# Patient Record
Sex: Female | Born: 1965 | Race: Black or African American | Hispanic: No | Smoking: Current every day smoker
Health system: Southern US, Community
[De-identification: ages and names within clinical notes are randomized; demographics above are authoritative.]

## PROBLEM LIST (undated history)

## (undated) DIAGNOSIS — M79672 Pain in left foot: Secondary | ICD-10-CM

## (undated) DIAGNOSIS — R51 Headache: Secondary | ICD-10-CM

## (undated) DIAGNOSIS — R519 Headache, unspecified: Secondary | ICD-10-CM

## (undated) DIAGNOSIS — M542 Cervicalgia: Secondary | ICD-10-CM

## (undated) DIAGNOSIS — M25519 Pain in unspecified shoulder: Secondary | ICD-10-CM

## (undated) DIAGNOSIS — I10 Essential (primary) hypertension: Secondary | ICD-10-CM

## (undated) DIAGNOSIS — G8929 Other chronic pain: Secondary | ICD-10-CM

## (undated) DIAGNOSIS — F40240 Claustrophobia: Secondary | ICD-10-CM

## (undated) DIAGNOSIS — M549 Dorsalgia, unspecified: Secondary | ICD-10-CM

## (undated) DIAGNOSIS — J40 Bronchitis, not specified as acute or chronic: Secondary | ICD-10-CM

## (undated) DIAGNOSIS — K089 Disorder of teeth and supporting structures, unspecified: Secondary | ICD-10-CM

## (undated) DIAGNOSIS — H9191 Unspecified hearing loss, right ear: Secondary | ICD-10-CM

## (undated) DIAGNOSIS — F319 Bipolar disorder, unspecified: Secondary | ICD-10-CM

## (undated) HISTORY — PX: LIGAMENT REPAIR: SHX5444

## (undated) HISTORY — PX: CHOLECYSTECTOMY: SHX55

---

## 2003-04-19 ENCOUNTER — Emergency Department (HOSPITAL_COMMUNITY): Admission: EM | Admit: 2003-04-19 | Discharge: 2003-04-19 | Payer: Self-pay | Admitting: Emergency Medicine

## 2004-04-20 ENCOUNTER — Emergency Department (HOSPITAL_COMMUNITY): Admission: EM | Admit: 2004-04-20 | Discharge: 2004-04-21 | Payer: Self-pay | Admitting: Emergency Medicine

## 2004-06-17 ENCOUNTER — Emergency Department (HOSPITAL_COMMUNITY): Admission: EM | Admit: 2004-06-17 | Discharge: 2004-06-17 | Payer: Self-pay | Admitting: Emergency Medicine

## 2004-07-08 ENCOUNTER — Inpatient Hospital Stay (HOSPITAL_COMMUNITY): Admission: AD | Admit: 2004-07-08 | Discharge: 2004-07-08 | Payer: Self-pay | Admitting: Obstetrics and Gynecology

## 2004-07-26 ENCOUNTER — Emergency Department (HOSPITAL_COMMUNITY): Admission: EM | Admit: 2004-07-26 | Discharge: 2004-07-26 | Payer: Self-pay | Admitting: Emergency Medicine

## 2004-09-26 ENCOUNTER — Inpatient Hospital Stay (HOSPITAL_COMMUNITY): Admission: AD | Admit: 2004-09-26 | Discharge: 2004-09-29 | Payer: Self-pay | Admitting: Psychiatry

## 2004-09-26 ENCOUNTER — Ambulatory Visit: Payer: Self-pay | Admitting: Psychiatry

## 2004-11-03 ENCOUNTER — Inpatient Hospital Stay (HOSPITAL_COMMUNITY): Admission: AD | Admit: 2004-11-03 | Discharge: 2004-11-03 | Payer: Self-pay | Admitting: Obstetrics

## 2004-11-25 ENCOUNTER — Inpatient Hospital Stay (HOSPITAL_COMMUNITY): Admission: RE | Admit: 2004-11-25 | Discharge: 2004-11-30 | Payer: Self-pay | Admitting: Obstetrics

## 2004-11-30 ENCOUNTER — Inpatient Hospital Stay (HOSPITAL_COMMUNITY): Admission: AD | Admit: 2004-11-30 | Discharge: 2004-12-07 | Payer: Self-pay | Admitting: Obstetrics & Gynecology

## 2005-02-15 ENCOUNTER — Inpatient Hospital Stay (HOSPITAL_COMMUNITY): Admission: AD | Admit: 2005-02-15 | Discharge: 2005-02-18 | Payer: Self-pay | Admitting: Obstetrics

## 2005-03-30 ENCOUNTER — Observation Stay (HOSPITAL_COMMUNITY): Admission: AD | Admit: 2005-03-30 | Discharge: 2005-03-31 | Payer: Self-pay | Admitting: Obstetrics

## 2005-04-28 ENCOUNTER — Inpatient Hospital Stay (HOSPITAL_COMMUNITY): Admission: AD | Admit: 2005-04-28 | Discharge: 2005-04-28 | Payer: Self-pay | Admitting: Obstetrics & Gynecology

## 2005-08-23 ENCOUNTER — Emergency Department (HOSPITAL_COMMUNITY): Admission: EM | Admit: 2005-08-23 | Discharge: 2005-08-23 | Payer: Self-pay | Admitting: Emergency Medicine

## 2005-10-12 ENCOUNTER — Emergency Department (HOSPITAL_COMMUNITY): Admission: EM | Admit: 2005-10-12 | Discharge: 2005-10-12 | Payer: Self-pay | Admitting: Emergency Medicine

## 2005-12-12 ENCOUNTER — Emergency Department (HOSPITAL_COMMUNITY): Admission: EM | Admit: 2005-12-12 | Discharge: 2005-12-12 | Payer: Self-pay | Admitting: Emergency Medicine

## 2006-08-31 ENCOUNTER — Encounter: Admission: RE | Admit: 2006-08-31 | Discharge: 2006-08-31 | Payer: Self-pay | Admitting: Orthopedic Surgery

## 2007-04-24 ENCOUNTER — Emergency Department (HOSPITAL_COMMUNITY): Admission: EM | Admit: 2007-04-24 | Discharge: 2007-04-24 | Payer: Self-pay | Admitting: Emergency Medicine

## 2007-08-19 ENCOUNTER — Emergency Department (HOSPITAL_COMMUNITY): Admission: EM | Admit: 2007-08-19 | Discharge: 2007-08-19 | Payer: Self-pay | Admitting: Emergency Medicine

## 2007-09-03 ENCOUNTER — Emergency Department (HOSPITAL_COMMUNITY): Admission: EM | Admit: 2007-09-03 | Discharge: 2007-09-03 | Payer: Self-pay | Admitting: Emergency Medicine

## 2008-01-01 ENCOUNTER — Emergency Department (HOSPITAL_COMMUNITY): Admission: EM | Admit: 2008-01-01 | Discharge: 2008-01-01 | Payer: Self-pay | Admitting: Emergency Medicine

## 2008-06-05 ENCOUNTER — Emergency Department (HOSPITAL_COMMUNITY): Admission: EM | Admit: 2008-06-05 | Discharge: 2008-06-05 | Payer: Self-pay | Admitting: Emergency Medicine

## 2008-06-18 ENCOUNTER — Encounter: Admission: RE | Admit: 2008-06-18 | Discharge: 2008-06-18 | Payer: Self-pay | Admitting: Orthopedic Surgery

## 2009-02-20 ENCOUNTER — Emergency Department (HOSPITAL_COMMUNITY): Admission: EM | Admit: 2009-02-20 | Discharge: 2009-02-20 | Payer: Self-pay | Admitting: Emergency Medicine

## 2009-04-29 ENCOUNTER — Emergency Department (HOSPITAL_COMMUNITY): Admission: EM | Admit: 2009-04-29 | Discharge: 2009-04-29 | Payer: Self-pay | Admitting: Emergency Medicine

## 2009-06-28 ENCOUNTER — Emergency Department (HOSPITAL_COMMUNITY): Admission: EM | Admit: 2009-06-28 | Discharge: 2009-06-28 | Payer: Self-pay | Admitting: Emergency Medicine

## 2009-07-15 ENCOUNTER — Ambulatory Visit (HOSPITAL_COMMUNITY): Admission: RE | Admit: 2009-07-15 | Discharge: 2009-07-15 | Payer: Self-pay | Admitting: Orthopedic Surgery

## 2009-11-04 ENCOUNTER — Emergency Department (HOSPITAL_COMMUNITY): Admission: EM | Admit: 2009-11-04 | Discharge: 2009-11-04 | Payer: Self-pay | Admitting: Emergency Medicine

## 2010-05-24 ENCOUNTER — Emergency Department (HOSPITAL_COMMUNITY)
Admission: EM | Admit: 2010-05-24 | Discharge: 2010-05-24 | Payer: Self-pay | Source: Home / Self Care | Admitting: Emergency Medicine

## 2010-05-25 LAB — URINE MICROSCOPIC-ADD ON

## 2010-05-25 LAB — CBC
HCT: 37.4 % (ref 36.0–46.0)
Hemoglobin: 12.6 g/dL (ref 12.0–15.0)
MCH: 28.5 pg (ref 26.0–34.0)
MCHC: 33.7 g/dL (ref 30.0–36.0)
MCV: 84.6 fL (ref 78.0–100.0)
Platelets: 302 10*3/uL (ref 150–400)
RBC: 4.42 MIL/uL (ref 3.87–5.11)
RDW: 13.4 % (ref 11.5–15.5)
WBC: 5.6 10*3/uL (ref 4.0–10.5)

## 2010-05-25 LAB — DIFFERENTIAL
Basophils Absolute: 0 10*3/uL (ref 0.0–0.1)
Basophils Relative: 0 % (ref 0–1)
Eosinophils Absolute: 0.2 10*3/uL (ref 0.0–0.7)
Eosinophils Relative: 4 % (ref 0–5)
Lymphocytes Relative: 55 % — ABNORMAL HIGH (ref 12–46)
Lymphs Abs: 3.1 10*3/uL (ref 0.7–4.0)
Monocytes Absolute: 0.5 10*3/uL (ref 0.1–1.0)
Monocytes Relative: 9 % (ref 3–12)
Neutro Abs: 1.8 10*3/uL (ref 1.7–7.7)
Neutrophils Relative %: 32 % — ABNORMAL LOW (ref 43–77)

## 2010-05-25 LAB — BASIC METABOLIC PANEL
BUN: 6 mg/dL (ref 6–23)
CO2: 23 mEq/L (ref 19–32)
Calcium: 8.4 mg/dL (ref 8.4–10.5)
Chloride: 109 mEq/L (ref 96–112)
Creatinine, Ser: 0.68 mg/dL (ref 0.4–1.2)
GFR calc Af Amer: >60 mL/min (ref >60)
GFR calc non Af Amer: >60 mL/min (ref >60)
Glucose, Bld: 99 mg/dL (ref 70–99)
Potassium: 3.4 mEq/L — ABNORMAL LOW (ref 3.5–5.1)
Sodium: 139 mEq/L (ref 135–145)

## 2010-05-25 LAB — URINALYSIS, ROUTINE W REFLEX MICROSCOPIC
Bilirubin Urine: NEGATIVE
Hgb urine dipstick: NEGATIVE
Ketones, ur: NEGATIVE mg/dL
Nitrite: NEGATIVE
Protein, ur: NEGATIVE mg/dL
Specific Gravity, Urine: 1.012 (ref 1.005–1.030)
Urine Glucose, Fasting: NEGATIVE mg/dL
Urobilinogen, UA: 0.2 mg/dL (ref 0.0–1.0)
pH: 6 (ref 5.0–8.0)

## 2010-05-29 ENCOUNTER — Encounter: Payer: Self-pay | Admitting: Orthopedic Surgery

## 2010-08-11 LAB — URINALYSIS, ROUTINE W REFLEX MICROSCOPIC
Glucose, UA: NEGATIVE mg/dL
Hgb urine dipstick: NEGATIVE
Ketones, ur: >80 mg/dL — AB
Nitrite: NEGATIVE
Protein, ur: NEGATIVE mg/dL
Specific Gravity, Urine: 1.025 (ref 1.005–1.030)
Urobilinogen, UA: 4 mg/dL — ABNORMAL HIGH (ref 0.0–1.0)
pH: 6.5 (ref 5.0–8.0)

## 2010-08-11 LAB — URINE MICROSCOPIC-ADD ON

## 2010-08-11 LAB — PREGNANCY, URINE: Preg Test, Ur: NEGATIVE

## 2010-08-11 LAB — URINE CULTURE: Colony Count: 6000

## 2010-09-23 NOTE — Discharge Summary (Signed)
[name], Barbara Alvarez               ACCOUNT NO.:  [id number]   MEDICAL RECORD NO.:  [id number]          PATIENT TYPE:  INP   LOCATION:  9152                          FACILITY:  [facility]   PHYSICIAN:  Roseanna Rainbow, M.D.DATE OF BIRTH:  01-31-1966   DATE OF ADMISSION:  11/30/2004  DATE OF DISCHARGE:  12/07/2004                                 DISCHARGE SUMMARY   CHIEF COMPLAINT:  The patient is a 45 year old gravida 8, para 6, with an  intrauterine pregnancy of 29 weeks complaining of uterine contractions.   HISTORY OF PRESENT ILLNESS:  Please see the above.   ALLERGIES:  HALDOL, AMBIEN.   MEDICATIONS:  1.  Prenatal vitamins.  2.  Zoloft.  3.  Colace.   SOCIAL HISTORY:  History of cannabis use.  She denies any alcohol usage.  She reports tobacco use.   OBSTETRICAL RISK FACTORS:  History of preterm labor during the current  pregnancy and also a current history of a preterm delivery.  Possible fetal  lower extremity deformity on ultrasound.  She has a history of bipolar  disorder.   PRENATAL LABORATORY DATA:  Antibody negative, RPR nonreactive, rubella  immune, hepatitis B surface antigen negative, HIV negative.   PAST MEDICAL HISTORY:  Please see the above.  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   PAST OB/GYN HISTORY:  She has a history of an ovarian cyst.   PAST SURGICAL HISTORY:  She denies.   On pelvic examination, the cervix was soft, closed, 50% effaced, with a  presenting part high.   ASSESSMENT:  1.  Intrauterine pregnancy at 29+ weeks.  2.  Preterm uterine contractions.   PLAN:  Admission, bed rest.   HOSPITAL COURSE:  The patient was admitted.  She was given magnesium sulfate  tocolysis.  An ultrasound on July 31 showed an AGA infant with a cervical  length of 1.5 cm and dilatation of the internal os to 1.2 cm.  The magnesium  sulfate was discontinued on August 1 as the patient was felt to be  adequately tocolyzed.  She remained stable  24 hours after the magnesium was  discontinued and was at this point discharged to home.   DISCHARGE DIAGNOSES:  Intrauterine pregnancy at 29+ weeks, threatened  preterm labor.   CONDITION ON DISCHARGE:  Stable.   DIET:  Regular.   ACTIVITY:  Bed rest, pelvic rest, medications resumed.   DISPOSITION:  The patient is to follow up in the office in several days.      Roseanna Rainbow, M.D.  Electronically Signed     LAJ/MEDQ  D:  03/22/2005  T:  03/23/2005  Job:  [id number]

## 2010-09-23 NOTE — Discharge Summary (Signed)
NAMEKLOI, BRODMAN NO.:  [id number]   MEDICAL RECORD NO.:  [id number]          PATIENT TYPE:  IPS   LOCATION:  0504                          FACILITY:  [facility]   PHYSICIAN:  Geoffery Lyons, M.D.      DATE OF BIRTH:  Dec 28, 1965   DATE OF ADMISSION:  09/26/2004  DATE OF DISCHARGE:  09/29/2004                                 DISCHARGE SUMMARY   CHIEF COMPLAINT/HISTORY OF PRESENT ILLNESS:  This was the first admission to  [facility] for this 45 year old single African-  American female involuntarily committed.  She has had a history of  depression since March.  She started crying at the OB/GYN.  She was  reporting depression.  She was sent total he emergency department, endorsed  she was under a lot of stress, has 5 children.  She is 5 months pregnant.  She is unable to work.  Electricity was cut off.  She reports being  overwhelmed, some paranoia positive auditory hallucinations to end her life,  off medications since March when she found out she was pregnant.   PAST PSYCHIATRIC HISTORY:  First time in [facility].  No  previous suicide attempts.  Three to four years prior to this admission for  depression.   ALCOHOL AND DRUG HISTORY:  Denies alcohol use.  Urine drug screen positive  for marijuana.   MEDICAL HISTORY:  She is 19-weeks pregnant.   MEDICATIONS:  1.  Zoloft.  2.  Seroquel stopped in March.   PHYSICAL EXAMINATION:  Performed and failed to show any acute findings other  than evidence of the pregnancy as already stated.   LABORATORY WORKUP:  Blood chemistries, white blood cells 5.0, hemoglobin  10.6.  Blood chemistries were within normal limits.  Glucose 80.  Liver  enzymes within normal limits.  TSH 0.799.  B12 level 449.  Folate 14.9.  HIV  nonreactive.  RPR nonreactive.   MENTAL STATUS EXAM:  Reveals an alert, cooperative female.  Speech clear and  articulate.  Able to carry on a conversation.  Mood depressed.   Affect  constricted.  Thought process logical, coherent and relevant.  Positive for  hallucinations but no suicidal ideas, no delusions.  Cognition well  preserved.   ADMISSION DIAGNOSES:  AXIS I:  Major depression, rule out psychotic  features.  AXIS II:  No diagnosis.  AXIS III:  A 80-month pregnancy.  AXIS IV:  Moderate.  AXIS V:  Upon admission 30, highest global assessment of function in the  last year 65.   COURSE IN HOSPITAL:  She was admitted.  She was started in individual and  group psychotherapy.  She was given Ambien for sleep.  She was given Haldol  and Cogentin as needed.  She was placed on prenatal vitamins 1 twice a day,  Colace 100 mg twice a day, Zoloft 25 mg per day.  More information from her  physician stated that she had been diagnosed with bipolar, depression,  __________ hurting herself and the children.  Initially endorsed being  easily complained of discomfort, nausea without vomiting, irritability  and  anxiety, constipation.  She was able to talk about the stressors, increased  depression, off medications since March.  She did endorse that she is having  a difficult time with the pregnancy, has 5 children, dealing with the  children, a lot of financial stress.  The pregnancy was not planned, but she  can accept it.  She is ready for the baby to come out.  She did endorse that  has suffered depression in her 3rd and 4th pregnancies.  Endorsed she has  also been psychotic.  She has heard voices.  She was treated with Haldol.  At the time of this evaluation she was denying and minimizing.  On Sep 29, 2004 she endorsed that  she went to the bathroom successfully which released  some of the pressure.  She endorsed she was hearing voices, endorsed she has  not heard voices or had ideas to hurt herself or the children.  Overall mood  was better.  Affect was brighter, broad.  Endorsing no suicidal or homicidal  ideations.  There was a family session with the  fiancee.  They both endorsed  improvement.  Endorsed difficulty with living on a limited budget.  Overall  she felt better.  The fiancee seemed to be supportive, had no safety  concerns.  She was wanting to go home.  She said she was ready to resume her  duties as being a mom, wanting to have this pregnancy and take care of  herself.  So, we went ahead and discharged the patient to outpatient  followup.   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  AXIS II:  No diagnoses.  AXIS III:  Pregnancy.  AXIS IV:  Moderate.  AXIS V:  Upon discharge 50.   DISCHARGE MEDICATIONS:  1.  Colace 100 mg 1 twice a day.  2.  Zoloft 25 mg per day with possible increase to 50.  3.  Prenatal vitamins.   FOLLOW UP:  Donnie Aho and Chyrel Masson, the therapist.       IL/MEDQ  D:  10/14/2004  T:  10/14/2004  Job:  [id number]

## 2010-09-23 NOTE — Discharge Summary (Signed)
Barbara Alvarez, Barbara Alvarez               ACCOUNT NO.:  [id number]   MEDICAL RECORD NO.:  [id number]          PATIENT TYPE:  INP   LOCATION:  9152                          [facility]ACILITY:  [facility]   PHYSICIAN:  Charles A. Clearance Coots, M.D.DATE OF BIRTH:  1966-02-14   DATE OF ADMISSION:  11/30/2004  DATE OF DISCHARGE:  11/30/2004                                 DISCHARGE SUMMARY   ADMISSION DIAGNOSES:  1.  Twenty-eight weeks gestation.  2.  Premature cervical changes.   DISCHARGE DIAGNOSES:  1.  Twenty-eight weeks gestation.  2.  Premature cervical change.  3.  Status post magnesium sulfate tocolysis with bedrest  and supportive management.   DISPOSITION:  Discharged home undelivered at 29-weeks gestation, in good  condition.   REASON FOR ADMISSION:  A 45 year old female para 5 with estimated date of  confinement of February 15, 2005, presented with complaint of leaking fluid.  Cervical changes were noted upon examination. On informal ultrasound the  cervix was approximately 2 cm in length with dynamic changes. The decision  was made to admit the patient to the hospital for preterm cervical changes  at 28-weeks gestation.   PAST MEDICAL HISTORY:  Bipolar disorder.   MEDICATIONS:  1.  Prenatal vitamins.  2.  Zoloft.  3.  Colace.   ALLERGIES:  No known drug allergies.   SOCIAL HISTORY:  Positive history of substance abuse with marijuana.  Positive tobacco use.   PHYSICAL EXAMINATION:  GENERAL:  Well-nourished, well-developed female in no  acute distress.  VITAL SIGNS:  Afebrile, vital signs were stable. Fetal heart rate was  reassuring with external fetal monitor. Toco revealed no uterine  contractions.  ABDOMEN:  Gravid, nontender.  PELVIC:  Cervix soft, 50% effaced, well-developed lower uterine segment.   IMPRESSION:  Multiparous female at 47-weeks gestation with premature  cervical changes.   PLAN:  Admit. Bedrest and tocolytic therapy.   ADMISSION LABORATORY VALUES:  Hemoglobin 10,  hematocrit 31, white blood cell  count 5200, platelets 304,000. RPR was nonreactive.   HOSPITAL COURSE:  The patient was admitted and placed on bedrest; started on  intravenous magnesium sulfate tocolysis. She responded well to therapy.  Formal ultrasound was done on hospital day number 2 and revealed the cervix  2 to 2.3 cm in length with slightly dynamic changes and funneling.  Amniotic  fluid index was 16.5 which was within normal limits. The patient continued  to respond well to tocolysis and bedrest and was discharged home on hospital  day number 5 with no uterine contractions and stable cervical exam.   DISPOSITION:   MEDICATIONS:  Continue prenatal vitamins.   ACTIVITY:  Continued modified bedrest at home. No sexual activity.   FOLLOW UP:  In the office in 1 week. The patient was given routine  instructions for preterm labor precautions.      Charles A. Clearance Coots, M.D.  Electronically Signed     CAH/MEDQ  D:  01/20/2005  T:  01/20/2005  Job:  [id number]

## 2010-09-23 NOTE — H&P (Signed)
NAMEADYSEN, RAPHAEL               ACCOUNT NO.:  [id number]   MEDICAL RECORD NO.:  [id number]          PATIENT TYPE:  INP   LOCATION:  9145                          [facility]ACILITY:  [facility]   PHYSICIAN:  Roseanna Rainbow, M.D.DATE OF BIRTH:  12-23-1965   DATE OF ADMISSION:  02/15/2005  DATE OF DISCHARGE:                                HISTORY & PHYSICAL   CHIEF COMPLAINT:  The patient is a 45 year old para 6 with an estimated date  of confinement of October 11 with an intrauterine pregnancy at 40 weeks  complaining of uterine contractions.   HISTORY OF PRESENT ILLNESS:  Please see the above.  She denies ruptured  membranes.  She reports good fetal movement.   ANTEPARTUM COURSE:  Problems, risks:  History of bipolar disorder,  schizophrenia, multiple personality disorder, history of COPD, tobacco use,  grand multiparity, advanced maternal age, history of recreational drug use.  Also with fetal limb anomaly.   SOCIAL HISTORY:  Currently smokes less than one-half pack per day.  Does not  give any significant history of alcohol usage.  Using marijuana.   PAST MEDICAL HISTORY:  Please see the above.   MEDICATIONS:  Zoloft, albuterol, prenatal vitamins.   ALLERGIES:  No known drug allergies.   FAMILY HISTORY:  No major illnesses known.   PAST SURGICAL HISTORY:  Cholecystectomy.   PAST OB/GYN HISTORY:  In July 1990 she was delivered of a female 8 pounds 1  ounce spontaneous vaginal delivery.  In October of 1987 she was delivered of  a female 7 pounds 14 ounces full-term spontaneous vaginal delivery.  In  December of 1990 she had an elective termination.  In December of 1994 she  was delivered of a 6 pound 14 ounce female spontaneous vaginal delivery.  In  November of 1995 she was delivered of a female 7 pounds 12 ounces spontaneous  vaginal delivery.  In October of 1998 she was delivered of a female 6 pounds  14 ounces spontaneous vaginal delivery.   PRENATAL SCREENS:  Pap smear within  normal limits.  Antibody screen  negative.  Hepatitis B surface antigen negative.  HIV negative.  Urine  culture and sensitivity no growth.  Gonorrhea negative.  Chlamydia negative.  Hepatitis B surface antigen negative.  HIV nonreactive.  Ultrasound at 19  weeks 1 day there was an echogenic intracardiac focus.  Foot was deviated  and medially curved.  Fetal echocardiogram was normal.   PHYSICAL EXAMINATION:  VITAL SIGNS:  Stable, afebrile.  Fetal heart rate  120s.  ABDOMEN:  Gravid.  PELVIC:  Sterile vaginal examination:  Cervix is 4 cm dilated, 50% effaced.   ASSESSMENT:  Grand multipara with an intrauterine pregnancy at term, early  labor.   PLAN:  Admission.  Expectant management.  Possible augmentation.      Roseanna Rainbow, M.D.  Electronically Signed     LAJ/MEDQ  D:  02/17/2005  T:  02/17/2005  Job:  [id number]

## 2010-12-22 ENCOUNTER — Encounter: Payer: Self-pay | Admitting: Internal Medicine

## 2010-12-22 ENCOUNTER — Emergency Department (HOSPITAL_COMMUNITY)
Admission: EM | Admit: 2010-12-22 | Discharge: 2010-12-22 | Disposition: A | Discharge status: Home / Self Care | Payer: Medicaid Other | Attending: Emergency Medicine | Admitting: Emergency Medicine

## 2010-12-22 DIAGNOSIS — M25579 Pain in unspecified ankle and joints of unspecified foot: Secondary | ICD-10-CM | POA: Insufficient documentation

## 2010-12-22 DIAGNOSIS — G8929 Other chronic pain: Secondary | ICD-10-CM | POA: Insufficient documentation

## 2010-12-22 DIAGNOSIS — M542 Cervicalgia: Secondary | ICD-10-CM | POA: Insufficient documentation

## 2010-12-22 DIAGNOSIS — F3130 Bipolar disorder, current episode depressed, mild or moderate severity, unspecified: Secondary | ICD-10-CM | POA: Insufficient documentation

## 2010-12-22 DIAGNOSIS — M25529 Pain in unspecified elbow: Secondary | ICD-10-CM | POA: Insufficient documentation

## 2010-12-22 DIAGNOSIS — M25539 Pain in unspecified wrist: Secondary | ICD-10-CM | POA: Insufficient documentation

## 2010-12-22 DIAGNOSIS — Z79899 Other long term (current) drug therapy: Secondary | ICD-10-CM | POA: Insufficient documentation

## 2010-12-22 DIAGNOSIS — Z9181 History of falling: Secondary | ICD-10-CM | POA: Insufficient documentation

## 2010-12-22 DIAGNOSIS — M25519 Pain in unspecified shoulder: Secondary | ICD-10-CM | POA: Insufficient documentation

## 2010-12-22 LAB — URINALYSIS, ROUTINE W REFLEX MICROSCOPIC
Bilirubin Urine: NEGATIVE
Leukocytes, UA: NEGATIVE
Nitrite: NEGATIVE
Protein, ur: NEGATIVE mg/dL
Specific Gravity, Urine: 1.013 (ref 1.005–1.030)
pH: 6 (ref 5.0–8.0)

## 2010-12-22 NOTE — Progress Notes (Signed)
Emergency department asking if this patient can be seen in our clinic. She has chronic pains, depression. She has Medicaid. The physician also say that this patient was once assigned to the outpatient clinic but has never followed up. She has Medicaid and has some would not accept her. Her phone number is [phone]. I have not seen this patient personally and did not have any further information about this patient.

## 2011-01-30 ENCOUNTER — Emergency Department (HOSPITAL_COMMUNITY): Payer: Medicaid Other

## 2011-01-30 ENCOUNTER — Emergency Department (HOSPITAL_COMMUNITY)
Admission: EM | Admit: 2011-01-30 | Discharge: 2011-01-30 | Disposition: A | Discharge status: Home / Self Care | Payer: Medicaid Other | Attending: Emergency Medicine | Admitting: Emergency Medicine

## 2011-01-30 DIAGNOSIS — J40 Bronchitis, not specified as acute or chronic: Secondary | ICD-10-CM | POA: Insufficient documentation

## 2011-01-30 DIAGNOSIS — S63509A Unspecified sprain of unspecified wrist, initial encounter: Secondary | ICD-10-CM | POA: Insufficient documentation

## 2011-01-30 DIAGNOSIS — R079 Chest pain, unspecified: Secondary | ICD-10-CM | POA: Insufficient documentation

## 2011-01-30 DIAGNOSIS — X58XXXA Exposure to other specified factors, initial encounter: Secondary | ICD-10-CM | POA: Insufficient documentation

## 2011-01-30 DIAGNOSIS — F17200 Nicotine dependence, unspecified, uncomplicated: Secondary | ICD-10-CM | POA: Insufficient documentation

## 2011-02-10 LAB — POCT PREGNANCY, URINE: Preg Test, Ur: NEGATIVE

## 2011-02-10 LAB — URINE CULTURE: Colony Count: 80000

## 2011-02-10 LAB — URINALYSIS, ROUTINE W REFLEX MICROSCOPIC
Hgb urine dipstick: NEGATIVE
Ketones, ur: 15 — AB
Nitrite: NEGATIVE
Urobilinogen, UA: 4 — ABNORMAL HIGH
pH: 6

## 2011-02-10 LAB — GC/CHLAMYDIA PROBE AMP, GENITAL
Chlamydia, DNA Probe: NEGATIVE
GC Probe Amp, Genital: NEGATIVE

## 2011-02-10 LAB — WET PREP, GENITAL: Trich, Wet Prep: NONE SEEN

## 2011-02-10 LAB — RPR: RPR Ser Ql: NONREACTIVE

## 2011-02-10 LAB — URINE MICROSCOPIC-ADD ON

## 2011-03-17 ENCOUNTER — Encounter: Payer: Self-pay | Admitting: *Deleted

## 2011-03-17 ENCOUNTER — Emergency Department (HOSPITAL_COMMUNITY)
Admission: EM | Admit: 2011-03-17 | Discharge: 2011-03-17 | Disposition: A | Discharge status: Home / Self Care | Payer: Medicaid Other | Attending: Emergency Medicine | Admitting: Emergency Medicine

## 2011-03-17 ENCOUNTER — Emergency Department (HOSPITAL_COMMUNITY): Payer: Medicaid Other

## 2011-03-17 DIAGNOSIS — R42 Dizziness and giddiness: Secondary | ICD-10-CM | POA: Insufficient documentation

## 2011-03-17 DIAGNOSIS — S335XXA Sprain of ligaments of lumbar spine, initial encounter: Secondary | ICD-10-CM | POA: Insufficient documentation

## 2011-03-17 DIAGNOSIS — M545 Low back pain, unspecified: Secondary | ICD-10-CM | POA: Insufficient documentation

## 2011-03-17 DIAGNOSIS — W010XXA Fall on same level from slipping, tripping and stumbling without subsequent striking against object, initial encounter: Secondary | ICD-10-CM | POA: Insufficient documentation

## 2011-03-17 DIAGNOSIS — S39012A Strain of muscle, fascia and tendon of lower back, initial encounter: Secondary | ICD-10-CM

## 2011-03-17 DIAGNOSIS — R51 Headache: Secondary | ICD-10-CM | POA: Insufficient documentation

## 2011-03-17 DIAGNOSIS — H53149 Visual discomfort, unspecified: Secondary | ICD-10-CM | POA: Insufficient documentation

## 2011-03-17 DIAGNOSIS — F319 Bipolar disorder, unspecified: Secondary | ICD-10-CM | POA: Insufficient documentation

## 2011-03-17 DIAGNOSIS — R110 Nausea: Secondary | ICD-10-CM | POA: Insufficient documentation

## 2011-03-17 HISTORY — DX: Bipolar disorder, unspecified: F31.9

## 2011-03-17 LAB — POCT I-STAT 3, VENOUS BLOOD GAS (G3P V)
Bicarbonate: 24.1 mEq/L — ABNORMAL HIGH (ref 20.0–24.0)
O2 Saturation: 52 %
Patient temperature: 98.2
TCO2: 25 mmol/L (ref 0–100)
pCO2, Ven: 38.2 mmHg — ABNORMAL LOW (ref 45.0–50.0)
pO2, Ven: 27 mmHg — CL (ref 30.0–45.0)

## 2011-03-17 LAB — CARBOXYHEMOGLOBIN: Carboxyhemoglobin: 5 % (ref 0.5–1.5)

## 2011-03-17 MED ORDER — IBUPROFEN 800 MG PO TABS: 800.0000 mg | ORAL_TABLET | Freq: Three times a day (TID) | ORAL | Status: AC

## 2011-03-17 MED ORDER — IBUPROFEN 800 MG PO TABS: 800.0000 mg | ORAL_TABLET | Freq: Three times a day (TID) | ORAL | Status: DC

## 2011-03-17 MED ORDER — METOCLOPRAMIDE HCL 5 MG/ML IJ SOLN
10.0000 mg | Freq: Once | INTRAMUSCULAR | Status: AC
  Administered 2011-03-17: 10 mg via INTRAMUSCULAR
  Filled 2011-03-17: qty 2

## 2011-03-17 MED ORDER — KETOROLAC TROMETHAMINE 60 MG/2ML IM SOLN
60.0000 mg | Freq: Once | INTRAMUSCULAR | Status: AC
  Administered 2011-03-17: 60 mg via INTRAMUSCULAR
  Filled 2011-03-17: qty 2

## 2011-03-17 MED ORDER — DIAZEPAM 5 MG PO TABS: 5.0000 mg | ORAL_TABLET | Freq: Two times a day (BID) | ORAL | Status: AC

## 2011-03-17 NOTE — ED Notes (Signed)
Patient states she continues to have a severe headache,  Pounding.  Will inform erpa

## 2011-03-17 NOTE — ED Notes (Signed)
Pt reports headache x 1.5 months with worsening symptoms yesterday. Pt reports photosensitivity and mild nausea and dizziness. States she thinks it is from the mold in her house. Pt reports lower back pain since last Tuesday, states twisted back while giving son a bath.

## 2011-03-17 NOTE — ED Notes (Signed)
Took patient a warm blanket

## 2011-03-17 NOTE — ED Provider Notes (Signed)
History     CSN: [id number] Arrival date & time: 03/17/2011  8:06 AM   First MD Initiated Contact with Patient 03/17/11 0827      Chief Complaint  Patient presents with  . Headache    (Consider location/radiation/quality/duration/timing/severity/associated sxs/prior treatment) HPI History provided by pt.   Pt reports that she has had intermittent, severe frontal headaches x 1.5 months.  Associated w/ phobophobia, dizziness upon standing and nausea.  Denies trauma and no prior h/o headaches.  Concerned that it may be related to mold in home because children have had headaches as well.  Denies nasal congestion, rhinorrhea, sore throat.  Uses space heaters only at home.  Denies fever, vision changes, extremity weakness, paresthesias and confusion.  Has also had pain in lower back for the past few days, since slipping on water and nearly falling.  No relief w/ ibuprofen.  Ambulatory.  Past Medical History  Diagnosis Date  . Bipolar 1 disorder     History reviewed. No pertinent past surgical history.  History reviewed. No pertinent family history.  History  Substance Use Topics  . Smoking status: Current Everyday Smoker    Types: Cigarettes  . Smokeless tobacco: Never Used  . Alcohol Use: Yes    OB History    Grav Para Term Preterm Abortions TAB SAB Ect Mult Living                  Review of Systems  All other systems reviewed and are negative.    Allergies  Haldol decanoate  Home Medications   Current Outpatient Rx  Name Route Sig Dispense Refill  . ALBUTEROL SULFATE HFA 108 (90 BASE) MCG/ACT IN AERS Inhalation Inhale 2 puffs into the lungs every 6 (six) hours as needed. For shortness of breath     . IBUPROFEN 200 MG PO TABS Oral Take 600 mg by mouth every 6 (six) hours as needed. For pain       BP 141/86  Pulse 58  Temp(Src) 98.2 F (36.8 C) (Oral)  Resp 14  SpO2 98%  Physical Exam  Nursing note and vitals reviewed. Constitutional: She is oriented to  person, place, and time. She appears well-developed and well-nourished. No distress.  HENT:  Head: Normocephalic and atraumatic. No trismus in the jaw.  Right Ear: Tympanic membrane, external ear and ear canal normal.  Left Ear: Tympanic membrane, external ear and ear canal normal.  Mouth/Throat: Uvula is midline and mucous membranes are normal. No oropharyngeal exudate, posterior oropharyngeal edema or posterior oropharyngeal erythema.  Eyes:       Pt will not open her eyes initially because "too painful" but when distracted, she looks directly into ceiling light.   Neck: Normal range of motion. Neck supple.       No meningeal signs  Cardiovascular: Normal rate and regular rhythm.   Pulmonary/Chest: Effort normal and breath sounds normal.  Musculoskeletal: Normal range of motion.       Diffuse lower back ttp  Lymphadenopathy:    She has no cervical adenopathy.  Neurological: She is alert and oriented to person, place, and time. She has normal reflexes. She displays normal reflexes. No cranial nerve deficit or sensory deficit. She displays a negative Romberg sign. Coordination and gait normal.       5/5 and equal upper and lower extremity strength.  No past pointing.  No pronator drift.    Skin: Skin is warm and dry. No rash noted.  Psychiatric: She has a normal mood and  affect. Her behavior is normal.    ED Course  Procedures (including critical care time)  Labs Reviewed  CARBOXYHEMOGLOBIN - Abnormal; Notable for the following:    Carboxyhemoglobin 5.0 (*)    All other components within normal limits  POCT I-STAT 3, BLOOD GAS (G3P V) - Abnormal; Notable for the following:    pH, Ven 7.407 (*)    pCO2, Ven 38.2 (*)    pO2, Ven 27.0 (*)    Bicarbonate 24.1 (*)    All other components within normal limits   Ct Head Wo Contrast  03/17/2011  *RADIOLOGY REPORT*  Clinical Data: Headache, weakness  CT HEAD WITHOUT CONTRAST  Technique:  Contiguous axial images were obtained from the base  of the skull through the vertex without contrast.  Comparison: None.  Findings: No skull fracture is noted.  Paranasal sinuses and mastoid air cells are unremarkable.  No intracranial hemorrhage, mass effect or midline shift.  No acute infarction.  No mass lesion is noted on this unenhanced scan.  No intra or extra-axial fluid collection.  The gray and white matter differentiation is preserved.  IMPRESSION: No acute intracranial abnormality.  Original Report Authenticated By: Natasha Mead, M.D.     1. Headache   2. Lumbar strain       MDM  Pt presents w/ c/o headache.  No prior history.  Several others in home w/ same.  No sx suggestive of sinusitis.  Afebrile, no focal neuro deficits or meningeal signs on exam.  CT head neg.  Carboxyhemoglobin neg as well.  VBG collected by phlebotomy in error.  All results discussed w/ pt.  Sx may be related to mold in home.  Recommended that she contact the health dept if her Landlord isn't complying w/ health codes.  Also c/o low back pain.  Onset several days ago when slipped in bathroom.  Did not fall.  No red flag s/sx.  Pt received toradol in ED for back pain and headache and reports no relief.   Ordered 10mg  IM reglan and discharged home w/ ibuprofen and valium.  Return precautions discussed.        Otilio Miu, [place] 03/17/11 1826

## 2011-03-17 NOTE — ED Notes (Signed)
See triage note. Pt reports her house in condemned because of the mold. Pt reports

## 2011-03-17 NOTE — ED Notes (Signed)
Patient has headache since yesterday.  She states she has to have help to take care of herself due to weakness.  She states her eyes can't stay open.  Patient also complains of back pain.  She states she has to tip toe.  She states she has mold in her house and she is concerned that this might be causing her sx

## 2011-03-23 NOTE — ED Provider Notes (Signed)
Medical screening examination/treatment/procedure(s) were performed by non-physician practitioner and as supervising physician I was immediately available for consultation/collaboration.  Raeford Razor, MD 03/23/11 [phone]

## 2011-06-21 ENCOUNTER — Other Ambulatory Visit: Payer: Self-pay | Admitting: Orthopaedic Surgery

## 2011-06-21 DIAGNOSIS — M545 Low back pain: Secondary | ICD-10-CM

## 2011-06-21 DIAGNOSIS — M25531 Pain in right wrist: Secondary | ICD-10-CM

## 2011-06-27 ENCOUNTER — Emergency Department (HOSPITAL_COMMUNITY)
Admission: EM | Admit: 2011-06-27 | Discharge: 2011-06-28 | Disposition: A | Discharge status: Home / Self Care | Payer: Medicaid Other | Attending: Emergency Medicine | Admitting: Emergency Medicine

## 2011-06-27 ENCOUNTER — Other Ambulatory Visit: Payer: Self-pay | Admitting: Orthopaedic Surgery

## 2011-06-27 DIAGNOSIS — I10 Essential (primary) hypertension: Secondary | ICD-10-CM | POA: Insufficient documentation

## 2011-06-27 DIAGNOSIS — T7840XA Allergy, unspecified, initial encounter: Secondary | ICD-10-CM

## 2011-06-27 DIAGNOSIS — M545 Low back pain: Secondary | ICD-10-CM

## 2011-06-27 DIAGNOSIS — R220 Localized swelling, mass and lump, head: Secondary | ICD-10-CM | POA: Insufficient documentation

## 2011-06-27 DIAGNOSIS — M25531 Pain in right wrist: Secondary | ICD-10-CM

## 2011-06-27 DIAGNOSIS — Y92009 Unspecified place in unspecified non-institutional (private) residence as the place of occurrence of the external cause: Secondary | ICD-10-CM | POA: Insufficient documentation

## 2011-06-27 DIAGNOSIS — Z79899 Other long term (current) drug therapy: Secondary | ICD-10-CM | POA: Insufficient documentation

## 2011-06-27 DIAGNOSIS — F319 Bipolar disorder, unspecified: Secondary | ICD-10-CM | POA: Insufficient documentation

## 2011-06-27 MED ORDER — PREDNISONE 20 MG PO TABS
40.0000 mg | ORAL_TABLET | Freq: Once | ORAL | Status: AC
  Administered 2011-06-27: 40 mg via ORAL
  Filled 2011-06-27: qty 2

## 2011-06-27 NOTE — ED Provider Notes (Signed)
History     CSN: [id number]  Arrival date & time 06/27/11  2248   First MD Initiated Contact with Patient 06/27/11 2258      No chief complaint on file.   (Consider location/radiation/quality/duration/timing/severity/associated sxs/prior treatment) HPI Comments: 46 year old female with a history of bipolar disorder who currently takes albuterol, Benadryl, epinephrine pen and ibuprofen who presents with a complaint of tongue swelling. She states that she woke earlier this morning with swelling of her tongue and a feeling of difficulty swallowing. She took an epinephrine pen but has not had any improvement in the swelling in her tongue. She believes this started after she awoke with a bug bite on the dorsum of the left forearm. Symptoms are persistent, moderate, nothing makes better or worse, not associated with fever chills nausea vomiting and she has had no rashes, redness, swelling of the arms or legs, change in vision or voice.  The history is provided by the patient.    Past Medical History  Diagnosis Date  . Bipolar 1 disorder     No past surgical history on file.  No family history on file.  History  Substance Use Topics  . Smoking status: Current Everyday Smoker    Types: Cigarettes  . Smokeless tobacco: Never Used  . Alcohol Use: Yes    OB History    Grav Para Term Preterm Abortions TAB SAB Ect Mult Living                  Review of Systems  All other systems reviewed and are negative.    Allergies  Haldol decanoate  Home Medications   Current Outpatient Rx  Name Route Sig Dispense Refill  . ALBUTEROL SULFATE HFA 108 (90 BASE) MCG/ACT IN AERS Inhalation Inhale 2 puffs into the lungs every 6 (six) hours as needed. For shortness of breath     . DIPHENHYDRAMINE HCL 25 MG PO CAPS Oral Take 25 mg by mouth every 6 (six) hours as needed.    Marland Kitchen EPINEPHRINE 0.15 MG/0.3ML IJ DEVI Intramuscular Inject 0.15 mg into the muscle as needed.    . IBUPROFEN 200 MG PO TABS  Oral Take 600 mg by mouth every 8 (eight) hours as needed. For pain    . PREDNISONE 20 MG PO TABS Oral Take 2 tablets (40 mg total) by mouth daily. 10 tablet 0    BP 119/53  Pulse 76  Resp 18  SpO2 100%  Physical Exam  Nursing note and vitals reviewed. Constitutional: She appears well-developed and well-nourished. No distress.  HENT:  Head: Normocephalic and atraumatic.  Mouth/Throat: Oropharynx is clear and moist. No oropharyngeal exudate.       Tongue is normal in appearance without angioedema or any swelling. Oropharynx is clear, no asymmetry hypertrophy exudates or erythema  Eyes: Conjunctivae and EOM are normal. Pupils are equal, round, and reactive to light. Right eye exhibits no discharge. Left eye exhibits no discharge. No scleral icterus.  Neck: Normal range of motion. Neck supple. No JVD present. No thyromegaly present.  Cardiovascular: Normal rate, regular rhythm, normal heart sounds and intact distal pulses.  Exam reveals no gallop and no friction rub.   No murmur heard. Pulmonary/Chest: Effort normal and breath sounds normal. No respiratory distress. She has no wheezes. She has no rales.  Abdominal: Soft. Bowel sounds are normal. She exhibits no distension and no mass. There is no tenderness.  Musculoskeletal: Normal range of motion. She exhibits no edema and no tenderness.  Lymphadenopathy:  She has no cervical adenopathy.  Neurological: She is alert. Coordination normal.  Skin: Skin is warm and dry. No rash noted. No erythema.  Psychiatric: She has a normal mood and affect. Her behavior is normal.    ED Course  Procedures (including critical care time)  Labs Reviewed - No data to display No results found.   1. Allergic reaction       MDM  Patient is well-appearing, there is no signs of redness or swelling for cellulitis or urticaria to the extremities including the location of which she describes as a bug bite. She has no swelling of her tongue, there are  no objective findings of anaphylaxis or other significant allergic reaction. She is not on any Ace inhibitors, prednisone ordered, patient appears stable for short observation and eventual followup.  After observation, pt has remained stable in appearance with O2 sat of 100%on ra, no swelling of the OP or the tongue, pruritis or urticaria.  Will d/c home with f/u.       Vida Roller, MD 06/30/11 Ebony Cargo

## 2011-06-27 NOTE — ED Notes (Signed)
Not able to get temp at this time patient per patient can't open her mouth
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
## 2011-06-28 ENCOUNTER — Other Ambulatory Visit: Payer: Medicaid Other

## 2011-06-28 MED ORDER — PREDNISONE 20 MG PO TABS: 20.0000 mg | ORAL_TABLET | Freq: Every day | ORAL | Status: DC

## 2011-06-28 MED ORDER — PREDNISONE 20 MG PO TABS: 40.0000 mg | ORAL_TABLET | Freq: Every day | ORAL | Status: AC

## 2011-06-28 NOTE — Discharge Instructions (Signed)
Allergic Reaction, Mild to Moderate Allergies may happen from anything your body is sensitive to. This may be food, medications, pollens, chemicals, and nearly anything around you in everyday life that produces allergens. An allergen is anything that causes an allergy producing substance. Allergens cause your body to release allergic antibodies. Through a chain of events, they cause a release of histamine into the blood stream. Histamines are meant to protect you, but they also cause your discomfort. This is why antihistamines are often used for allergies. Heredity is often a factor in causing allergic reactions. This means you may have some of the same allergies as your parents. Allergies happen in all age groups. You may have some idea of what caused your reaction. There are many allergens around us. It may be difficult to know what caused your reaction. If this is a first time event, it may never happen again. Allergies cannot be cured but can be controlled with medications. SYMPTOMS  You may get some or all of the following problems from allergies.  Swelling and itching in and around the mouth.   Tearing, itchy eyes.   Nasal congestion and runny nose.   Sneezing and coughing.   An itchy red rash or hives.   Vomiting or diarrhea.   Difficulty breathing.  Seasonal allergies occur in all age groups. They are seasonal because they usually occur during the same season every year. They may be a reaction to molds, grass pollens, or tree pollens. Other causes of allergies are house dust mite allergens, pet dander and mold spores. These are just a common few of the thousands of allergens around us. All of the symptoms listed above happen when you come in contact with pollens and other allergens. Seasonal allergies are usually not life threatening. They are generally more of a nuisance that can often be handled using medications. Hay fever is a combination of all or some of the above listed allergy  problems. It may often be treated with simple over-the-counter medications such as diphenhydramine. Take medication as directed. Check with your caregiver or package insert for child dosages. TREATMENT AND HOME CARE INSTRUCTIONS If hives or rash are present:  Take medications as directed.   You may use an over-the-counter antihistamine (diphenhydramine) for hives and itching as needed. Do not drive or drink alcohol until medications used to treat the reaction have worn off. Antihistamines tend to make people sleepy.   Apply cold cloths (compresses) to the skin or take baths in cool water. This will help itching. Avoid hot baths or showers. Heat will make a rash and itching worse.   If your allergies persist and become more severe, and over the counter medications are not effective, there are many new medications your caretaker can prescribe. Immunotherapy or desensitizing injections can be used if all else fails. Follow up with your caregiver if problems continue.  SEEK MEDICAL CARE IF:   Your allergies are becoming progressively more troublesome.   You suspect a food allergy. Symptoms generally happen within 30 minutes of eating a food.   Your symptoms have not gone away within 2 days or are getting worse.   You develop new symptoms.   You want to retest yourself or your child with a food or drink you think causes an allergic reaction. Never test yourself or your child of a suspected allergy without being under the watchful eye of your caregivers. A second exposure to an allergen may be life-threatening.  SEEK IMMEDIATE MEDICAL CARE IF:  You   develop difficulty breathing or wheezing, or have a tight feeling in your chest or throat.   You develop a swollen mouth, hives, swelling, or itching all over your body.  A severe reaction with any of the above problems should be considered life-threatening. If you suddenly develop difficulty breathing call for local emergency medical help. THIS IS AN  EMERGENCY. MAKE SURE YOU:   Understand these instructions.   Will watch your condition.   Will get help right away if you are not doing well or get worse.  Document Released: 02/19/2007 Document Revised: 01/04/2011 Document Reviewed: 02/19/2007 [facility] Patient Information 2012 [place], [place].  RESOURCE GUIDE  Dental Problems  Patients with Medicaid: [facility] [phone] [address]                                           [phone] [address] Phone:  [phone]                                                  Phone:  [phone]  If unable to pay or uninsured, contact:  [facility] or [facility]. to become qualified for the adult dental clinic.  Chronic Pain Problems Contact [facility]  [phone] Patients need to be referred by their primary care doctor.  Insufficient Money for Medicine Contact [employer]:  call "211" or [facility] 575-772-8282.  No Primary Care Doctor Call [facility]  [phone] Other agencies that provide inexpensive medical care    [facility]  [phone]    [facility]  [phone]    [facility]  [phone]    [facility]  [phone]    [facility]  [phone]    [facility]  [phone]  Psychological Services [facility]  [phone] [facility]  [phone] [facility]   [phone] (emergency services [phone])  Substance Abuse Resources [facility]  [phone] [facility] [phone] [facility] [phone] Floydene Flock 7703128429 Residential & Outpatient Substance Abuse Program  5878639079  Abuse/Neglect [facility] Child Abuse Hotline (410) 164-5580 [facility] Child Abuse Hotline (671)344-0484 (After Hours)  Emergency Shelter [facility] (239) 674-3727  [facility] (249) 288-9045 [facility] (705)052-7000  MRSA Hotline #:   939-458-9350    [facility] Resources  [facility]     [employer]                          [facility] Dept[facility] [address] [place]                       [address]      [address]  [address]  Sela Hua Phone:  [phone]                                   Phone:  [phone]                 Phone:  [place] Phone:  [place] [phone] (After Hours)

## 2011-07-06 ENCOUNTER — Ambulatory Visit
Admission: RE | Admit: 2011-07-06 | Discharge: 2011-07-06 | Disposition: A | Discharge status: Home / Self Care | Payer: Medicaid Other | Source: Ambulatory Visit | Attending: Orthopaedic Surgery | Admitting: Orthopaedic Surgery

## 2011-07-06 DIAGNOSIS — M25531 Pain in right wrist: Secondary | ICD-10-CM

## 2011-07-06 DIAGNOSIS — M545 Low back pain: Secondary | ICD-10-CM

## 2011-07-17 ENCOUNTER — Emergency Department (HOSPITAL_COMMUNITY): Payer: No Typology Code available for payment source

## 2011-07-17 ENCOUNTER — Encounter (HOSPITAL_COMMUNITY): Payer: Self-pay | Admitting: *Deleted

## 2011-07-17 ENCOUNTER — Emergency Department (HOSPITAL_COMMUNITY)
Admission: EM | Admit: 2011-07-17 | Discharge: 2011-07-17 | Disposition: A | Discharge status: Home / Self Care | Payer: No Typology Code available for payment source | Attending: Emergency Medicine | Admitting: Emergency Medicine

## 2011-07-17 DIAGNOSIS — T1490XA Injury, unspecified, initial encounter: Secondary | ICD-10-CM | POA: Insufficient documentation

## 2011-07-17 DIAGNOSIS — M542 Cervicalgia: Secondary | ICD-10-CM | POA: Insufficient documentation

## 2011-07-17 DIAGNOSIS — R079 Chest pain, unspecified: Secondary | ICD-10-CM | POA: Insufficient documentation

## 2011-07-17 DIAGNOSIS — M549 Dorsalgia, unspecified: Secondary | ICD-10-CM | POA: Insufficient documentation

## 2011-07-17 DIAGNOSIS — R51 Headache: Secondary | ICD-10-CM | POA: Insufficient documentation

## 2011-07-17 DIAGNOSIS — M25519 Pain in unspecified shoulder: Secondary | ICD-10-CM | POA: Insufficient documentation

## 2011-07-17 DIAGNOSIS — IMO0002 Reserved for concepts with insufficient information to code with codable children: Secondary | ICD-10-CM | POA: Insufficient documentation

## 2011-07-17 DIAGNOSIS — F411 Generalized anxiety disorder: Secondary | ICD-10-CM | POA: Insufficient documentation

## 2011-07-17 HISTORY — DX: Claustrophobia: F40.240

## 2011-07-17 HISTORY — DX: Bronchitis, not specified as acute or chronic: J40

## 2011-07-17 MED ORDER — CYCLOBENZAPRINE HCL 10 MG PO TABS: 10.0000 mg | ORAL_TABLET | Freq: Two times a day (BID) | ORAL | Status: DC | PRN

## 2011-07-17 MED ORDER — OXYCODONE-ACETAMINOPHEN 5-325 MG PO TABS
1.0000 | ORAL_TABLET | ORAL | Status: DC | PRN
Start: 2011-07-17 — End: 2011-07-20

## 2011-07-17 MED ORDER — CYCLOBENZAPRINE HCL 10 MG PO TABS
10.0000 mg | ORAL_TABLET | Freq: Once | ORAL | Status: AC
  Administered 2011-07-17: 10 mg via ORAL
  Filled 2011-07-17: qty 1

## 2011-07-17 MED ORDER — OXYCODONE-ACETAMINOPHEN 5-325 MG PO TABS
1.0000 | ORAL_TABLET | Freq: Once | ORAL | Status: AC
  Administered 2011-07-17: 1 via ORAL
  Filled 2011-07-17: qty 1

## 2011-07-17 MED ORDER — IBUPROFEN 800 MG PO TABS
800.0000 mg | ORAL_TABLET | Freq: Once | ORAL | Status: AC
Start: 2011-07-17 — End: 2011-07-17
  Administered 2011-07-17: 800 mg via ORAL
  Filled 2011-07-17: qty 1
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
## 2011-07-17 NOTE — Discharge Instructions (Signed)
Barbara Alvarez CT of your neck and  head  showed no abnormalities today. The right shoulder are also is normal. Take the Percocet and muscle relaxer but do not drop with these medications. Along with the other medications take ibuprofen 800mg  every 6 hours x24 with food.  You'll be sore for about a week. Use ice to the sore areas for the next 24 hours intermittently. All the new PCP this week. Return to the ER to have worsening symptoms shortness of breath chest pain.
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
## 2011-07-20 ENCOUNTER — Emergency Department (HOSPITAL_COMMUNITY)
Admission: EM | Admit: 2011-07-20 | Discharge: 2011-07-20 | Disposition: A | Discharge status: Home / Self Care | Payer: No Typology Code available for payment source | Attending: Emergency Medicine | Admitting: Emergency Medicine

## 2011-07-20 ENCOUNTER — Encounter (HOSPITAL_COMMUNITY): Payer: Self-pay | Admitting: *Deleted

## 2011-07-20 DIAGNOSIS — M25511 Pain in right shoulder: Secondary | ICD-10-CM

## 2011-07-20 DIAGNOSIS — F40298 Other specified phobia: Secondary | ICD-10-CM | POA: Insufficient documentation

## 2011-07-20 DIAGNOSIS — F319 Bipolar disorder, unspecified: Secondary | ICD-10-CM | POA: Insufficient documentation

## 2011-07-20 DIAGNOSIS — M436 Torticollis: Secondary | ICD-10-CM | POA: Insufficient documentation

## 2011-07-20 DIAGNOSIS — F17200 Nicotine dependence, unspecified, uncomplicated: Secondary | ICD-10-CM | POA: Insufficient documentation

## 2011-07-20 MED ORDER — DIAZEPAM 5 MG PO TABS: 10.0000 mg | ORAL_TABLET | Freq: Three times a day (TID) | ORAL | Status: DC | PRN

## 2011-07-20 MED ORDER — DIAZEPAM 5 MG PO TABS
10.0000 mg | ORAL_TABLET | Freq: Once | ORAL | Status: AC
  Administered 2011-07-20: 10 mg via ORAL
  Filled 2011-07-20: qty 2

## 2011-07-20 MED ORDER — DIAZEPAM 10 MG PO TABS: 10.0000 mg | ORAL_TABLET | Freq: Three times a day (TID) | ORAL | Status: AC | PRN
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
## 2011-07-20 NOTE — ED Provider Notes (Signed)
History/physical exam/procedure(s) were performed by non-physician practitioner and as supervising physician I was immediately available for consultation/collaboration. I have reviewed all notes and am in agreement with care and plan.   Hilario Quarry, MD 07/20/11 [phone]
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
## 2011-07-20 NOTE — Discharge Instructions (Signed)
You have a lot of spasm in your neck muscles. This is called Torticollis. Applying ice to the muscles will give temporary relief. You should continue taking your Ibuprofen three times a day, and continue taking the Percocet as needed for pain. Stop taking the Cyclobenzaprine - I am prescribing Valium which is a stronger muscle relaxer.  Torticollis, Acute You have suddenly (acutely) developed a twisted neck (torticollis). This is usually a self-limited condition. CAUSES  Acute torticollis may be caused by malposition, trauma or infection. Most commonly, acute torticollis is caused by sleeping in an awkward position. Torticollis may also be caused by the flexion, extension or twisting of the neck muscles beyond their normal position. Sometimes, the exact cause may not be known. SYMPTOMS  Usually, there is pain and limited movement of the neck. Your neck may twist to one side. DIAGNOSIS  The diagnosis is often made by physical examination. X-rays, CT scans or MRIs may be done if there is a history of trauma or concern of infection. TREATMENT  For a common, stiff neck that develops during sleep, treatment is focused on relaxing the contracted neck muscle. Medications (including shots) may be used to treat the problem. Most cases resolve in several days. Torticollis usually responds to conservative physical therapy. If left untreated, the shortened and spastic neck muscle can cause deformities in the face and neck. Rarely, surgery is required. HOME CARE INSTRUCTIONS   Use over-the-counter and prescription medications as directed by your caregiver.   Do stretching exercises and massage the neck as directed by your caregiver.   Follow up with physical therapy if needed and as directed by your caregiver.  SEEK IMMEDIATE MEDICAL CARE IF:   You develop difficulty breathing or noisy breathing (stridor).   You drool, develop trouble swallowing or have pain with swallowing.   You develop numbness or  weakness in the hands or feet.   You have changes in speech or vision.   You have problems with urination or bowel movements.   You have difficulty walking.   You have a fever.   You have increased pain.  MAKE SURE YOU:   Understand these instructions.   Will watch your condition.   Will get help right away if you are not doing well or get worse.  Document Released: 04/21/2000 Document Revised: 04/13/2011 Document Reviewed: 06/02/2009 [facility] Patient Information 2012 [place], [place].  Diazepam tablets What is this medicine? DIAZEPAM (dye AZ e pam) is a benzodiazepine. It is used to treat anxiety and nervousness. It also can help treat alcohol withdrawal, relax muscles, and treat certain types of seizures. This medicine may be used for other purposes; ask your health care provider or pharmacist if you have questions. What should I tell my health care provider before I take this medicine? They need to know if you have any of these conditions -an alcohol or drug abuse problem -bipolar disorder, depression, psychosis or other mental health condition -glaucoma -kidney or liver disease -lung or breathing disease -myasthenia gravis -Parkinson's disease -seizures or a history of seizures -suicidal thoughts -an unusual or allergic reaction to diazepam, other benzodiazepines, foods, dyes, or preservatives -pregnant or trying to get pregnant -breast-feeding How should I use this medicine? Take this medicine by mouth with a glass of water. Follow the directions on the prescription label. If this medicine upsets your stomach, take it with food or milk. Take your doses at regular intervals. Do not take your medicine more often than directed. If you have been taking this  medicine regularly for some time, do not suddenly stop taking it. You must gradually reduce the dose or you may get severe side effects. Ask your doctor or health care professional for advice. Even after you stop taking  this medicine it can still affect your body for several days. Talk to your pediatrician regarding the use of this medicine in children. Special care may be needed. Overdosage: If you think you have taken too much of this medicine contact a poison control center or emergency room at once. NOTE: This medicine is only for you. Do not share this medicine with others. What if I miss a dose? If you miss a dose, take it as soon as you can. If it is almost time for your next dose, take only that dose. Do not take double or extra doses. What may interact with this medicine? -cimetidine -grapefruit juice -herbal or dietary supplements like kava kava, melatonin, St. John's Wort, or valerian -medicines for anxiety or sleeping problems, like alprazolam, lorazepam, or triazolam -medicines for depression, mental problems or psychiatric disturbances -medicines for HIV infection or AIDS -prescription pain medicines -rifampin, rifapentine, or rifabutin -some medicines for seizures like carbamazepine, phenobarbital, phenytoin, or primidone This list may not describe all possible interactions. Give your health care provider a list of all the medicines, herbs, non-prescription drugs, or dietary supplements you use. Also tell them if you smoke, drink alcohol, or use illegal drugs. Some items may interact with your medicine. What should I watch for while using this medicine? Visit your doctor or health care professional for regular checks on your progress. Your body can become dependent on this medicine. Ask your doctor or health care professional if you still need to take it. You may get drowsy or dizzy. Do not drive, use machinery, or do anything that needs mental alertness until you know how this medicine affects you. To reduce the risk of dizzy and fainting spells, do not stand or sit up quickly, especially if you are an older patient. Alcohol may increase dizziness and drowsiness. Avoid alcoholic drinks. Do not  treat yourself for coughs, colds or allergies without asking your doctor or health care professional for advice. Some ingredients can increase possible side effects. What side effects may I notice from receiving this medicine? Side effects that you should report to your doctor or health care professional as soon as possible: -allergic reactions like skin rash, itching or hives, swelling of the face, lips, or tongue -angry, confused, depressed, other mood changes -breathing problems -feeling faint or lightheaded, falls -muscle cramps -problems with balance, talking, walking -restlessness -tremors -trouble passing urine or change in the amount of urine -unusually weak or tired Side effects that usually do not require medical attention (report to your doctor or health care professional if they continue or are bothersome): -difficulty sleeping, nightmares -dizziness, drowsiness, clumsiness, or unsteadiness, a hangover effect -headache -nausea, vomiting This list may not describe all possible side effects. Call your doctor for medical advice about side effects. You may report side effects to FDA at 1-800-FDA-1088. Where should I keep my medicine? Keep out of the reach of children. This medicine can be abused. Keep your medicine in a safe place to protect it from theft. Do not share this medicine with anyone. Selling or giving away this medicine is dangerous and against the law. Store at room temperature between 15 and 30 degrees C (59 and 86 degrees F). Protect from light. Keep container tightly closed. Throw away any unused medicine after the  expiration date. NOTE: This sheet is a summary. It may not cover all possible information. If you have questions about this medicine, talk to your doctor, pharmacist, or health care provider.  2012, [employer] Standard. (08/12/2007 4:57:35 PM)  Smoking Hazards Smoking cigarettes is extremely bad for your health. Tobacco smoke has over 200 known poisons  in it. There are over 60 chemicals in tobacco smoke that cause cancer. Some of the chemicals found in cigarette smoke include:   Cyanide.   Benzene.   Formaldehyde.   Methanol (wood alcohol).   Acetylene (fuel used in welding torches).   Ammonia.  Cigarette smoke also contains the poisonous gases nitrogen oxide and carbon monoxide.  Cigarette smokers have an increased risk of many serious medical problems, including:  Lung cancer.   Lung disease (such as pneumonia, bronchitis, and emphysema).   Heart attack and chest pain due to the heart not getting enough oxygen (angina).   Heart disease and peripheral blood vessel disease.   Hypertension.   Stroke.   Oral cancer (cancer of the lip, mouth, or voice box).   Bladder cancer.   Pancreatic cancer.   Cervical cancer.   Pregnancy complications, including premature birth.   Low birthweight babies.   Early menopause.   Lower estrogen level for women.   Infertility.   Facial wrinkles.   Blindness.   Increased risk of broken bones (fractures).   Senile dementia.   Stillbirths and smaller newborn babies, birth defects, and genetic damage to sperm.   Stomach ulcers and internal bleeding.  Children of smokers have an increased risk of the following, because of secondhand smoke exposure:   Sudden infant death syndrome (SIDS).   Respiratory infections.   Lung cancer.   Heart disease.   Ear infections.  Smoking causes approximately:  90% of all lung cancer deaths in men.   80% of all lung cancer deaths in women.   90% of deaths from chronic obstructive lung disease.  Compared with nonsmokers, smoking increases the risk of:  Coronary heart disease by 2 to 4 times.   Stroke by 2 to 4 times.   Men developing lung cancer by 23 times.   Women developing lung cancer by 13 times.   Dying from chronic obstructive lung diseases by 12 times.  Someone who smokes 2 packs a day loses about 8 years of his or  her life. Even smoking lightly shortens your life expectancy by several years. You can greatly reduce the risk of medical problems for you and your family by stopping now. Smoking is the most preventable cause of death and disease in our society. Within days of quitting smoking, your circulation returns to normal, you decrease the risk of having a heart attack, and your lung capacity improves. There may be some increased phlegm in the first few days after quitting, and it may take months for your lungs to clear up completely. Quitting for 10 years cuts your lung cancer risk to almost that of a nonsmoker. WHY IS SMOKING ADDICTIVE?  Nicotine is the chemical agent in tobacco that is capable of causing addiction or dependence.   When you smoke and inhale, nicotine is absorbed rapidly into the bloodstream through your lungs. Nicotine absorbed through the lungs is capable of creating a powerful addiction. Both inhaled and non-inhaled nicotine may be addictive.   Addiction studies of cigarettes and spit tobacco show that addiction to nicotine occurs mainly during the teen years, when young people begin using tobacco products.  WHAT ARE  THE BENEFITS OF QUITTING?  There are many health benefits to quitting smoking.   Likelihood of developing cancer and heart disease decreases. Health improvements are seen almost immediately.   Blood pressure, pulse rate, and breathing patterns start returning to normal soon after quitting.   People who quit may see an improvement in their overall quality of life.  Some people choose to quit all at once. Other options include nicotine replacement products, such as patches, gum, and nasal sprays. Do not use these products without first checking with your caregiver. QUITTING SMOKING It is not easy to quit smoking. Nicotine is addicting, and longtime habits are hard to change. To start, you can write down all your reasons for quitting, tell your family and friends you want to  quit, and ask for their help. Throw your cigarettes away, chew gum or cinnamon sticks, keep your hands busy, and drink extra water or juice. Go for walks and practice deep breathing to relax. Think of all the money you are saving: around $1,000 a year, for the average pack-a-day smoker. Nicotine patches and gum have been shown to improve success at efforts to stop smoking. Zyban (bupropion) is an anti-depressant drug that can be prescribed to reduce nicotine withdrawal symptoms and to suppress the urge to smoke. Smoking is an addiction with both physical and psychological effects. Joining a stop-smoking support group can help you cope with the emotional issues. For more information and advice on programs to stop smoking, call your doctor, your local hospital, or these organizations:  American Lung Association - 1-800-LUNGUSA   American Cancer Society - 1-800-ACS-2345  Document Released: 06/01/2004 Document Revised: 04/13/2011 Document Reviewed: 02/03/2009 [facility] Patient Information 2012 [place], [place].

## 2011-07-26 ENCOUNTER — Ambulatory Visit: Payer: No Typology Code available for payment source | Attending: Orthopaedic Surgery | Admitting: Physical Therapy

## 2011-07-26 DIAGNOSIS — IMO0001 Reserved for inherently not codable concepts without codable children: Secondary | ICD-10-CM | POA: Insufficient documentation

## 2011-07-26 DIAGNOSIS — M2560 Stiffness of unspecified joint, not elsewhere classified: Secondary | ICD-10-CM | POA: Insufficient documentation

## 2011-07-26 DIAGNOSIS — R293 Abnormal posture: Secondary | ICD-10-CM | POA: Insufficient documentation

## 2011-07-26 DIAGNOSIS — M542 Cervicalgia: Secondary | ICD-10-CM | POA: Insufficient documentation

## 2011-07-26 DIAGNOSIS — M25519 Pain in unspecified shoulder: Secondary | ICD-10-CM | POA: Insufficient documentation

## 2011-07-31 ENCOUNTER — Ambulatory Visit: Payer: No Typology Code available for payment source | Admitting: Physical Therapy

## 2011-08-02 ENCOUNTER — Ambulatory Visit: Payer: No Typology Code available for payment source | Admitting: Physical Therapy

## 2011-08-03 ENCOUNTER — Encounter: Payer: No Typology Code available for payment source | Admitting: Physical Therapy

## 2011-08-07 ENCOUNTER — Ambulatory Visit: Payer: No Typology Code available for payment source | Attending: Orthopaedic Surgery | Admitting: Physical Therapy

## 2011-08-07 DIAGNOSIS — R293 Abnormal posture: Secondary | ICD-10-CM | POA: Insufficient documentation

## 2011-08-07 DIAGNOSIS — IMO0001 Reserved for inherently not codable concepts without codable children: Secondary | ICD-10-CM | POA: Insufficient documentation

## 2011-08-07 DIAGNOSIS — M25519 Pain in unspecified shoulder: Secondary | ICD-10-CM | POA: Insufficient documentation

## 2011-08-07 DIAGNOSIS — M2560 Stiffness of unspecified joint, not elsewhere classified: Secondary | ICD-10-CM | POA: Insufficient documentation

## 2011-08-07 DIAGNOSIS — M542 Cervicalgia: Secondary | ICD-10-CM | POA: Insufficient documentation

## 2011-08-09 ENCOUNTER — Encounter: Payer: No Typology Code available for payment source | Admitting: Physical Therapy

## 2011-08-09 ENCOUNTER — Ambulatory Visit: Payer: No Typology Code available for payment source | Admitting: Physical Therapy

## 2011-08-10 ENCOUNTER — Other Ambulatory Visit: Payer: Self-pay | Admitting: Orthopaedic Surgery

## 2011-08-10 ENCOUNTER — Encounter: Payer: No Typology Code available for payment source | Admitting: Physical Therapy

## 2011-08-10 DIAGNOSIS — M542 Cervicalgia: Secondary | ICD-10-CM

## 2011-08-11 ENCOUNTER — Ambulatory Visit
Admission: RE | Admit: 2011-08-11 | Discharge: 2011-08-11 | Disposition: A | Discharge status: Home / Self Care | Payer: Medicaid Other | Source: Ambulatory Visit | Attending: Orthopaedic Surgery | Admitting: Orthopaedic Surgery

## 2011-08-11 DIAGNOSIS — M542 Cervicalgia: Secondary | ICD-10-CM

## 2011-08-23 ENCOUNTER — Encounter (HOSPITAL_COMMUNITY): Payer: Self-pay | Admitting: *Deleted

## 2011-08-23 ENCOUNTER — Emergency Department (HOSPITAL_COMMUNITY)
Admission: EM | Admit: 2011-08-23 | Discharge: 2011-08-23 | Disposition: A | Discharge status: Home / Self Care | Payer: Medicaid Other | Attending: Emergency Medicine | Admitting: Emergency Medicine

## 2011-08-23 DIAGNOSIS — R51 Headache: Secondary | ICD-10-CM | POA: Insufficient documentation

## 2011-08-23 DIAGNOSIS — Z77098 Contact with and (suspected) exposure to other hazardous, chiefly nonmedicinal, chemicals: Secondary | ICD-10-CM | POA: Insufficient documentation

## 2011-08-23 DIAGNOSIS — R42 Dizziness and giddiness: Secondary | ICD-10-CM | POA: Insufficient documentation

## 2011-08-23 LAB — POCT I-STAT TROPONIN I: Troponin i, poc: 0 ng/mL (ref 0.00–0.08)

## 2011-08-23 LAB — CARBOXYHEMOGLOBIN
Carboxyhemoglobin: 4 % — ABNORMAL HIGH (ref 0.5–1.5)
Methemoglobin: 1.5 % (ref 0.0–1.5)
O2 Saturation: 73.7 %
Total hemoglobin: 13.4 g/dL (ref 12.5–16.0)

## 2011-08-23 NOTE — ED Notes (Signed)
Pt states she walked into [facility] and 'smelled gas'. Pt states she was in building for about 5 minutes to buy a drink. States she went back to school and started feeling dizzy. States she took a benadryl and 'puff of my inhaler'. Respirations even and unlabored. Skin warm and dry. Appears in nad. Speech is clear. No airway swelling noted.

## 2011-08-23 NOTE — Discharge Instructions (Signed)
You have had normal lab findings with stable vital signs while in the Emergency department. If your symptoms persist it is recommended that you follow up with your primary care physician Dr. Daphine Deutscher. You are more then welcome to return to the ER if you develop any concerning symptoms including but not limited to: chest palpitations (heart beating faster then normal), irreproachable vomiting, or difficulty breathing.   Do not return to the area of the chemical exposure until authorities tell you it is safe. Chemical inhalation is often treated with observation. Observation may often be carried out at home. SEEK IMMEDIATE MEDICAL CARE IF:   You have a fever.   You have wheezing, difficulty breathing, continuous cough, nausea, or vomiting.   You have shortness of breath with your usual activities, or your heart seems to beat too fast with minimal exercise.   You become confused, irritable, or unusually sleepy. Have someone drive you to the emergency department or call 911. Do not drive yourself. A re-check will determine if hospitalization is needed for closer observation or treatment.  Document Released: 01/17/2001 Document Revised: 04/13/2011 Document Reviewed: 08/26/2007 [facility] Patient Information 2012 [place], [place].
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
## 2011-08-24 NOTE — ED Provider Notes (Signed)
Medical screening examination/treatment/procedure(s) were performed by non-physician practitioner and as supervising physician I was immediately available for consultation/collaboration.   Loren Racer, MD 08/24/11 0001

## 2011-12-11 ENCOUNTER — Emergency Department (HOSPITAL_COMMUNITY)
Admission: EM | Admit: 2011-12-11 | Discharge: 2011-12-11 | Disposition: A | Discharge status: Home Health | Payer: No Typology Code available for payment source | Attending: Emergency Medicine | Admitting: Emergency Medicine

## 2011-12-11 ENCOUNTER — Encounter (HOSPITAL_COMMUNITY): Payer: Self-pay | Admitting: Emergency Medicine

## 2011-12-11 DIAGNOSIS — S139XXA Sprain of joints and ligaments of unspecified parts of neck, initial encounter: Secondary | ICD-10-CM | POA: Insufficient documentation

## 2011-12-11 DIAGNOSIS — F17200 Nicotine dependence, unspecified, uncomplicated: Secondary | ICD-10-CM | POA: Insufficient documentation

## 2011-12-11 DIAGNOSIS — Z888 Allergy status to other drugs, medicaments and biological substances status: Secondary | ICD-10-CM | POA: Insufficient documentation

## 2011-12-11 DIAGNOSIS — S161XXA Strain of muscle, fascia and tendon at neck level, initial encounter: Secondary | ICD-10-CM

## 2011-12-11 DIAGNOSIS — Z79899 Other long term (current) drug therapy: Secondary | ICD-10-CM | POA: Insufficient documentation

## 2011-12-11 DIAGNOSIS — F319 Bipolar disorder, unspecified: Secondary | ICD-10-CM | POA: Insufficient documentation

## 2011-12-11 MED ORDER — TRAMADOL HCL 50 MG PO TABS: 50.0000 mg | ORAL_TABLET | Freq: Four times a day (QID) | ORAL | Status: DC | PRN
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
## 2011-12-14 ENCOUNTER — Ambulatory Visit: Payer: No Typology Code available for payment source | Attending: Anesthesiology

## 2011-12-14 DIAGNOSIS — R293 Abnormal posture: Secondary | ICD-10-CM | POA: Insufficient documentation

## 2011-12-14 DIAGNOSIS — IMO0001 Reserved for inherently not codable concepts without codable children: Secondary | ICD-10-CM | POA: Insufficient documentation

## 2011-12-14 DIAGNOSIS — M25519 Pain in unspecified shoulder: Secondary | ICD-10-CM | POA: Insufficient documentation

## 2011-12-14 DIAGNOSIS — M542 Cervicalgia: Secondary | ICD-10-CM | POA: Insufficient documentation

## 2011-12-14 DIAGNOSIS — M2569 Stiffness of other specified joint, not elsewhere classified: Secondary | ICD-10-CM | POA: Insufficient documentation

## 2011-12-14 DIAGNOSIS — M2550 Pain in unspecified joint: Secondary | ICD-10-CM | POA: Insufficient documentation

## 2011-12-18 ENCOUNTER — Ambulatory Visit: Payer: No Typology Code available for payment source

## 2011-12-20 ENCOUNTER — Ambulatory Visit: Payer: No Typology Code available for payment source

## 2011-12-21 ENCOUNTER — Emergency Department (HOSPITAL_COMMUNITY)
Admission: EM | Admit: 2011-12-21 | Discharge: 2011-12-21 | Disposition: A | Discharge status: Home / Self Care | Payer: Medicaid Other | Attending: Emergency Medicine | Admitting: Emergency Medicine

## 2011-12-21 ENCOUNTER — Emergency Department (HOSPITAL_COMMUNITY): Payer: Medicaid Other

## 2011-12-21 ENCOUNTER — Encounter (HOSPITAL_COMMUNITY): Payer: Self-pay | Admitting: Adult Health

## 2011-12-21 DIAGNOSIS — M545 Low back pain, unspecified: Secondary | ICD-10-CM | POA: Insufficient documentation

## 2011-12-21 DIAGNOSIS — M549 Dorsalgia, unspecified: Secondary | ICD-10-CM

## 2011-12-21 DIAGNOSIS — F319 Bipolar disorder, unspecified: Secondary | ICD-10-CM | POA: Insufficient documentation

## 2011-12-21 DIAGNOSIS — R0789 Other chest pain: Secondary | ICD-10-CM

## 2011-12-21 DIAGNOSIS — W2209XA Striking against other stationary object, initial encounter: Secondary | ICD-10-CM | POA: Insufficient documentation

## 2011-12-21 DIAGNOSIS — S20219A Contusion of unspecified front wall of thorax, initial encounter: Secondary | ICD-10-CM | POA: Insufficient documentation

## 2011-12-21 DIAGNOSIS — F17200 Nicotine dependence, unspecified, uncomplicated: Secondary | ICD-10-CM | POA: Insufficient documentation

## 2011-12-21 MED ORDER — OXYCODONE-ACETAMINOPHEN 5-325 MG PO TABS
2.0000 | ORAL_TABLET | Freq: Once | ORAL | Status: AC
  Administered 2011-12-21: 2 via ORAL
  Filled 2011-12-21: qty 2

## 2011-12-21 MED ORDER — BUPIVACAINE HCL 0.25 % IJ SOLN: 5.0000 mL | Freq: Once | INTRAMUSCULAR | Status: DC

## 2011-12-21 MED ORDER — IBUPROFEN 400 MG PO TABS: 800.0000 mg | ORAL_TABLET | Freq: Once | ORAL | Status: DC

## 2011-12-21 MED ORDER — NAPROXEN 500 MG PO TABS: 500.0000 mg | ORAL_TABLET | Freq: Two times a day (BID) | ORAL | Status: DC | PRN

## 2011-12-21 MED ORDER — IBUPROFEN 400 MG PO TABS
400.0000 mg | ORAL_TABLET | Freq: Once | ORAL | Status: AC
  Administered 2011-12-21: 400 mg via ORAL
  Filled 2011-12-21: qty 1
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
## 2011-12-21 NOTE — ED Provider Notes (Signed)
History  Scribed for Raeford Razor, MD, the patient was seen in room TR10C/TR10C. This chart was scribed by Candelaria Stagers. The patient's care started at 4:11 PM   CSN: [id number]  Arrival date & time 12/21/11  1520   First MD Initiated Contact with Patient 12/21/11 1546      Chief Complaint  Patient presents with  . Rib Injury     The history is provided by the patient.   Barbara Alvarez is a 46 y.o. female who presents to the Emergency Department complaining of right sided rib pain after hitting her side on a bench as she sat down earlier today.  Pt states the pain is worse with inspiration.  She is also experiencing lower right back pain.   Past Medical History  Diagnosis Date  . Bipolar 1 disorder   . Claustrophobia   . Bronchitis     History reviewed. No pertinent past surgical history.  History reviewed. No pertinent family history.  History  Substance Use Topics  . Smoking status: Current Everyday Smoker    Types: Cigarettes  . Smokeless tobacco: Never Used  . Alcohol Use: Yes    OB History    Grav Para Term Preterm Abortions TAB SAB Ect Mult Living                  Review of Systems  Constitutional: Negative for fever.       10 Systems reviewed and are negative for acute change except as noted in the HPI.  HENT: Negative for congestion.   Eyes: Negative for discharge and redness.  Respiratory: Negative for cough and shortness of breath.   Cardiovascular: Negative for chest pain.  Gastrointestinal: Negative for vomiting and abdominal pain.  Musculoskeletal: Positive for back pain.       Right sided rib pain.   Skin: Negative for rash.  Neurological: Negative for syncope, numbness and headaches.  Psychiatric/Behavioral:       No behavior change.    Allergies  Haloperidol decanoate  Home Medications   Current Outpatient Rx  Name Route Sig Dispense Refill  . ALBUTEROL SULFATE HFA 108 (90 BASE) MCG/ACT IN AERS Inhalation Inhale 2 puffs into the  lungs every 6 (six) hours as needed. For shortness of breath    . ALPRAZOLAM 1 MG PO TABS Oral Take 1 mg by mouth at bedtime as needed. For nerves    . CITALOPRAM HYDROBROMIDE 10 MG PO TABS Oral Take 10 mg by mouth at bedtime.    Marland Kitchen DIPHENHYDRAMINE HCL 25 MG PO CAPS Oral Take 25 mg by mouth every 6 (six) hours as needed. allergies    . EPINEPHRINE 0.3 MG/0.3ML IJ DEVI Intramuscular Inject 0.3 mg into the muscle as needed. For allergic reactions    . IBUPROFEN 800 MG PO TABS Oral Take 800 mg by mouth every 8 (eight) hours as needed. For pain    . LISINOPRIL 10 MG PO TABS Oral Take 10 mg by mouth daily.    . TRAMADOL HCL 50 MG PO TABS Oral Take 1 tablet (50 mg total) by mouth every 6 (six) hours as needed for pain. 15 tablet 0    BP 117/68  Pulse 87  Temp 98.7 F (37.1 C) (Oral)  Resp 16  SpO2 100%  LMP 11/29/2011  Physical Exam  Nursing note and vitals reviewed. Constitutional:       Awake, alert, nontoxic appearance.  HENT:  Head: Atraumatic.  Eyes: Right eye exhibits no discharge. Left eye exhibits  no discharge.  Neck: Neck supple.  Pulmonary/Chest: Effort normal. She exhibits no tenderness.  Abdominal: Soft. There is no tenderness. There is no rebound.  Musculoskeletal: She exhibits no tenderness.       Tenderness along the 10th and 11th ribs posterior right side.  Underlying skin normal.  No crepitous.  No midline spinal tenderness.  Moderate paraspinal tenderness of the lumbar region on the right side.    Neurological:       Mental status and motor strength appears baseline for patient and situation.  Skin: No rash noted.  Psychiatric: She has a normal mood and affect.    ED Course  Procedures   DIAGNOSTIC STUDIES: Oxygen Saturation is 100% on room air, normal by my interpretation.    COORDINATION OF CARE:   1540 DG Ribs Unilateral W/Chest Right   Labs Reviewed - No data to display Dg Ribs Unilateral W/chest Right  12/21/2011  *RADIOLOGY REPORT*  Clinical Data:  Traumatic injury with right rib pain  RIGHT RIBS AND CHEST - 3+ VIEW  Comparison: 07/17/2011  Findings: The cardiac shadow is within normal limits.  The lungs are clear bilaterally.  No pneumothorax is seen.  No acute displaced or deforming rib fracture is noted.  IMPRESSION: No acute abnormality.  Original Report Authenticated By: Phillips Odor, M.D.     1. Chest wall pain   2. Back pain       MDM  46 year old female with chest wall contusion. No crepitus. Breath sounds are symmetric. Imaging without evidence of pneumothorax, pulmonary contusion or rib fracture. No respiratory distress on exam. Plan symptomatic treatment. Return cautions were discussed.  I personally preformed the services scribed in my presence. The recorded information has been reviewed and considered. Raeford Razor, MD.         Raeford Razor, MD 12/29/11 [phone]

## 2011-12-25 ENCOUNTER — Ambulatory Visit: Payer: No Typology Code available for payment source

## 2011-12-28 ENCOUNTER — Ambulatory Visit: Payer: No Typology Code available for payment source

## 2012-01-03 ENCOUNTER — Emergency Department (HOSPITAL_COMMUNITY)
Admission: EM | Admit: 2012-01-03 | Discharge: 2012-01-03 | Disposition: A | Discharge status: Home / Self Care | Payer: Medicaid Other | Attending: Emergency Medicine | Admitting: Emergency Medicine

## 2012-01-03 ENCOUNTER — Ambulatory Visit: Payer: No Typology Code available for payment source

## 2012-01-03 ENCOUNTER — Encounter (HOSPITAL_COMMUNITY): Payer: Self-pay | Admitting: *Deleted

## 2012-01-03 DIAGNOSIS — J329 Chronic sinusitis, unspecified: Secondary | ICD-10-CM

## 2012-01-03 DIAGNOSIS — F17200 Nicotine dependence, unspecified, uncomplicated: Secondary | ICD-10-CM | POA: Insufficient documentation

## 2012-01-03 DIAGNOSIS — Z888 Allergy status to other drugs, medicaments and biological substances status: Secondary | ICD-10-CM | POA: Insufficient documentation

## 2012-01-03 DIAGNOSIS — F319 Bipolar disorder, unspecified: Secondary | ICD-10-CM | POA: Insufficient documentation

## 2012-01-03 DIAGNOSIS — E860 Dehydration: Secondary | ICD-10-CM | POA: Insufficient documentation

## 2012-01-03 LAB — CBC WITH DIFFERENTIAL/PLATELET
Basophils Relative: 1 % (ref 0–1)
Hemoglobin: 14.5 g/dL (ref 12.0–15.0)
Lymphs Abs: 2.6 10*3/uL (ref 0.7–4.0)
MCHC: 34.7 g/dL (ref 30.0–36.0)
Monocytes Relative: 12 % (ref 3–12)
Neutro Abs: 2.9 10*3/uL (ref 1.7–7.7)
Neutrophils Relative %: 44 % (ref 43–77)
RBC: 4.9 MIL/uL (ref 3.87–5.11)

## 2012-01-03 LAB — BASIC METABOLIC PANEL
BUN: 9 mg/dL (ref 6–23)
Chloride: 98 mEq/L (ref 96–112)
GFR calc Af Amer: >90 mL/min (ref >90)
Potassium: 3.5 mEq/L (ref 3.5–5.1)
Sodium: 135 mEq/L (ref 135–145)

## 2012-01-03 MED ORDER — AMOXICILLIN-POT CLAVULANATE 500-125 MG PO TABS: 1.0000 | ORAL_TABLET | Freq: Three times a day (TID) | ORAL | Status: AC

## 2012-01-03 MED ORDER — SODIUM CHLORIDE 0.9 % IV BOLUS (SEPSIS)
1000.0000 mL | Freq: Once | INTRAVENOUS | Status: AC
  Administered 2012-01-03: 1000 mL via INTRAVENOUS

## 2012-01-03 NOTE — ED Notes (Signed)
Pt reports nasal drainage, productive cough, and headache for approx a week.

## 2012-01-03 NOTE — ED Provider Notes (Signed)
History   This chart was scribed for Nelia Shi, MD by Sofie Rower. The patient was seen in room TR08C/TR08C and the patient's care was started at 3:25PM    CSN: [id number]  Arrival date & time 01/03/12  1425   First MD Initiated Contact with Patient 01/03/12 1515      Chief Complaint  Patient presents with  . Nasal Congestion  . Headache    (Consider location/radiation/quality/duration/timing/severity/associated sxs/prior treatment) Patient is a 46 y.o. female presenting with headaches and cough. The history is provided by the patient. No language interpreter was used.  Headache  This is a new problem. The current episode started more than 2 days ago. The problem occurs constantly. The problem has been gradually worsening. The headache is associated with an unknown factor. The quality of the pain is described as throbbing. The pain is moderate. The pain radiates to the face. Pertinent negatives include no fever, no chest pressure, no syncope, no shortness of breath, no nausea and no vomiting.  Cough This is a new problem. The current episode started more than 2 days ago. The problem occurs constantly. The problem has been gradually worsening. The cough is productive of sputum. There has been no fever. Associated symptoms include headaches. Pertinent negatives include no chills, no ear pain and no shortness of breath. She has tried nothing for the symptoms. The treatment provided no relief. She is a smoker.    Barbara Alvarez is a 46 y.o. female who presents to the Emergency Department complaining of  sudden, progressively worsening, nasal congestion onset one week ago with associated symptoms of rhinorrhea, productive dark yellow cough, back pain, and headache. The pt reports she has not been able to eat for the past three days and feels as if her head is pounding. The pt informs that when she coughs, she is experiencing a radiating pain which travels down to her lower back. The pt has a  hx of allergies.   The pt denies experiencing any fever.  The pt is a current everyday smoker and drinks alcohol.   PCP is Dr. Daphine Deutscher.    Past Medical History  Diagnosis Date  . Bipolar 1 disorder   . Claustrophobia   . Bronchitis     History reviewed. No pertinent past surgical history.  History reviewed. No pertinent family history.  History  Substance Use Topics  . Smoking status: Current Everyday Smoker    Types: Cigarettes  . Smokeless tobacco: Never Used  . Alcohol Use: Yes    OB History    Grav Para Term Preterm Abortions TAB SAB Ect Mult Living                  Review of Systems  Constitutional: Negative for fever and chills.  HENT: Negative for ear pain.   Respiratory: Positive for cough. Negative for shortness of breath.   Cardiovascular: Negative for syncope.  Gastrointestinal: Negative for nausea and vomiting.  Neurological: Positive for headaches.  All other systems reviewed and are negative.    Allergies  Haloperidol decanoate  Home Medications   Current Outpatient Rx  Name Route Sig Dispense Refill  . ALBUTEROL SULFATE HFA 108 (90 BASE) MCG/ACT IN AERS Inhalation Inhale 2 puffs into the lungs every 6 (six) hours as needed. For shortness of breath    . CITALOPRAM HYDROBROMIDE 10 MG PO TABS Oral Take 10 mg by mouth at bedtime.    Marland Kitchen DIPHENHYDRAMINE HCL 25 MG PO CAPS Oral Take 25 mg  by mouth every 6 (six) hours as needed. allergies    . EPINEPHRINE 0.3 MG/0.3ML IJ DEVI Intramuscular Inject 0.3 mg into the muscle as needed. For allergic reactions    . IBUPROFEN 800 MG PO TABS Oral Take 800 mg by mouth every 8 (eight) hours as needed. For pain    . LISINOPRIL 10 MG PO TABS Oral Take 10 mg by mouth daily.    Marland Kitchen NAPROXEN 500 MG PO TABS Oral Take 500 mg by mouth 2 (two) times daily as needed. For pain    . AMOXICILLIN-POT CLAVULANATE 500-125 MG PO TABS Oral Take 1 tablet (500 mg total) by mouth every 8 (eight) hours. 21 tablet 0    BP 113/83  Pulse  116  Temp 98.6 F (37 C) (Oral)  Resp 18  SpO2 100%  LMP 11/29/2011  Physical Exam  Nursing note and vitals reviewed. Constitutional: She is oriented to person, place, and time. She appears well-developed. No distress.  HENT:  Head: Normocephalic and atraumatic.  Eyes: Pupils are equal, round, and reactive to light.  Neck: Normal range of motion. No rigidity. Normal range of motion present. No Brudzinski's sign and no Kernig's sign noted.  Cardiovascular: Normal rate and intact distal pulses.   Pulmonary/Chest: No respiratory distress.  Abdominal: Normal appearance. She exhibits no distension.  Musculoskeletal: Normal range of motion.  Neurological: She is alert and oriented to person, place, and time. No cranial nerve deficit.  Skin: Skin is warm and dry. No rash noted.  Psychiatric: She has a normal mood and affect. Her behavior is normal.    ED Course  Procedures (including critical care time)  DIAGNOSTIC STUDIES: Oxygen Saturation is 100% on room air, normal by my interpretation.    COORDINATION OF CARE:   3:28PM- Antibiotics and sinus infection discussed. Pt agrees with treatment.   5:48PM- Recheck. Dehydration and discharge discussed.    Labs Reviewed  CBC WITH DIFFERENTIAL - Abnormal; Notable for the following:    Platelets 411 (*)     All other components within normal limits  BASIC METABOLIC PANEL  LAB REPORT - SCANNED   No results found.   1. Sinusitis   2. Dehydration       MDM         I personally performed the services described in this documentation, which was scribed in my presence. The recorded information has been reviewed and considered.    Nelia Shi, MD 01/05/12 1147

## 2012-01-03 NOTE — ED Notes (Signed)
Pt reports headache and back pain for over 1 week.  Pt states she has severe back pain from coughing a lot of mucus.  Pt reports headache 10/10.  Pt alert oriented X4

## 2012-01-12 ENCOUNTER — Ambulatory Visit: Payer: No Typology Code available for payment source | Attending: Anesthesiology

## 2012-01-12 DIAGNOSIS — M25519 Pain in unspecified shoulder: Secondary | ICD-10-CM | POA: Insufficient documentation

## 2012-01-12 DIAGNOSIS — R293 Abnormal posture: Secondary | ICD-10-CM | POA: Insufficient documentation

## 2012-01-12 DIAGNOSIS — M542 Cervicalgia: Secondary | ICD-10-CM | POA: Insufficient documentation

## 2012-01-12 DIAGNOSIS — M2550 Pain in unspecified joint: Secondary | ICD-10-CM | POA: Insufficient documentation

## 2012-01-12 DIAGNOSIS — M2569 Stiffness of other specified joint, not elsewhere classified: Secondary | ICD-10-CM | POA: Insufficient documentation

## 2012-01-12 DIAGNOSIS — IMO0001 Reserved for inherently not codable concepts without codable children: Secondary | ICD-10-CM | POA: Insufficient documentation

## 2012-04-18 ENCOUNTER — Encounter (HOSPITAL_COMMUNITY): Payer: Self-pay

## 2012-04-18 ENCOUNTER — Emergency Department (HOSPITAL_COMMUNITY)
Admission: EM | Admit: 2012-04-18 | Discharge: 2012-04-18 | Disposition: A | Discharge status: Home / Self Care | Payer: Medicaid Other | Attending: Emergency Medicine | Admitting: Emergency Medicine

## 2012-04-18 DIAGNOSIS — M25519 Pain in unspecified shoulder: Secondary | ICD-10-CM | POA: Insufficient documentation

## 2012-04-18 DIAGNOSIS — Z8709 Personal history of other diseases of the respiratory system: Secondary | ICD-10-CM | POA: Insufficient documentation

## 2012-04-18 DIAGNOSIS — Z8659 Personal history of other mental and behavioral disorders: Secondary | ICD-10-CM | POA: Insufficient documentation

## 2012-04-18 DIAGNOSIS — M898X1 Other specified disorders of bone, shoulder: Secondary | ICD-10-CM

## 2012-04-18 DIAGNOSIS — I10 Essential (primary) hypertension: Secondary | ICD-10-CM | POA: Insufficient documentation

## 2012-04-18 DIAGNOSIS — Z79899 Other long term (current) drug therapy: Secondary | ICD-10-CM | POA: Insufficient documentation

## 2012-04-18 DIAGNOSIS — F319 Bipolar disorder, unspecified: Secondary | ICD-10-CM | POA: Insufficient documentation

## 2012-04-18 DIAGNOSIS — F17200 Nicotine dependence, unspecified, uncomplicated: Secondary | ICD-10-CM | POA: Insufficient documentation

## 2012-04-18 DIAGNOSIS — K029 Dental caries, unspecified: Secondary | ICD-10-CM

## 2012-04-18 HISTORY — DX: Essential (primary) hypertension: I10

## 2012-04-18 MED ORDER — DIAZEPAM 5 MG PO TABS
10.0000 mg | ORAL_TABLET | Freq: Once | ORAL | Status: AC
  Administered 2012-04-18: 10 mg via ORAL
  Filled 2012-04-18 (×2): qty 1

## 2012-04-18 MED ORDER — IBUPROFEN 800 MG PO TABS
800.0000 mg | ORAL_TABLET | Freq: Once | ORAL | Status: AC
  Administered 2012-04-18: 800 mg via ORAL
  Filled 2012-04-18: qty 1

## 2012-04-18 MED ORDER — IBUPROFEN 600 MG PO TABS: 600.0000 mg | ORAL_TABLET | Freq: Four times a day (QID) | ORAL | Status: DC | PRN

## 2012-04-18 MED ORDER — DIAZEPAM 5 MG PO TABS: 5.0000 mg | ORAL_TABLET | Freq: Two times a day (BID) | ORAL | Status: DC

## 2012-04-18 NOTE — ED Notes (Signed)
Pt here for pain to back just under right shoulder blade. Started hurting two days ago and has gotten worse. Also c/o pain to molar on right upper gumline.
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
## 2012-04-19 NOTE — ED Provider Notes (Signed)
Medical screening examination/treatment/procedure(s) were performed by non-physician practitioner and as supervising physician I was immediately available for consultation/collaboration.   Maxwell Lemen M Vaishnav Demartin, DO 04/19/12 1233 

## 2012-06-26 ENCOUNTER — Emergency Department (HOSPITAL_COMMUNITY)
Admission: EM | Admit: 2012-06-26 | Discharge: 2012-06-27 | Disposition: A | Discharge status: Home / Self Care | Payer: Medicaid Other | Attending: Emergency Medicine | Admitting: Emergency Medicine

## 2012-06-26 ENCOUNTER — Encounter (HOSPITAL_COMMUNITY): Payer: Self-pay | Admitting: Emergency Medicine

## 2012-06-26 DIAGNOSIS — M545 Low back pain, unspecified: Secondary | ICD-10-CM | POA: Insufficient documentation

## 2012-06-26 DIAGNOSIS — F319 Bipolar disorder, unspecified: Secondary | ICD-10-CM | POA: Insufficient documentation

## 2012-06-26 DIAGNOSIS — G8929 Other chronic pain: Secondary | ICD-10-CM | POA: Insufficient documentation

## 2012-06-26 DIAGNOSIS — F17200 Nicotine dependence, unspecified, uncomplicated: Secondary | ICD-10-CM | POA: Insufficient documentation

## 2012-06-26 DIAGNOSIS — Z8709 Personal history of other diseases of the respiratory system: Secondary | ICD-10-CM | POA: Insufficient documentation

## 2012-06-26 DIAGNOSIS — Z8659 Personal history of other mental and behavioral disorders: Secondary | ICD-10-CM | POA: Insufficient documentation

## 2012-06-26 DIAGNOSIS — I10 Essential (primary) hypertension: Secondary | ICD-10-CM | POA: Insufficient documentation

## 2012-06-26 DIAGNOSIS — Z79899 Other long term (current) drug therapy: Secondary | ICD-10-CM | POA: Insufficient documentation

## 2012-06-26 MED ORDER — DIAZEPAM 5 MG PO TABS
5.0000 mg | ORAL_TABLET | Freq: Once | ORAL | Status: AC
  Administered 2012-06-26: 5 mg via ORAL
  Filled 2012-06-26: qty 1

## 2012-06-26 MED ORDER — OXYCODONE-ACETAMINOPHEN 5-325 MG PO TABS
2.0000 | ORAL_TABLET | Freq: Once | ORAL | Status: AC
  Administered 2012-06-26: 2 via ORAL
  Filled 2012-06-26: qty 2

## 2012-06-26 MED ORDER — IBUPROFEN 400 MG PO TABS
400.0000 mg | ORAL_TABLET | Freq: Once | ORAL | Status: AC
  Administered 2012-06-26: 400 mg via ORAL
  Filled 2012-06-26: qty 1
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
## 2012-06-26 NOTE — ED Notes (Signed)
PT. REPORTS CHRONIC LOW BACK PAIN WORSE PAST 2 DAYS , DENIES RECENT FALL OR INJURY, AMBULATORY , NO DYSURIA OR HEMATURIA.

## 2012-06-27 MED ORDER — ONDANSETRON 4 MG PO TBDP
4.0000 mg | ORAL_TABLET | Freq: Once | ORAL | Status: AC
  Administered 2012-06-27: 4 mg via ORAL
  Filled 2012-06-27: qty 1

## 2012-06-27 NOTE — ED Provider Notes (Signed)
Medical screening examination/treatment/procedure(s) were performed by non-physician practitioner and as supervising physician I was immediately available for consultation/collaboration.  Jasmine Awe, MD 06/27/12 [phone]

## 2012-11-10 ENCOUNTER — Encounter (HOSPITAL_COMMUNITY): Payer: Self-pay

## 2012-11-10 ENCOUNTER — Emergency Department (HOSPITAL_COMMUNITY)
Admission: EM | Admit: 2012-11-10 | Discharge: 2012-11-10 | Disposition: A | Discharge status: Home / Self Care | Payer: Medicaid Other | Attending: Emergency Medicine | Admitting: Emergency Medicine

## 2012-11-10 DIAGNOSIS — F17200 Nicotine dependence, unspecified, uncomplicated: Secondary | ICD-10-CM | POA: Insufficient documentation

## 2012-11-10 DIAGNOSIS — F319 Bipolar disorder, unspecified: Secondary | ICD-10-CM | POA: Insufficient documentation

## 2012-11-10 DIAGNOSIS — Z8659 Personal history of other mental and behavioral disorders: Secondary | ICD-10-CM | POA: Insufficient documentation

## 2012-11-10 DIAGNOSIS — M436 Torticollis: Secondary | ICD-10-CM

## 2012-11-10 DIAGNOSIS — K0889 Other specified disorders of teeth and supporting structures: Secondary | ICD-10-CM

## 2012-11-10 DIAGNOSIS — Z8709 Personal history of other diseases of the respiratory system: Secondary | ICD-10-CM | POA: Insufficient documentation

## 2012-11-10 DIAGNOSIS — M549 Dorsalgia, unspecified: Secondary | ICD-10-CM | POA: Insufficient documentation

## 2012-11-10 DIAGNOSIS — I10 Essential (primary) hypertension: Secondary | ICD-10-CM | POA: Insufficient documentation

## 2012-11-10 DIAGNOSIS — K089 Disorder of teeth and supporting structures, unspecified: Secondary | ICD-10-CM | POA: Insufficient documentation

## 2012-11-10 DIAGNOSIS — Z79899 Other long term (current) drug therapy: Secondary | ICD-10-CM | POA: Insufficient documentation

## 2012-11-10 MED ORDER — DIPHENHYDRAMINE HCL 12.5 MG/5ML PO ELIX
12.5000 mg | ORAL_SOLUTION | Freq: Once | ORAL | Status: AC
  Administered 2012-11-10: 12.5 mg via ORAL
  Filled 2012-11-10: qty 5

## 2012-11-10 MED ORDER — DIAZEPAM 5 MG PO TABS
10.0000 mg | ORAL_TABLET | Freq: Once | ORAL | Status: AC
Start: 2012-11-10 — End: 2012-11-10
  Administered 2012-11-10: 10 mg via ORAL
  Filled 2012-11-10: qty 2

## 2012-11-10 MED ORDER — DIAZEPAM 5 MG PO TABS: 5.0000 mg | ORAL_TABLET | Freq: Two times a day (BID) | ORAL | Status: DC

## 2012-11-10 MED ORDER — TRAMADOL HCL 50 MG PO TABS: 50.0000 mg | ORAL_TABLET | Freq: Four times a day (QID) | ORAL | Status: DC | PRN

## 2012-11-10 MED ORDER — IBUPROFEN 800 MG PO TABS
800.0000 mg | ORAL_TABLET | Freq: Once | ORAL | Status: AC
  Administered 2012-11-10: 800 mg via ORAL
  Filled 2012-11-10: qty 1

## 2012-11-10 NOTE — ED Notes (Signed)
Pt states she has a ride home. 

## 2012-11-10 NOTE — ED Notes (Signed)
Pt states that Motrin makes her nose runny. Pt with no acute distress. Breaths even/unlabored. Further med orders received.

## 2012-11-10 NOTE — ED Notes (Signed)
Patient c/o dental pain upper teeth and right neck pain that radiates to the mid back. MAE.

## 2012-11-10 NOTE — ED Provider Notes (Signed)
Medical screening examination/treatment/procedure(s) were performed by non-physician practitioner and as supervising physician I was immediately available for consultation/collaboration.  Ethelda Chick, MD 11/10/12 [phone]

## 2012-11-10 NOTE — ED Provider Notes (Signed)
History  This chart was scribed for [employer] [name] - PA by Manuela Schwartz, ED scribe. This patient was seen in room WTR7/WTR7 and the patient's care was started at 1926.  CSN: [id number] Arrival date & time 11/10/12  1756  First MD Initiated Contact with Patient 11/10/12 1926     Chief Complaint  Patient presents with  . Dental Pain  . Neck Pain   Patient is a 47 y.o. female presenting with neck injury. The history is provided by the patient. No language interpreter was used.  Neck Injury This is a new problem. The current episode started 3 to 5 hours ago. The problem occurs constantly. The problem has not changed since onset.Pertinent negatives include no chest pain, no abdominal pain, no headaches and no shortness of breath.   HPI Comments: Barbara Alvarez is a 47 y.o. female who presents to the Emergency Department complaining of sudden onset, moderate to severe neck pain which began while blow drying her hair this AM. She states pain is on the right side and feels like a muscle spasm. She states the pain is constant and moving her neck to either side makes her pain worse, nothing makes it better. She did not try taking any medicines for this problem PTA. She also c/o upper dental pain associated with rotting tooth upper jaw. She states the pain in her jaw began this AM also and radiates to her back. She denies fever/chills, cough, SOB.  She states allergic to haldol.   Past Medical History  Diagnosis Date  . Bipolar 1 disorder   . Claustrophobia   . Bronchitis   . Hypertension   . Back pain    Past Surgical History  Procedure Laterality Date  . Cholecystectomy    . Ligament repair      left arm   History reviewed. No pertinent family history. History  Substance Use Topics  . Smoking status: Current Every Day Smoker -- 1.00 packs/day    Types: Cigarettes  . Smokeless tobacco: Never Used  . Alcohol Use: Yes     Comment: seldom   OB History   Grav Para Term Preterm Abortions  TAB SAB Ect Mult Living                 Review of Systems  Constitutional: Negative for fever and chills.  HENT: Positive for neck pain (right sided), neck stiffness and dental problem (upper dental pain). Negative for congestion and rhinorrhea.   Respiratory: Negative for shortness of breath.   Cardiovascular: Negative for chest pain.  Gastrointestinal: Negative for nausea, vomiting and abdominal pain.  Musculoskeletal: Negative for back pain.  Skin: Negative for color change and pallor.  Neurological: Negative for weakness and headaches.  All other systems reviewed and are negative.   A complete 10 system review of systems was obtained and all systems are negative except as noted in the HPI and PMH.   Allergies  Haloperidol decanoate  Home Medications   Current Outpatient Rx  Name  Route  Sig  Dispense  Refill  . albuterol (PROVENTIL HFA;VENTOLIN HFA) 108 (90 BASE) MCG/ACT inhaler   Inhalation   Inhale 2 puffs into the lungs every 6 (six) hours as needed. For shortness of breath         . citalopram (CELEXA) 10 MG tablet   Oral   Take 10 mg by mouth at bedtime.         . diazepam (VALIUM) 5 MG tablet   Oral  Take 1 tablet (5 mg total) by mouth 2 (two) times daily.   10 tablet   0   . diphenhydrAMINE (BENADRYL) 25 mg capsule   Oral   Take 25 mg by mouth every 6 (six) hours as needed for allergies.         Marland Kitchen EPINEPHrine (EPIPEN) 0.3 mg/0.3 mL DEVI   Intramuscular   Inject 0.3 mg into the muscle as needed. For allergic reactions         . ibuprofen (ADVIL,MOTRIN) 200 MG tablet   Oral   Take 600 mg by mouth every 6 (six) hours as needed for pain.         Marland Kitchen lisinopril (PRINIVIL,ZESTRIL) 10 MG tablet   Oral   Take 10 mg by mouth daily.         . traMADol (ULTRAM) 50 MG tablet   Oral   Take 1 tablet (50 mg total) by mouth every 6 (six) hours as needed for pain.   15 tablet   0    Triage Vitals: BP 133/74  Pulse 74  Temp(Src) 98.5 F (36.9 C)  (Oral)  Resp 18  Ht 5\' 7"  (1.702 m)  Wt 135 lb (61.236 kg)  BMI 21.14 kg/m2  SpO2 99%  LMP 11/04/2012 Physical Exam  Nursing note and vitals reviewed. Constitutional: She is oriented to person, place, and time. She appears well-developed and well-nourished. No distress.  HENT:  Head: Normocephalic and atraumatic.  Mouth/Throat: Uvula is midline, oropharynx is clear and moist and mucous membranes are normal. Normal dentition. Dental caries (Pts tooth shows no obvious abscess but moderate to severe tenderness to palpation of marked tooth) present. No edematous.  Eyes: EOM are normal. Pupils are equal, round, and reactive to light.  Neck: Trachea normal, normal range of motion and full passive range of motion without pain. Neck supple. Muscular tenderness present. No spinous process tenderness present. No tracheal deviation present.    Cardiovascular: Normal rate, regular rhythm, normal heart sounds and normal pulses.   Pulmonary/Chest: Effort normal and breath sounds normal. No respiratory distress. Chest wall is not dull to percussion. She exhibits no tenderness, no crepitus, no edema, no deformity and no retraction.  Abdominal: Normal appearance.  Musculoskeletal: Normal range of motion.  Neurological: She is alert and oriented to person, place, and time. She has normal strength.  Skin: Skin is warm, dry and intact. She is not diaphoretic.  Psychiatric: She has a normal mood and affect. Her speech is normal and behavior is normal. Cognition and memory are normal.    ED Course  Procedures (including critical care time) DIAGNOSTIC STUDIES: Oxygen Saturation is 99% on room air, normal by my interpretation.    COORDINATION OF CARE: At 734 PM Discussed treatment plan with patient which includes valium, advil, benadryl. Patient agrees.   Labs Reviewed - No data to display No results found. 1. Toothache   2. Torticollis     MDM  *Patient has dental pain. No emergent s/sx's present.  Patent airway. No trismus.  Will be given pain medication and antibiotics. I discussed the need to call dentist within 24/48 hours for follow-up. Dental referral given. Return to ED precautions given.  Pt voiced understanding and has agreed to follow-up.   Medications in ED helped patients pain significantly. She asked for Percocet but I declined, unhappy about not getting Percocet. Given Rx for Ultram and Valium. Referred to Ortho.  46 y.o.Karra B Skufca's evaluation in the Emergency Department is complete. It has been  determined that no acute conditions requiring further emergency intervention are present at this time. The patient/guardian have been advised of the diagnosis and plan. We have discussed signs and symptoms that warrant return to the ED, such as changes or worsening in symptoms.  Vital signs are stable at discharge. Filed Vitals:   11/10/12 1846  BP: 133/74  Pulse: 74  Temp: 98.5 F (36.9 C)  Resp: 18    Patient/guardian has voiced understanding and agreed to follow-up with the PCP or specialist.  I personally performed the services described in this documentation, which was scribed in my presence. The recorded information has been reviewed and is accurate.     Dorthula Matas, PA-C 11/10/12 2056

## 2012-12-28 ENCOUNTER — Emergency Department (HOSPITAL_COMMUNITY): Payer: No Typology Code available for payment source

## 2012-12-28 ENCOUNTER — Emergency Department (HOSPITAL_COMMUNITY)
Admission: EM | Admit: 2012-12-28 | Discharge: 2012-12-28 | Disposition: A | Discharge status: Home / Self Care | Payer: No Typology Code available for payment source | Attending: Emergency Medicine | Admitting: Emergency Medicine

## 2012-12-28 ENCOUNTER — Encounter (HOSPITAL_COMMUNITY): Payer: Self-pay | Admitting: Emergency Medicine

## 2012-12-28 DIAGNOSIS — S59909A Unspecified injury of unspecified elbow, initial encounter: Secondary | ICD-10-CM | POA: Diagnosis not present

## 2012-12-28 DIAGNOSIS — F17200 Nicotine dependence, unspecified, uncomplicated: Secondary | ICD-10-CM | POA: Insufficient documentation

## 2012-12-28 DIAGNOSIS — R209 Unspecified disturbances of skin sensation: Secondary | ICD-10-CM | POA: Insufficient documentation

## 2012-12-28 DIAGNOSIS — I10 Essential (primary) hypertension: Secondary | ICD-10-CM | POA: Insufficient documentation

## 2012-12-28 DIAGNOSIS — R109 Unspecified abdominal pain: Secondary | ICD-10-CM | POA: Insufficient documentation

## 2012-12-28 DIAGNOSIS — S6990XA Unspecified injury of unspecified wrist, hand and finger(s), initial encounter: Secondary | ICD-10-CM | POA: Insufficient documentation

## 2012-12-28 DIAGNOSIS — R05 Cough: Secondary | ICD-10-CM | POA: Insufficient documentation

## 2012-12-28 DIAGNOSIS — R269 Unspecified abnormalities of gait and mobility: Secondary | ICD-10-CM | POA: Diagnosis not present

## 2012-12-28 DIAGNOSIS — S4980XA Other specified injuries of shoulder and upper arm, unspecified arm, initial encounter: Secondary | ICD-10-CM | POA: Diagnosis not present

## 2012-12-28 DIAGNOSIS — Z8659 Personal history of other mental and behavioral disorders: Secondary | ICD-10-CM | POA: Insufficient documentation

## 2012-12-28 DIAGNOSIS — F319 Bipolar disorder, unspecified: Secondary | ICD-10-CM | POA: Diagnosis not present

## 2012-12-28 DIAGNOSIS — R059 Cough, unspecified: Secondary | ICD-10-CM | POA: Insufficient documentation

## 2012-12-28 DIAGNOSIS — Y9389 Activity, other specified: Secondary | ICD-10-CM | POA: Insufficient documentation

## 2012-12-28 DIAGNOSIS — S3981XA Other specified injuries of abdomen, initial encounter: Secondary | ICD-10-CM | POA: Insufficient documentation

## 2012-12-28 DIAGNOSIS — Y9289 Other specified places as the place of occurrence of the external cause: Secondary | ICD-10-CM | POA: Diagnosis not present

## 2012-12-28 DIAGNOSIS — S0993XA Unspecified injury of face, initial encounter: Secondary | ICD-10-CM | POA: Diagnosis present

## 2012-12-28 DIAGNOSIS — R112 Nausea with vomiting, unspecified: Secondary | ICD-10-CM | POA: Insufficient documentation

## 2012-12-28 DIAGNOSIS — S298XXA Other specified injuries of thorax, initial encounter: Secondary | ICD-10-CM | POA: Insufficient documentation

## 2012-12-28 DIAGNOSIS — R0602 Shortness of breath: Secondary | ICD-10-CM | POA: Diagnosis not present

## 2012-12-28 DIAGNOSIS — IMO0002 Reserved for concepts with insufficient information to code with codable children: Secondary | ICD-10-CM | POA: Insufficient documentation

## 2012-12-28 DIAGNOSIS — S79919A Unspecified injury of unspecified hip, initial encounter: Secondary | ICD-10-CM | POA: Insufficient documentation

## 2012-12-28 DIAGNOSIS — Z8709 Personal history of other diseases of the respiratory system: Secondary | ICD-10-CM | POA: Insufficient documentation

## 2012-12-28 DIAGNOSIS — Z79899 Other long term (current) drug therapy: Secondary | ICD-10-CM | POA: Insufficient documentation

## 2012-12-28 DIAGNOSIS — S46909A Unspecified injury of unspecified muscle, fascia and tendon at shoulder and upper arm level, unspecified arm, initial encounter: Secondary | ICD-10-CM | POA: Insufficient documentation

## 2012-12-28 LAB — COMPREHENSIVE METABOLIC PANEL
Albumin: 3.9 g/dL (ref 3.5–5.2)
Alkaline Phosphatase: 43 U/L (ref 39–117)
BUN: 7 mg/dL (ref 6–23)
Creatinine, Ser: 0.7 mg/dL (ref 0.50–1.10)
GFR calc Af Amer: >90 mL/min (ref >90)
Glucose, Bld: 77 mg/dL (ref 70–99)
Total Bilirubin: 0.5 mg/dL (ref 0.3–1.2)
Total Protein: 7.1 g/dL (ref 6.0–8.3)

## 2012-12-28 LAB — CBC
HCT: 39 % (ref 36.0–46.0)
Hemoglobin: 13.3 g/dL (ref 12.0–15.0)
MCHC: 34.1 g/dL (ref 30.0–36.0)
MCV: 86.1 fL (ref 78.0–100.0)
RDW: 13.1 % (ref 11.5–15.5)

## 2012-12-28 MED ORDER — CYCLOBENZAPRINE HCL 10 MG PO TABS: 10.0000 mg | ORAL_TABLET | Freq: Two times a day (BID) | ORAL | Status: DC | PRN

## 2012-12-28 MED ORDER — MORPHINE SULFATE 4 MG/ML IJ SOLN
4.0000 mg | Freq: Once | INTRAMUSCULAR | Status: AC
  Administered 2012-12-28: 4 mg via INTRAVENOUS
  Filled 2012-12-28: qty 1

## 2012-12-28 MED ORDER — POTASSIUM CHLORIDE CRYS ER 20 MEQ PO TBCR
40.0000 meq | EXTENDED_RELEASE_TABLET | Freq: Once | ORAL | Status: AC
  Administered 2012-12-28: 40 meq via ORAL
  Filled 2012-12-28: qty 2

## 2012-12-28 MED ORDER — ONDANSETRON HCL 4 MG/2ML IJ SOLN
4.0000 mg | Freq: Once | INTRAMUSCULAR | Status: AC
  Administered 2012-12-28: 4 mg via INTRAVENOUS
  Filled 2012-12-28: qty 2

## 2012-12-28 MED ORDER — IOHEXOL 300 MG/ML  SOLN
100.0000 mL | Freq: Once | INTRAMUSCULAR | Status: AC | PRN
  Administered 2012-12-28: 100 mL via INTRAVENOUS
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
## 2012-12-28 NOTE — ED Provider Notes (Signed)
Medical screening examination/treatment/procedure(s) were performed by non-physician practitioner and as supervising physician I was immediately available for consultation/collaboration. Devoria Albe, MD, FACEP   Ward Givens, MD 12/28/12 [phone]
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
## 2013-01-30 ENCOUNTER — Ambulatory Visit: Payer: Self-pay | Admitting: Obstetrics

## 2013-01-30 ENCOUNTER — Emergency Department (HOSPITAL_COMMUNITY): Payer: Medicaid Other

## 2013-01-30 ENCOUNTER — Emergency Department (HOSPITAL_COMMUNITY)
Admission: EM | Admit: 2013-01-30 | Discharge: 2013-01-30 | Disposition: A | Discharge status: Home / Self Care | Payer: Medicaid Other | Attending: Emergency Medicine | Admitting: Emergency Medicine

## 2013-01-30 ENCOUNTER — Encounter (HOSPITAL_COMMUNITY): Payer: Self-pay | Admitting: Emergency Medicine

## 2013-01-30 DIAGNOSIS — Y939 Activity, unspecified: Secondary | ICD-10-CM | POA: Insufficient documentation

## 2013-01-30 DIAGNOSIS — R21 Rash and other nonspecific skin eruption: Secondary | ICD-10-CM | POA: Insufficient documentation

## 2013-01-30 DIAGNOSIS — S40269A Insect bite (nonvenomous) of unspecified shoulder, initial encounter: Secondary | ICD-10-CM | POA: Insufficient documentation

## 2013-01-30 DIAGNOSIS — H9209 Otalgia, unspecified ear: Secondary | ICD-10-CM | POA: Insufficient documentation

## 2013-01-30 DIAGNOSIS — F319 Bipolar disorder, unspecified: Secondary | ICD-10-CM | POA: Insufficient documentation

## 2013-01-30 DIAGNOSIS — Y92009 Unspecified place in unspecified non-institutional (private) residence as the place of occurrence of the external cause: Secondary | ICD-10-CM | POA: Insufficient documentation

## 2013-01-30 DIAGNOSIS — W57XXXA Bitten or stung by nonvenomous insect and other nonvenomous arthropods, initial encounter: Secondary | ICD-10-CM | POA: Insufficient documentation

## 2013-01-30 DIAGNOSIS — Z79899 Other long term (current) drug therapy: Secondary | ICD-10-CM | POA: Insufficient documentation

## 2013-01-30 DIAGNOSIS — S90569A Insect bite (nonvenomous), unspecified ankle, initial encounter: Secondary | ICD-10-CM | POA: Insufficient documentation

## 2013-01-30 DIAGNOSIS — J069 Acute upper respiratory infection, unspecified: Secondary | ICD-10-CM | POA: Insufficient documentation

## 2013-01-30 DIAGNOSIS — I10 Essential (primary) hypertension: Secondary | ICD-10-CM | POA: Insufficient documentation

## 2013-01-30 DIAGNOSIS — F17200 Nicotine dependence, unspecified, uncomplicated: Secondary | ICD-10-CM | POA: Insufficient documentation

## 2013-01-30 HISTORY — DX: Unspecified hearing loss, right ear: H91.91

## 2013-01-30 LAB — RAPID STREP SCREEN (MED CTR MEBANE ONLY): Streptococcus, Group A Screen (Direct): NEGATIVE

## 2013-01-30 MED ORDER — OXYCODONE-ACETAMINOPHEN 5-325 MG PO TABS
2.0000 | ORAL_TABLET | Freq: Once | ORAL | Status: AC
  Administered 2013-01-30: 2 via ORAL
  Filled 2013-01-30: qty 2

## 2013-01-30 MED ORDER — ALBUTEROL SULFATE HFA 108 (90 BASE) MCG/ACT IN AERS
1.0000 | INHALATION_SPRAY | RESPIRATORY_TRACT | Status: DC | PRN
  Administered 2013-01-30: 2 via RESPIRATORY_TRACT
  Filled 2013-01-30: qty 6.7

## 2013-01-30 MED ORDER — IPRATROPIUM BROMIDE 0.02 % IN SOLN
0.5000 mg | Freq: Once | RESPIRATORY_TRACT | Status: AC
  Administered 2013-01-30: 0.5 mg via RESPIRATORY_TRACT
  Filled 2013-01-30: qty 2.5

## 2013-01-30 MED ORDER — ALBUTEROL SULFATE (5 MG/ML) 0.5% IN NEBU
5.0000 mg | INHALATION_SOLUTION | Freq: Once | RESPIRATORY_TRACT | Status: AC
  Administered 2013-01-30: 5 mg via RESPIRATORY_TRACT
  Filled 2013-01-30: qty 1

## 2013-01-30 MED ORDER — PSEUDOEPHEDRINE HCL 30 MG/5ML PO SYRP: 30.0000 mg | ORAL_SOLUTION | Freq: Four times a day (QID) | ORAL | Status: DC | PRN

## 2013-01-30 MED ORDER — ONDANSETRON 8 MG PO TBDP
8.0000 mg | ORAL_TABLET | Freq: Once | ORAL | Status: AC
  Administered 2013-01-30: 8 mg via ORAL
  Filled 2013-01-30: qty 1

## 2013-01-30 MED ORDER — PERMETHRIN 5 % EX CREA: TOPICAL_CREAM | CUTANEOUS | Status: DC

## 2013-01-30 NOTE — ED Notes (Signed)
Patient states that she started a having a sore throat today. Pain with swallowing. No tonsilar exudate noted.

## 2013-01-30 NOTE — ED Notes (Signed)
Pt presents to ED via POV.  Pt c/o of eye and nose drainage from seasonal allergies and a severe sore throat that started this morning.  Pt reports difficulty swallowing and SOB.

## 2013-01-30 NOTE — ED Notes (Signed)
At bedside to assess patient's pain and administer pain medicine. Asked patient to rate her pain level and patient stated "why does everybody keep on asking me that." Informed patient that her pain level would assessed at different intervals while she was her in the ED and that the nurse would reassess after pain administration to ensure effective pain management.

## 2013-01-30 NOTE — ED Notes (Signed)
Pt states she has bed bugs at her home.  Pt c/o of itching on her ankles, legs and shoulders with visible insect bits.

## 2013-01-30 NOTE — ED Notes (Signed)
Patient returned from xray.

## 2013-01-30 NOTE — Discharge Instructions (Signed)
Bedbugs Bedbugs are tiny bugs that live in and around beds. They come out at night and bite people lying in bed. Bedbug bites rarely cause a medical problem. The bites do cause red, itchy bumps. HOME CARE  Only take medicine as told by your doctor.  Wear pajamas with long sleeves and pant legs.  Call a pest control expert. You may need to throw away your mattress. Ask the pest control expert what you can do to keep the bedbugs from coming back. You may need to:  Put a plastic cover over your mattress.  Wash your clothes and bedding in hot water. Dry them in a hot dryer. The temperature should be hotter than 120 F (48.9 C).  Vacuum all around your bed often.  Check all used furniture, bedding, or clothes for bedbugs before you bring them into your house.  Remove bird nests and bat perches around your home.  After you travel, check your clothes and luggage for bedbugs before you bring them into your house. If you find any bedbugs, throw those items away. GET HELP RIGHT AWAY IF:  You have a fever.  You have red bug bites that keep coming back.  You have red bug bites that itch badly.  You have bug bites that cause a skin rash.  You have scratch marks that are red and sore. MAKE SURE YOU:  Understand these instructions.  Will watch your condition.  Will get help right away if you are not doing well or get worse. Document Released: 08/09/2010 Document Revised: 07/17/2011 Document Reviewed: 08/09/2010 [facility] Patient Information 2014 [place], [place].  RESOURCE GUIDE  Chronic Pain Problems: Contact [facility]  [phone] Patients need to be referred by their primary care doctor.  Insufficient Money for Medicine: Contact United Way:  call [phone]  No Primary Care Doctor: - Call Health Connect  [phone] - can help you locate a primary care doctor that  accepts your insurance, provides certain services, etc. - Physician Referral Service-  [phone]  Agencies that provide inexpensive medical care: - [facility]  [phone] - [facility]  [phone] - [facility]  [phone] - Women's Clinic  [phone] - Planned Parenthood  [phone] [facility]  [phone]  Medicaid-accepting [facility] Providers: - [facility]- [address]  [phone], Mon-Fri 9am-7pm, Sat 9am-1pm - [facility]- [address] [place], [address] [place]  [phone] - [facility]- [address] [place]  [phone] [facility]- [address]  [phone] - Renaye Rakers- [address] [place], [address], [phone]  Only accepts [place] patients after they have their name  applied to their card  Self Pay (no insurance) in [place]: - Sickle Cell Patients - [facility] Internal Medicine  [address] [place], [phone] - [facility] Urgent Care- [address] [place]  [phone]       [facility]- [address]       -     [facility]- see information above (Speak to [employer] if you do not have insurance)       -  [facility]- [address],  [phone]       -  [facility]- [address], [phone]       -  Dr Julio Sicks-  [address], [place], [phone]       -  [facility] - [address] [phone]       -  [facility]- [address], [phone], also [address], [phone]       -     [facility]- [address] [place], [phone], 1st & 3rd Saturday         every month, 10am-1pm  -     [facility]   [address] [place], [place].   Phone:  [phone], Fax:  [phone]. Hours of Operation:  9 am - 6 pm, M-F.  -     [facility]   [address], [place]   Phone: [phone], Fax:  [phone]. Hours of Operation:  8:30 am - 5:30 pm, M-F.  [facility] [address] [place], [place] [address] [phone]  [facility] [address] [place], [place] [address] [phone]  1) Find a Doctor and Pay Out of Pocket Although you won't have to find out who is covered by your insurance plan, it is a good idea to ask around and get recommendations. You will then need to call the office and see if the doctor you have chosen will accept you as a new patient and what types of options they offer for patients who are self-pay. Some doctors offer discounts or will set up payment plans for their patients who do not have insurance, but you will need to ask so you aren't surprised when you get to your appointment.  2) Contact Your Local Health Department Not all health departments have doctors that can see patients for sick visits, but many do, so it is worth a call to see if yours does. If you don't know where your local health department is, you can check in your phone book. The CDC also has a tool to help you locate your state's health department, and many state websites also have listings of all of their local health departments.  3) Find a Walk-in Clinic If your illness is not likely to be very severe or complicated, you may want to try a walk in clinic. These are popping up all over the country in pharmacies, drugstores, and shopping centers. They're usually staffed by nurse practitioners or physician assistants that have been trained to treat common illnesses and complaints. They're usually fairly quick and inexpensive. However, if you have serious medical issues or chronic medical problems, these are probably not your best option  STD Testing - [facility] Department of [facility] [place], STD Clinic, [address], [place], phone [phone] or [phone].  Monday - Friday, call for an appointment. [facility] Department  of [employer], STD Clinic, [address], [place], phone [phone] or [phone].  Monday - Friday, call for an appointment.  Abuse/Neglect: [facility] Child Abuse Hotline 2531366560 [facility] Child Abuse Hotline 475-138-4866 (After Hours)  Emergency Shelter:  [facility] 626-264-6561  Maternity Homes: - Room at the [place] [facility] 501-246-2935 - [facility] 3083562214  MRSA Hotline #:   570-137-4822  Dental Assistance If unable to pay or uninsured, contact:  [facility]. to become qualified for the adult dental clinic.  Patients with Medicaid: [facility]  [address], [phone] [address] [phone]  If unable to pay, or uninsured, contact [facility] [phone] in [place], [phone] in [employer]) to become qualified for the adult dental clinic  [facility] [address] [place], [place] [address] [phone] [email or web address]  Other Proofreader Services: - [facility]- [address] [place], [place], [place], [address], [phone], Ext. [phone], 2nd and 4th Thursday of the month at 6:30am.  10 clients each day by appointment, can sometimes see walk-in patients if someone does not show for an appointment. [facility]- [address] [place], [place], [address], [phone] - [facility] [address] [place], [place], [address], [phone] - [facility]- [phone] [facility]- [phone] [facility] [phone]       Behavioral Health Resources in the [facility]  Intensive Outpatient Programs: [facility]      [address] [place], [place] [phone] Both a day and evening program       [facility] Outpatient     [address]        [place],  [place] [address] [phone]         ADS: [facility] [address] [place] [phone]  [facility] ACCESS LINE: (202)675-9796 or [phone] [address] [place], [place] [address] [email or web address]   Substance Abuse Resources: - Alcohol and Drug Services  ([phone] - [facility] [phone] - [facility] [phone] Floydene Flock [phone] - Residential & Outpatient Substance Abuse Program  [phone]  Psychological Services: [facility]  [phone] [facility]  [phone] - [facility], [phone] [place]. [address], [place], ACCESS LINE: 716-438-8816 or [phone], [email or web address]  Mobile Crisis Teams:                                        Therapeutic Alternatives         Mobile Crisis Care Unit [phone]             [facility] [address] Ginette Otto [phone]                                         Interventionist [address] [place] [phone]  Self-Help/Support Groups: Mental Health Assoc. of [employer] of support groups 409-042-1271 (call for more info)  Narcotics Anonymous (NA) Caring Services [address] [place] - 2 meetings at this location  Residential Treatment Programs:  [facility]      [address]        [place]       [phone]         [facility] [address] [place] [address] [place], [place]  [address] [phone]  [facility]  [address] [place], [place] [address] [phone] Admissions: 8am-3pm M-F  [facility]     [address]        [place], [place] [address]       [phone]         [facility] [address] [place],  [place] [phone]  [facility] [address] [place], [place] [phone]  [facility] - Intensive Outpatient      [address]     [place], [place]       [phone]         [facility] (Addiction Recovery Care [facility]     [address] [place], [place] [phone] or [phone]  Residential Treatment Services (RTS), Medicaid [address] [place], [place] [phone]  [facility]ellowship [address]                                               [address] [place] [phone]  [facility] Resources: [phone]               General Therapy                                                Angie Fava, PhD        [address] [place], [place] [address]         [phone]   Insurance  [facility] Behavioral   [address] [place], [place] [address] [phone]  [facility] [address] [place], [place] [address] [phone] Insurance/Medicaid/sponsorship through [facility]                                              [address]                                        [place], [place] [address]    Therapy/tele-psych/case         [phone]          [facility] [address], [place]  [address]  Adolescent/group home/case management [phone]                                           Creola Corn PhD       General therapy       Insurance   [phone]         Dr. Lolly Mustache, [place], M-F [phone]  [facility]  United Way [facility] Dept. [address]                 [address]         [address] [place] [address]  [address]  [place]ristobal Goldmann Phone:  [phone]                                  Phone:  [phone]                   Phone:  [phone]  [facility], [phone] - [facility] - [facility]  [facility] Services- [phone]       -     [facility] in [place], [address],             [phone], [facility] Child Abuse Hotline 915-456-8317 or [phone] (After Hours)

## 2013-01-30 NOTE — ED Provider Notes (Signed)
CSN: [id number]     Arrival date & time 01/30/13  1812 History   First MD Initiated Contact with Patient 01/30/13 2022     Chief Complaint  Patient presents with  . Sore Throat   (Consider location/radiation/quality/duration/timing/severity/associated sxs/prior Treatment) HPI Comments: Patient is a 47 year old female with history of bipolar 1 disorder, claustrophobia, bronchitis, hypertension, back pain, hearing loss and right ear presents today with sudden onset of sore throat, cough, congestion, rhinorrhea, ear pain since 3 AM this morning. She reports she has been up and uncomfortable at that time. The air makes her throat hurt worse. Nothing makes her symptoms better. Her cough is sometimes productive. She reports she brings up a foul tasting mucus. She also has dental pain. The dental pain is on her upper gums bilaterally. The pain radiates into her ear. The pain is sharp.  She reports that she has not had her albuterol inhaler in 6 months. She also takes allergy shots which she has not taken in 6 months. She reports that she left her PCP because "they didn't do no Pap smear" and she wants a doctor that does everything all at once. She also reports that she has had itchy bites on her legs are arms. She has visualized bed bugs. She has done nothing for her bites or attempted to get rid of the bed bugs in the past. This all began when she moved into her new apartment in March.   Patient is a 47 y.o. female presenting with pharyngitis. The history is provided by the patient. No language interpreter was used.  Sore Throat Associated symptoms include congestion, coughing, a rash and a sore throat. Pertinent negatives include no abdominal pain, chills, fever, nausea or vomiting.    Past Medical History  Diagnosis Date  . Bipolar 1 disorder   . Claustrophobia   . Bronchitis   . Hypertension   . Back pain   . Hearing loss in right ear    Past Surgical History  Procedure Laterality Date  .  Cholecystectomy    . Ligament repair      left arm   No family history on file. History  Substance Use Topics  . Smoking status: Current Every Day Smoker -- 1.00 packs/day    Types: Cigarettes  . Smokeless tobacco: Never Used  . Alcohol Use: Yes     Comment: seldom   OB History   Grav Para Term Preterm Abortions TAB SAB Ect Mult Living                 Review of Systems  Constitutional: Negative for fever and chills.  HENT: Positive for ear pain, congestion, sore throat and rhinorrhea. Negative for ear discharge.   Respiratory: Positive for cough. Negative for shortness of breath.   Gastrointestinal: Negative for nausea, vomiting and abdominal pain.  Skin: Positive for rash.  All other systems reviewed and are negative.    Allergies  Haloperidol decanoate; Ibuprofen; and Tramadol  Home Medications   Current Outpatient Rx  Name  Route  Sig  Dispense  Refill  . carbamazepine (EQUETRO) 200 MG CP12 12 hr capsule   Oral   Take 200 mg by mouth 2 (two) times daily.         . diphenhydrAMINE (BENADRYL) 25 mg capsule   Oral   Take 25 mg by mouth every 6 (six) hours as needed for allergies.         . hydrOXYzine (ATARAX/VISTARIL) 50 MG tablet   Oral  Take 50 mg by mouth 3 (three) times daily as needed for itching.         Marland Kitchen QUEtiapine Fumarate (SEROQUEL XR) 150 MG 24 hr tablet   Oral   Take 150 mg by mouth at bedtime.          BP 120/74  Pulse 84  Temp(Src) 99 F (37.2 C) (Oral)  Resp 20  SpO2 99%  LMP 12/31/2012 Physical Exam  Nursing note and vitals reviewed. Constitutional: She is oriented to person, place, and time. She appears well-developed and well-nourished. She appears distressed.  HENT:  Head: Normocephalic and atraumatic.  Right Ear: Tympanic membrane, external ear and ear canal normal. No drainage.  Left Ear: Tympanic membrane, external ear and ear canal normal. No drainage.  Nose: Nose normal.  Mouth/Throat: Uvula is midline and oropharynx  is clear and moist.  Eyes: Conjunctivae and EOM are normal. Pupils are equal, round, and reactive to light.  Neck: Normal range of motion.  Cardiovascular: Normal rate, regular rhythm and normal heart sounds.   Pulmonary/Chest: Effort normal. No stridor. No respiratory distress. She has wheezes. She has no rales.  Abdominal: Soft. She exhibits no distension.  Musculoskeletal: Normal range of motion.  Neurological: She is alert and oriented to person, place, and time. She has normal strength.  Skin: Skin is warm and dry. Rash noted. She is not diaphoretic.  1 cm area of raised erythema scattered on lower legs. Excoriations.   Psychiatric: She has a normal mood and affect. Her behavior is normal.    ED Course  Procedures (including critical care time) Labs Review Labs Reviewed  RAPID STREP SCREEN  CULTURE, GROUP A STREP   Imaging Review Dg Chest 2 View  01/30/2013   CLINICAL DATA:  Shortness of breath and throat pain.  EXAM: CHEST  2 VIEW  COMPARISON:  12/21/2011 and 12/28/2012.  FINDINGS: The heart size and mediastinal contours are normal. The lungs are clear. There is no pleural effusion or pneumothorax. No acute osseous findings are identified.  IMPRESSION: Stable examination. No active cardiopulmonary process.   Electronically Signed   By: Roxy Horseman   On: 01/30/2013 21:39    MDM   1. URI (upper respiratory infection)   2. Bedbug bite    Pt CXR negative for acute infiltrate. Patients symptoms are consistent with URI, likely viral etiology. Discussed that antibiotics are not indicated for viral infections. Pt will be discharged with symptomatic treatment.  Verbalizes understanding and is agreeable with plan. Pt is hemodynamically stable & in NAD prior to dc. Return instructions given. Vital signs stable for discharge. Discussed case with Dr. Silverio Lay who agrees with plan. Patient / Family / Caregiver informed of clinical course, understand medical decision-making process, and agree with  plan.  Patient given elimite cream for bed bug bites. I gave her instructions about cleaning sheets and couch.     Mora Bellman, PA-C 01/31/13 0121  Mora Bellman, PA-C 01/31/13 0122

## 2013-01-31 NOTE — ED Provider Notes (Signed)
Medical screening examination/treatment/procedure(s) were performed by non-physician practitioner and as supervising physician I was immediately available for consultation/collaboration.   Richardean Canal, MD 01/31/13 [phone]

## 2013-02-02 LAB — CULTURE, GROUP A STREP

## 2013-02-14 ENCOUNTER — Encounter (HOSPITAL_COMMUNITY): Payer: Self-pay | Admitting: Emergency Medicine

## 2013-02-14 ENCOUNTER — Emergency Department (HOSPITAL_COMMUNITY)
Admission: EM | Admit: 2013-02-14 | Discharge: 2013-02-14 | Disposition: A | Discharge status: Home / Self Care | Payer: Medicaid Other | Attending: Emergency Medicine | Admitting: Emergency Medicine

## 2013-02-14 ENCOUNTER — Emergency Department (HOSPITAL_COMMUNITY): Payer: Medicaid Other

## 2013-02-14 DIAGNOSIS — F319 Bipolar disorder, unspecified: Secondary | ICD-10-CM | POA: Insufficient documentation

## 2013-02-14 DIAGNOSIS — B888 Other specified infestations: Secondary | ICD-10-CM | POA: Insufficient documentation

## 2013-02-14 DIAGNOSIS — Z8669 Personal history of other diseases of the nervous system and sense organs: Secondary | ICD-10-CM | POA: Insufficient documentation

## 2013-02-14 DIAGNOSIS — R079 Chest pain, unspecified: Secondary | ICD-10-CM | POA: Insufficient documentation

## 2013-02-14 DIAGNOSIS — Z79899 Other long term (current) drug therapy: Secondary | ICD-10-CM | POA: Insufficient documentation

## 2013-02-14 DIAGNOSIS — J209 Acute bronchitis, unspecified: Secondary | ICD-10-CM | POA: Insufficient documentation

## 2013-02-14 DIAGNOSIS — J40 Bronchitis, not specified as acute or chronic: Secondary | ICD-10-CM

## 2013-02-14 DIAGNOSIS — I10 Essential (primary) hypertension: Secondary | ICD-10-CM | POA: Insufficient documentation

## 2013-02-14 DIAGNOSIS — F17200 Nicotine dependence, unspecified, uncomplicated: Secondary | ICD-10-CM | POA: Insufficient documentation

## 2013-02-14 MED ORDER — HYDROXYZINE HCL 50 MG PO TABS: 50.0000 mg | ORAL_TABLET | Freq: Two times a day (BID) | ORAL | Status: DC | PRN

## 2013-02-14 MED ORDER — DIPHENHYDRAMINE HCL 25 MG PO CAPS
25.0000 mg | ORAL_CAPSULE | Freq: Once | ORAL | Status: AC
  Administered 2013-02-14: 25 mg via ORAL
  Filled 2013-02-14: qty 1

## 2013-02-14 MED ORDER — FAMOTIDINE 20 MG PO TABS
20.0000 mg | ORAL_TABLET | Freq: Once | ORAL | Status: AC
  Administered 2013-02-14: 20 mg via ORAL
  Filled 2013-02-14: qty 1

## 2013-02-14 MED ORDER — AZITHROMYCIN 250 MG PO TABS: 250.0000 mg | ORAL_TABLET | Freq: Every day | ORAL | Status: DC

## 2013-02-14 NOTE — ED Notes (Signed)
Pt reports that she was recently treated for bed bugs at [facility] a couple of weeks ago. States this morning she woke up with bites to her arms and chest. States that since the itching and burning returned she started having chest pain. Denies any SOB or n/v or diaphoresis.

## 2013-02-14 NOTE — ED Notes (Signed)
Provider at the bedside.  

## 2013-02-14 NOTE — ED Provider Notes (Signed)
CSN: [id number]     Arrival date & time 02/14/13  0804 History   First MD Initiated Contact with Patient 02/14/13 0805     No chief complaint on file.  (Consider location/radiation/quality/duration/timing/severity/associated sxs/prior Treatment) HPI  47 year old female with history of bipolar, bronchitis, and hypertension presents complaining of chest pain and rash. Patient states she moved to her house sometime in March. States that the house is infested with roaches and many different bugs. She has had trouble with bugs manifestation, and her landlord has not done anything about it. She was seen in the ED 2 weeks ago for complaints of rash. It was thought to be related to bed bugs. She was prescribed Elimite cream which she has been using twice, once last week, and again last night because it has not helped. She has also been spraying insect spray throughout the house. She reported having increased itchiness and rash for the past 2 weeks but worsened last night. This morning the rash is causing her to have a lot of discomfort including having pleuritic chest pain and shortness of breath. Symptom seems to be worsening with the onset of itch. States the Elimite cream has not helped. States she has been clean the sheets and couch as recommended.  She also mentioned that her plumbing system at home has been clogged up and she has not had opportunity to shower for the past week. Otherwise patient denies any other environmental changes. No medication changes. Denies any fever, headache, throat swelling, troubles swallowing, nausea vomiting or diarrhea.  No other specific treatment tried.  Furthermore patient has no significant risk factors concerning for PE, including no history of cancer,   excision is home alone, recent surgery, prolonged bed rest, calf pain or leg swelling. No prior history of PE or DVT. She is however a smoker, and has history of hypertension.     Past Medical History  Diagnosis Date   . Bipolar 1 disorder   . Claustrophobia   . Bronchitis   . Hypertension   . Back pain   . Hearing loss in right ear    Past Surgical History  Procedure Laterality Date  . Cholecystectomy    . Ligament repair      left arm   No family history on file. History  Substance Use Topics  . Smoking status: Current Every Day Smoker -- 1.00 packs/day    Types: Cigarettes  . Smokeless tobacco: Never Used  . Alcohol Use: Yes     Comment: seldom   OB History   Grav Para Term Preterm Abortions TAB SAB Ect Mult Living                 Review of Systems  Constitutional: Negative for fever.  Respiratory: Positive for shortness of breath.   Cardiovascular: Positive for chest pain.  Gastrointestinal: Negative for nausea, vomiting and diarrhea.  Skin: Positive for rash.  Neurological: Negative for numbness and headaches.    Allergies  Haloperidol decanoate; Ibuprofen; and Tramadol  Home Medications   Current Outpatient Rx  Name  Route  Sig  Dispense  Refill  . carbamazepine (EQUETRO) 200 MG CP12 12 hr capsule   Oral   Take 200 mg by mouth 2 (two) times daily.         . diphenhydrAMINE (BENADRYL) 25 mg capsule   Oral   Take 25 mg by mouth every 6 (six) hours as needed for allergies.         . hydrOXYzine (ATARAX/VISTARIL) 50  MG tablet   Oral   Take 50 mg by mouth 3 (three) times daily as needed for itching.         . permethrin (ELIMITE) 5 % cream      Apply to entire body other than face - let sit for 14 hours then wash off, may repeat in 1 week if still having symptoms   60 g   1   . pseudoephedrine (SUDAFED) 30 MG/5ML syrup   Oral   Take 5 mLs (30 mg total) by mouth 4 (four) times daily as needed.   118 mL   0   . QUEtiapine Fumarate (SEROQUEL XR) 150 MG 24 hr tablet   Oral   Take 150 mg by mouth at bedtime.          LMP 12/31/2012 Physical Exam  Nursing note and vitals reviewed. Constitutional: She appears well-developed and well-nourished. No  distress.  HENT:  Head: Atraumatic.  Mouth/Throat: Oropharynx is clear and moist.  Eyes: Conjunctivae and EOM are normal. Pupils are equal, round, and reactive to light.  Neck: Neck supple.  Cardiovascular: Normal rate and regular rhythm.   Pulmonary/Chest: Effort normal and breath sounds normal.  Abdominal: Soft. There is no tenderness.  Musculoskeletal: She exhibits no edema.  Neurological: She is alert.  Skin: Skin is warm. Rash (Multiple areas of indurated and erythematous lesions noted to the back of neck, right upper anterior chest wall, bilateral forearm, lower legs,,  low back and abdomen with excoriation marks. No petechia, pustular or vesicular lesions) noted.  No rash in webspace of fingers.    Psychiatric: She has a normal mood and affect.    ED Course  Procedures (including critical care time)   Date: 02/14/2013  Rate: 84  Rhythm: normal sinus rhythm  QRS Axis: normal  Intervals: normal  ST/T Wave abnormalities: normal  Conduction Disutrbances: none  Narrative Interpretation:   Old EKG Reviewed: none available for comparison.       8:32 AM Patient here with multiple rash throughout body. This does not appears to be hives but likely appears to be insect bite. No obvious evidence of skin infection. No airway compromise. Will give Benadryl and Pepcid.  Patient also reports pleuritic chest pain and shortness of breath when the itching sensation worsening. She is PERC negative, low suspicion for PE.  Doubt cardiac etiology.  ECG shows no ischemic changes.  WIll obtain CXR.  Care discussed with attending.    9:56 AM CXR with bronchitic changes.  SInce pt endorse pleuritic cp and is a smoker, will prescribe zpak.  Pt does take seroquel, which can cause prolonge QT when taking with Zpak.  Pt made aware, recommend finishing Zpak before continue seroquel.  Pt can also f/u with PCP for further management.  Rash otherwise without systemic feature.  Recommend hydrocortisone 1%  cream OTC.  Pt agrees with plan.  Return precaution given.  Smoking cessation discussed.    Labs Review Labs Reviewed - No data to display Imaging Review No results found.  EKG Interpretation   None       MDM   1. Infestation by bed bug   2. Bronchitis    BP 121/79  Pulse 71  Temp(Src) 98 F (36.7 C) (Oral)  Resp 18  Ht 5\' 7"  (1.702 m)  Wt 132 lb (59.875 kg)  BMI 20.67 kg/m2  SpO2 100%  LMP 12/31/2012  I have reviewed nursing notes and vital signs. I personally reviewed the imaging tests through PACS  system  I reviewed available ER/hospitalization records thought the EMR    Fayrene Helper, [place] 02/14/13 1610

## 2013-02-14 NOTE — ED Provider Notes (Signed)
Medical screening examination/treatment/procedure(s) were performed by non-physician practitioner and as supervising physician I was immediately available for consultation/collaboration.   Darlys Gales, MD 02/14/13 2153

## 2013-04-28 ENCOUNTER — Ambulatory Visit: Payer: Medicaid Other | Admitting: Obstetrics

## 2013-05-22 ENCOUNTER — Emergency Department (HOSPITAL_COMMUNITY)
Admission: EM | Admit: 2013-05-22 | Discharge: 2013-05-22 | Disposition: A | Discharge status: Home / Self Care | Payer: Medicaid Other | Attending: Emergency Medicine | Admitting: Emergency Medicine

## 2013-05-22 ENCOUNTER — Encounter (HOSPITAL_COMMUNITY): Payer: Self-pay | Admitting: Emergency Medicine

## 2013-05-22 ENCOUNTER — Emergency Department (HOSPITAL_COMMUNITY): Payer: Medicaid Other

## 2013-05-22 DIAGNOSIS — Y939 Activity, unspecified: Secondary | ICD-10-CM | POA: Insufficient documentation

## 2013-05-22 DIAGNOSIS — S93402A Sprain of unspecified ligament of left ankle, initial encounter: Secondary | ICD-10-CM

## 2013-05-22 DIAGNOSIS — M25519 Pain in unspecified shoulder: Secondary | ICD-10-CM | POA: Insufficient documentation

## 2013-05-22 DIAGNOSIS — M25511 Pain in right shoulder: Secondary | ICD-10-CM

## 2013-05-22 DIAGNOSIS — W010XXA Fall on same level from slipping, tripping and stumbling without subsequent striking against object, initial encounter: Secondary | ICD-10-CM | POA: Insufficient documentation

## 2013-05-22 DIAGNOSIS — Y929 Unspecified place or not applicable: Secondary | ICD-10-CM | POA: Insufficient documentation

## 2013-05-22 DIAGNOSIS — W009XXA Unspecified fall due to ice and snow, initial encounter: Secondary | ICD-10-CM

## 2013-05-22 DIAGNOSIS — F17200 Nicotine dependence, unspecified, uncomplicated: Secondary | ICD-10-CM | POA: Insufficient documentation

## 2013-05-22 DIAGNOSIS — R21 Rash and other nonspecific skin eruption: Secondary | ICD-10-CM | POA: Insufficient documentation

## 2013-05-22 DIAGNOSIS — S93409A Sprain of unspecified ligament of unspecified ankle, initial encounter: Secondary | ICD-10-CM | POA: Insufficient documentation

## 2013-05-22 MED ORDER — OXYCODONE-ACETAMINOPHEN 5-325 MG PO TABS: ORAL_TABLET | ORAL | Status: DC

## 2013-05-22 MED ORDER — OXYCODONE-ACETAMINOPHEN 5-325 MG PO TABS
2.0000 | ORAL_TABLET | Freq: Once | ORAL | Status: AC
  Administered 2013-05-22: 2 via ORAL
  Filled 2013-05-22: qty 2

## 2013-05-22 MED ORDER — HYDROCORTISONE 1 % EX CREA: 1.0000 "application " | TOPICAL_CREAM | Freq: Two times a day (BID) | CUTANEOUS | Status: DC

## 2013-05-22 NOTE — ED Provider Notes (Signed)
CSN: [id number]     Arrival date & time 05/22/13  1016 History   First MD Initiated Contact with Patient 05/22/13 1034    This chart was scribed for Junius Finner PA-C, a non-physician practitioner working with No att. providers found by Lewanda Rife, ED Scribe. This patient was seen in room TR09C/TR09C and the patient's care was started at 3:31 PM     No chief complaint on file.  (Consider location/radiation/quality/duration/timing/severity/associated sxs/prior Treatment) The history is provided by the patient. No language interpreter was used.   HPI Comments: Barbara Alvarez is a 48 y.o. female who presents to the Emergency Department complaining of constant severe left foot pain onset this morning after slipping on ice and inverting left ankle. Describes pain as aching and stabbing, constant 10/10. Reports associated moderate right shoulder pain secondary to catching her fall on a brick wall. Reports pain is exacerbated by weight bearing and by touch. Denies any alleviating factors. Denies associated other injuries, fall, and numbness. Denies hx of previous left ankle injury.  Additionally, reports bed bug exposure. States she stays at hotel since December 2014. Reports associated constant worsening pruritic rash on face. Reports child has been dx with beg bugs.   Past Medical History  Diagnosis Date  . Bipolar 1 disorder   . Claustrophobia   . Bronchitis   . Hypertension   . Back pain   . Hearing loss in right ear    Past Surgical History  Procedure Laterality Date  . Cholecystectomy    . Ligament repair      left arm   No family history on file. History  Substance Use Topics  . Smoking status: Current Every Day Smoker -- 1.00 packs/day    Types: Cigarettes  . Smokeless tobacco: Never Used  . Alcohol Use: No     Comment: states stopped drinking as New Years Resolution   OB History   Grav Para Term Preterm Abortions TAB SAB Ect Mult Living                 Review  of Systems  Constitutional: Negative for fever.  Musculoskeletal: Positive for myalgias.  Psychiatric/Behavioral: Negative for confusion.    Allergies  Haloperidol decanoate; Ibuprofen; and Tramadol  Home Medications   Current Outpatient Rx  Name  Route  Sig  Dispense  Refill  . carbamazepine (EQUETRO) 200 MG CP12 12 hr capsule   Oral   Take 200 mg by mouth at bedtime.          Marland Kitchen QUEtiapine (SEROQUEL XR) 300 MG 24 hr tablet   Oral   Take 300 mg by mouth at bedtime.         Marland Kitchen QUEtiapine (SEROQUEL) 50 MG tablet   Oral   Take 50 mg by mouth 2 (two) times daily.         . hydrocortisone cream 1 %   Topical   Apply 1 application topically 2 (two) times daily.   30 g   2   . oxyCODONE-acetaminophen (PERCOCET/ROXICET) 5-325 MG per tablet      Take 1-2 pills every 4-6 hours as needed for pain.   10 tablet   0    BP 120/61  Pulse 86  Temp(Src) 97.2 F (36.2 C) (Oral)  Resp 20  Ht 5\' 7"  (1.702 m)  Wt 132 lb (59.875 kg)  BMI 20.67 kg/m2  SpO2 98%  LMP 05/17/2013 Physical Exam  Nursing note and vitals reviewed. Constitutional: She is oriented to person,  place, and time. She appears well-developed and well-nourished.  HENT:  Head: Normocephalic and atraumatic.  Airway is patent  No angioedema   Eyes: Conjunctivae and EOM are normal.  Neck: Normal range of motion.  Cardiovascular: Normal rate and regular rhythm.   Pulses:      Radial pulses are 2+ on the right side.       Dorsalis pedis pulses are 2+ on the left side.       Posterior tibial pulses are 2+ on the left side.  Pulmonary/Chest: Effort normal and breath sounds normal.  Abdominal: Soft.  Musculoskeletal: Normal range of motion.       Right shoulder: She exhibits tenderness (diffuse ) and deformity. She exhibits normal range of motion, no bony tenderness, no swelling and no crepitus.       Left ankle: She exhibits normal range of motion, no swelling, no ecchymosis and no deformity. Tenderness. Medial  malleolus tenderness found. No lateral malleolus, no head of 5th metatarsal and no proximal fibula tenderness found. Achilles tendon normal.  Neurological: She is alert and oriented to person, place, and time.  Skin: Skin is warm and dry. There is erythema.  Mild erythematous papules facial nasolabial folds L > R side  No evidence of cellulitis, drainage, and induration.   Psychiatric: She has a normal mood and affect. Her behavior is normal.    ED Course  Procedures COORDINATION OF CARE:  Nursing notes reviewed. Vital signs reviewed. Initial pt interview and examination performed.   Discussed work up plan with pt at bedside, which includes x-ray of left ankle. Pt agrees with plan.  Nursing Notes Reviewed/ Care Coordinated Applicable Imaging Reviewed and incorporated into ED treatment Discussed results and treatment plan with pt. Pt demonstrates understanding and agrees with plan.   Treatment plan initiated: Medications  oxyCODONE-acetaminophen (PERCOCET/ROXICET) 5-325 MG per tablet 2 tablet (2 tablets Oral Given 05/22/13 1235)     Initial diagnostic testing ordered.    Labs Review Labs Reviewed - No data to display Imaging Review Dg Foot Complete Left  05/22/2013   CLINICAL DATA:  Pain post trauma  EXAM: LEFT FOOT - COMPLETE 3+ VIEW  COMPARISON:  November 04, 2009  FINDINGS: Oral, oblique, and lateral views were obtained. There is no fracture or dislocation. Joint spaces appear intact. No erosive change.  IMPRESSION: No abnormality noted.   Electronically Signed   By: Bretta BangWilliam  Woodruff M.D.   On: 05/22/2013 11:49    EKG Interpretation   None       MDM   1. Left ankle sprain   2. Fall from slipping on ice   3. Right shoulder pain   4. Rash    Pt presenting with left ankle sprain after slipping down steps on ice earlier this morning. Denies hitting head or LOC. Pt also c/o pruritic rash on face and left shoulder pain. Do not believe imaging of shoulder needed at this time.  Rash does not appear cellulitic in nature. Will tx rash with hydrocortisone cream.  Ankle, Rx: ASO splint, crutches, percocet. [facility] if pain not improving. F/u with PCP, [facility] as needed for rash and ongoing healthcare needs. Return precautions provided. Pt verbalized understanding and agreement with tx plan.  I personally performed the services described in this documentation, which was scribed in my presence. The recorded information has been reviewed and is accurate.    Junius Finnerrin O'Malley, PA-C 05/22/13 1531

## 2013-05-22 NOTE — ED Provider Notes (Signed)
Medical screening examination/treatment/procedure(s) were performed by non-physician practitioner and as supervising physician I was immediately available for consultation/collaboration.  EKG Interpretation   None         Darlys Galesavid Dalton Molesworth, MD 05/22/13 1750

## 2013-05-22 NOTE — ED Notes (Signed)
Stepped on patch of ice this am, twisted left ankle. No deformity noted.

## 2013-05-22 NOTE — Progress Notes (Signed)
Orthopedic Tech Progress Note Patient Details:  Marijean Bravoloise B Bucio 1965/12/09 [id number]  Ortho Devices Type of Ortho Device: ASO;Crutches Ortho Device/Splint Location: lle Ortho Device/Splint Interventions: Application   Nikki Domrawford, Arie Powell 05/22/2013, 12:38 PM

## 2013-06-10 ENCOUNTER — Emergency Department (HOSPITAL_COMMUNITY)
Admission: EM | Admit: 2013-06-10 | Discharge: 2013-06-10 | Disposition: A | Discharge status: Home / Self Care | Payer: Medicaid Other | Attending: Emergency Medicine | Admitting: Emergency Medicine

## 2013-06-10 ENCOUNTER — Encounter (HOSPITAL_COMMUNITY): Payer: Self-pay | Admitting: Emergency Medicine

## 2013-06-10 ENCOUNTER — Emergency Department (HOSPITAL_COMMUNITY): Payer: Medicaid Other

## 2013-06-10 DIAGNOSIS — W010XXA Fall on same level from slipping, tripping and stumbling without subsequent striking against object, initial encounter: Secondary | ICD-10-CM | POA: Insufficient documentation

## 2013-06-10 DIAGNOSIS — H9190 Unspecified hearing loss, unspecified ear: Secondary | ICD-10-CM | POA: Insufficient documentation

## 2013-06-10 DIAGNOSIS — F319 Bipolar disorder, unspecified: Secondary | ICD-10-CM | POA: Insufficient documentation

## 2013-06-10 DIAGNOSIS — S92919A Unspecified fracture of unspecified toe(s), initial encounter for closed fracture: Secondary | ICD-10-CM | POA: Insufficient documentation

## 2013-06-10 DIAGNOSIS — Y929 Unspecified place or not applicable: Secondary | ICD-10-CM | POA: Insufficient documentation

## 2013-06-10 DIAGNOSIS — F17200 Nicotine dependence, unspecified, uncomplicated: Secondary | ICD-10-CM | POA: Insufficient documentation

## 2013-06-10 DIAGNOSIS — Z79899 Other long term (current) drug therapy: Secondary | ICD-10-CM | POA: Insufficient documentation

## 2013-06-10 DIAGNOSIS — Y939 Activity, unspecified: Secondary | ICD-10-CM | POA: Insufficient documentation

## 2013-06-10 DIAGNOSIS — IMO0002 Reserved for concepts with insufficient information to code with codable children: Secondary | ICD-10-CM | POA: Insufficient documentation

## 2013-06-10 DIAGNOSIS — I10 Essential (primary) hypertension: Secondary | ICD-10-CM | POA: Insufficient documentation

## 2013-06-10 DIAGNOSIS — Z8739 Personal history of other diseases of the musculoskeletal system and connective tissue: Secondary | ICD-10-CM | POA: Insufficient documentation

## 2013-06-10 DIAGNOSIS — R269 Unspecified abnormalities of gait and mobility: Secondary | ICD-10-CM | POA: Insufficient documentation

## 2013-06-10 DIAGNOSIS — Z8709 Personal history of other diseases of the respiratory system: Secondary | ICD-10-CM | POA: Insufficient documentation

## 2013-06-10 MED ORDER — OXYCODONE-ACETAMINOPHEN 5-325 MG PO TABS
2.0000 | ORAL_TABLET | Freq: Once | ORAL | Status: AC
  Administered 2013-06-10: 2 via ORAL
  Filled 2013-06-10: qty 2

## 2013-06-10 MED ORDER — OXYCODONE-ACETAMINOPHEN 5-325 MG PO TABS: 1.0000 | ORAL_TABLET | Freq: Four times a day (QID) | ORAL | Status: DC | PRN

## 2013-06-10 MED ORDER — ONDANSETRON 8 MG PO TBDP
8.0000 mg | ORAL_TABLET | Freq: Once | ORAL | Status: AC
  Administered 2013-06-10: 8 mg via ORAL
  Filled 2013-06-10: qty 1

## 2013-06-10 NOTE — ED Notes (Signed)
Pt requesting re-evaluation for persistent l/ankle pain. Pt using crutches since injury. Pain increased

## 2013-06-10 NOTE — Discharge Instructions (Signed)
Buddy Taping of Toes We have taped your toes together to keep them from moving. This is called "buddy taping" since we used a part of your own body to keep the injured part still. We placed soft padding between your toes to keep them from rubbing against each other. Buddy taping will help with healing and to reduce pain. Keep your toes buddy taped together for as long as directed by your caregiver. HOME CARE INSTRUCTIONS   Raise your injured area above the level of your heart while sitting or lying down. Prop it up with pillows.  An ice pack used every twenty minutes, while awake, for the first one to two days may be helpful. Put ice in a plastic bag and put a towel between the bag and your skin.  Watch for signs that the taping is too tight. These signs may be:  Numbness of your taped toes.  Coolness of your taped toes.  Color change in the area beyond the tape.  Increased pain.  If you have any of these signs, loosen or rewrap the tape. If you need to loosen or rewrap the buddy tape, make sure you use the padding again. SEEK IMMEDIATE MEDICAL CARE IF:   You have worse pain, swelling, inflammation (soreness), drainage or bleeding after you rewrap the tape.  Any new problems occur. MAKE SURE YOU:   Understand these instructions.  Will watch your condition.  Will get help right away if you are not doing well or get worse. Document Released: 01/27/2004 Document Revised: 07/17/2011 Document Reviewed: 04/21/2008 [facility] Patient Information 2014 [place], [place].  Fracture A fracture is a break in a bone, due to a force on the bone that is greater than the bone's strength can handle. There are many types of fractures, including:  Complete fracture: The break passes completely through the bone.  Displaced: The ends of the bone fragments are not properly aligned.  Non-displaced: The ends of the bone fragments are in proper alignment.  Incomplete fracture (greenstick): The break  does not pass completely through the bone. Incomplete fractures may or may not be angular (angulated).  Open fracture (compound): Part of the broken bone pokes through the skin. Open fractures have a high risk for infection.  Closed fracture: The fracture has not broken through the skin.  Comminuted fracture: The bone is broken into more than two pieces.  Compression fracture: The break occurs from extreme pressure on the bone (includes crushing injury).  Impacted fracture: The broken bone ends have been driven into each other.  Avulsion fracture: A ligament or tendon pulls a small piece of bone off from the main bony segment.  Pathologic fracture: A fracture due to the bone being made weak by a disease (osteoporosis or tumors).  Stress fracture: A fracture caused by intense exercise or repetitive and prolonged pressure that makes the bone weak. SYMPTOMS   Pain, tenderness, bleeding, bruising, and swelling at the fracture site.  Weakness and inability to bear weight on the injured extremity.  Paleness and deformity (sometimes).  Loss of pulse, numbness, tingling, or paralysis below the fracture site (usually a limb); these are emergencies. CAUSES  Bone being subjected to a force greater than its strength. RISK INCREASES WITH:  Contact sports and falls from heights.  Previous or current bone problems (osteoporosis or tumors).  Poor balance.  Poor strength and flexibility. PREVENTION   Warm up and stretch properly before activity.  Maintain physical fitness:  Cardiovascular fitness.  Muscle strength.  Flexibility and  endurance.  Wear proper protective equipment.  Use proper exercise technique. RELATED COMPLICATIONS   Bone fails to heal (nonunion).  Bone heals in a poor position (malunion).  Low blood volume (hypovolemic), shock due to blood loss.  Clump of fat cells travels through the blood (fat embolus) from the injury site to the lungs or brain (more  common with thigh fractures).  Obstruction of nearby arteries. TREATMENT  Treatment first requires realigning of the bones (reduction) by a medically trained person, if the fracture is displaced. After realignment if the fracture is completed, or for non-displaced fractures, ice and medicine are used to reduce pain and inflammation. The bone and adjacent joints are then restrained with a splint, cast, or brace to allow the bones to heal without moving. Surgery is sometimes needed, to reposition the bones and hold the position with rods, pins, plates, or screws. Restraint for long periods of time may result in muscle and joint weakness or build up of fluid in tissues (edema). For this reason, physical therapy is often needed to regain strength and full range of motion. Recovery is complete when there is no bone motion at the fracture site and x-rays (radiographs) show complete healing.  MEDICATION   General anesthesia, sedation, or muscle relaxants may be needed to allow for realignment of the fracture. If pain medicine is needed, nonsteroidal anti-inflammatory medicines (aspirin and ibuprofen), or other minor pain relievers (acetaminophen), are often advised.  Do not take pain medicine for 7 days before surgery.  Stronger pain relievers may be prescribed by your caregiver. Use only as directed and only as much as you need. SEEK MEDICAL CARE IF:   The following occur after restraint or surgery. (Report any of these signs immediately):  Swelling above or below the fracture site.  Severe, persistent pain.  Blue or gray skin below the fracture site, especially under the nails. Numbness or loss of feeling below the fracture site. Document Released: 04/24/2005 Document Revised: 04/10/2012 Document Reviewed: 08/06/2008 [facility] Patient Information 2014 [place], [place].  Toe Fracture  with Rehab A fracture is a break in the bone that can be either partial or complete. Fractures of the toe bones  may or may not include the joints that separate the bones. SYMPTOMS   Severe pain over the fracture site at the time of injury that may persist for an extend period of time.  Pain, tenderness, inflammation, and/or bruising (contusion) over the fracture site.  Visible deformity, if the bone fragments are not properly aligned (displaced fracture).  Signs of vascular damage: numbness or coldness (uncommon). CAUSES  Toe fractures occur when a force is placed on the bone that is greater than it can withstand.  Direct hit (trauma) to the toe.  Indirect trauma to the toe, such as forcefully pivoting on a planted foot. RISK INCREASES WITH:  Performing activities barefoot (i.e. ballet, gymnastics).  Wearing shoes with little support or protection.  Sports with cleats (i.e. football, rugby, lacrosse, soccer).  Bone disease (i.e. osteoporosis, bone tumors). PREVENTION   Wear properly fitted and protective shoes.  Protect previously injured toes with tape or padding. PROGNOSIS  If treated properly, toe fractures usually heal within 4 to 6 weeks. RELATED COMPLICATIONS   Failure of the fracture to heal (nonunion).  Healing of the fracture in a poor position (malunion).  Recurring symptoms.  Recurring symptoms that result in a chronic problem.  Excessive bleeding, causing pressure on nerves and blood vessels (rare).  Arthritis of the affected joints.  Stopping of  bone growth in children.  Infection in fractures where the skin is broken over the fracture (open fracture).  Shortening of injured bones. TREATMENT  Treatment first involves the use of ice and medicine to reduce pain and inflammation. The toe should be restrained for a period of time to allow for healing, usually about 4 weeks. Your caregiver may advise wearing a hard-soled shoe to minimize stress on the healing bone. Surgery is uncommon for this injury, but may be necessary if the fracture is severely displaced or if  the bone pushes through the skin. Surgery typically involves the use of screws, pins, and/or plates to hold the fracture in place. After surgery, restraint of the foot is necessary. MEDICATION   If pain medicine is necessary, nonsteroidal anti-inflammatory medications (aspirin and ibuprofen), or other minor pain relievers (acetaminophen), are often recommended.  Do not take pain medicine for 7 days before surgery.  Prescription pain relievers may be given if your caregiver thinks they are needed. Use only as directed and only as much as you need. COLD THERAPY  Cold treatment (icing) relieves pain and reduces inflammation. Cold treatment should be applied for 10 to 15 minutes every 2 to 3 hours, and immediately after activity that aggravates your symptoms. Use ice packs or an ice massage. SEEK MEDICAL CARE IF:   Treatment does not seem to help, or the condition gets worse.  Any medicines produce negative side effects.  Any complications from surgery occur:  Pain, numbness, or coldness in the affected foot.  Discoloration beneath the toenails (blue or gray) of the affected foot.  Signs of infection (fever, pain, inflammation, redness, or persistent bleeding). EXERCISES RANGE OF MOTION (ROM) AND STRETCHING EXERCISES - Toe Fracture (Phalangeal) These exercises may help you when beginning to rehabilitate your injury. Your symptoms may resolve with or without further involvement from your physician, physical therapist or athletic trainer. While completing these exercises, remember:   Restoring tissue flexibility helps normal motion to return to the joints. This allows healthier, less painful movement and activity.  An effective stretch should be held for at least 30 seconds.  A stretch should never be painful. You should only feel a gentle lengthening or release in the stretched tissue. RANGE OF MOTION - Dorsi/Plantar Flexion  While sitting with your right / left knee straight, draw the  top of your foot upwards by flexing your ankle. Then reverse the motion, pointing your toes downward.  Hold each position for __________ seconds.  After completing your first set of exercises, repeat this exercise with your knee bent. Repeat __________ times. Complete this exercise __________ times per day.  RANGE OF MOTION - Ankle Alphabet Imagine your right / left big toe is a pen. Keeping your hip and knee still, write out the entire alphabet with your "pen." Make the letters as large as you can without increasing any discomfort. Repeat __________ times. Complete this exercise __________ times per day.  RANGE OF MOTION - Toe Extension, Flexion  Sit with your right / left leg crossed over your opposite knee.  Grasp your toes and gently pull them back toward the top of your foot. You should feel a stretch on the bottom of your toes and foot.  Hold this stretch for __________ seconds.  Now, gently pull your toes toward the bottom of your foot. You should feel a stretch on the top of your toes and foot.  Hold this stretch for __________ seconds. Repeat __________ times. Complete this stretch__________ times per day.  STRENGTHENING EXERCISES - Toe Fracture (Phalangeal) These exercises may help you when beginning to rehabilitate your injury. They may resolve your symptoms with or without further involvement from your physician, physical therapist or athletic trainer. While completing these exercises, remember:   Muscles can gain both the endurance and the strength needed for everyday activities through controlled exercises.  Complete these exercises as instructed by your physician, physical therapist or athletic trainer. Increase the resistance and repetitions only as guided.  You may experience muscle soreness or fatigue, but the pain or discomfort you are trying to eliminate should never worsen during these exercises. If this pain does get worse, stop and make sure you are following the  directions exactly. If the pain is still present after adjustments, discontinue the exercise until you can discuss the trouble with your clinician. STRENGTH - Towel Curls  Sit in a chair, on a non-carpeted surface.  Place your foot on a towel, keeping your heel on the floor.  Pull the towel toward your heel only by curling your toes. Keep your heel on the floor.  If instructed by your physician, physical therapist or athletic trainer, add ____________________ at the end of the towel. Repeat __________ times. Complete this exercise __________ times per day. Document Released: 04/24/2005 Document Revised: 07/17/2011 Document Reviewed: 08/06/2008 [facility] Patient Information 2014 [place], [place].

## 2013-06-10 NOTE — ED Provider Notes (Signed)
CSN: [id number]     Arrival date & time 06/10/13  1145 History  This chart was scribed for non-physician practitioner, Junious Silk, PA-C working with Hilario Quarry, MD by Greggory Stallion, ED scribe. This patient was seen in room WTR5/WTR5 and the patient's care was started at 12:41 PM.   Chief Complaint  Patient presents with  . Ankle Pain    l/ankle pain x 2 weeks   The history is provided by the patient. No language interpreter was used.   HPI Comments: Barbara Alvarez is a 48 y.o. female who presents to the Emergency Department complaining of continuing, burning left ankle and foot pain that started 2 weeks ago. Pt originally injured her ankle after slipping on some ice. She was evaluated here after the incident and was told to follow up with orthopedics. Pt states ortho wouldn't see her because nothing was broken. She has iced, elevated, worn an ankle brace and used pain medication with some relief. Bearing weight and ambulation worsen the pain and cause it to be sharp.   Past Medical History  Diagnosis Date  . Bipolar 1 disorder   . Claustrophobia   . Bronchitis   . Hypertension   . Back pain   . Hearing loss in right ear    Past Surgical History  Procedure Laterality Date  . Cholecystectomy    . Ligament repair      left arm   No family history on file. History  Substance Use Topics  . Smoking status: Current Every Day Smoker -- 1.00 packs/day    Types: Cigarettes  . Smokeless tobacco: Never Used  . Alcohol Use: No     Comment: states stopped drinking as New Years Resolution   OB History   Grav Para Term Preterm Abortions TAB SAB Ect Mult Living                 Review of Systems  Constitutional: Negative for fever and chills.  Respiratory: Negative for shortness of breath.   Cardiovascular: Negative for chest pain.  Musculoskeletal: Positive for arthralgias, gait problem and myalgias.  All other systems reviewed and are negative.   Allergies  Haloperidol  decanoate; Ibuprofen; and Tramadol  Home Medications   Current Outpatient Rx  Name  Route  Sig  Dispense  Refill  . carbamazepine (EQUETRO) 200 MG CP12 12 hr capsule   Oral   Take 200 mg by mouth at bedtime.          . hydrocortisone cream 1 %   Topical   Apply 1 application topically 2 (two) times daily.   30 g   2   . oxyCODONE-acetaminophen (PERCOCET/ROXICET) 5-325 MG per tablet      Take 1-2 pills every 4-6 hours as needed for pain.   10 tablet   0   . QUEtiapine (SEROQUEL XR) 300 MG 24 hr tablet   Oral   Take 300 mg by mouth at bedtime.         Marland Kitchen QUEtiapine (SEROQUEL) 50 MG tablet   Oral   Take 50 mg by mouth 2 (two) times daily.          BP 132/61  Pulse 84  Temp(Src) 98.3 F (36.8 C) (Oral)  Resp 18  SpO2 99%  LMP 05/17/2013  Physical Exam  Nursing note and vitals reviewed. Constitutional: She is oriented to person, place, and time. She appears well-developed and well-nourished. No distress.  HENT:  Head: Normocephalic and atraumatic.  Right Ear:  External ear normal.  Left Ear: External ear normal.  Nose: Nose normal.  Mouth/Throat: Oropharynx is clear and moist.  Eyes: Conjunctivae are normal.  Neck: Normal range of motion.  Cardiovascular: Normal rate, regular rhythm, normal heart sounds, intact distal pulses and normal pulses.   Pulses:      Dorsalis pedis pulses are 2+ on the right side, and 2+ on the left side.       Posterior tibial pulses are 2+ on the right side, and 2+ on the left side.  Capillary refill < 3 seconds in all toes. Sensation intact.   Pulmonary/Chest: Effort normal and breath sounds normal. No stridor. No respiratory distress. She has no wheezes. She has no rales.  Abdominal: Soft. She exhibits no distension.  Musculoskeletal: Normal range of motion.       Feet:  Compartments soft. Tender to palpation on medial malleolus as well as lateral aspect of foot. Flexion and extension of all toes intact. ROM limited due to pain.    Neurological: She is alert and oriented to person, place, and time. She has normal strength.  Skin: Skin is warm and dry. She is not diaphoretic. No erythema.  Psychiatric: She has a normal mood and affect. Her behavior is normal.    ED Course  Procedures (including critical care time)  DIAGNOSTIC STUDIES: Oxygen Saturation is 99% on RA, normal by my interpretation.    COORDINATION OF CARE: 12:45 PM-Discussed treatment plan which includes foot xray and pain medication with pt at bedside and pt agreed to plan.   Labs Review Labs Reviewed - No data to display Imaging Review Dg Ankle Complete Left  06/10/2013   CLINICAL DATA:  Pain post trauma  EXAM: LEFT ANKLE COMPLETE - 3+ VIEW  COMPARISON:  None.  FINDINGS: Frontal, oblique, and lateral views were obtained. There is no fracture or effusion. Ankle mortise appears intact.  IMPRESSION: No fracture.  Mortise intact.   Electronically Signed   By: Bretta Bang M.D.   On: 06/10/2013 13:36   Dg Foot Complete Left  06/10/2013   CLINICAL DATA:  Pain; recent trauma  EXAM: LEFT FOOT - COMPLETE 3+ VIEW  COMPARISON:  May 22, 2013  FINDINGS: Frontal, oblique, and lateral views were obtained. There is a subtle obliquely oriented fracture through the distal portion of the first proximal phalanx in anatomic alignment. No other evidence of fracture. No dislocation. Joint spaces appear intact. No erosive change.  IMPRESSION: Subtle fracture through the distal portion of the first proximal phalanx in anatomic alignment. No appreciable arthropathy.   Electronically Signed   By: Bretta Bang M.D.   On: 06/10/2013 13:16    EKG Interpretation   None       MDM   1. Phalanx fracture, foot    Patient presents for persistent left foot pain after a fall on 1/15. Today there is a subtle fracture through the distal portion of the first proximal phalanx seen on her left foot x-ray. Neurovascularly intact. Sensation intact. Compartment soft. Range of  motion is limited due to pain. Patient was placed in a postop shoe. She was encouraged to continue to rest, ice, elevate her foot. She was again given orthopedic followup. Return instructions given. Vital signs stable for discharge. Patient / Family / Caregiver informed of clinical course, understand medical decision-making process, and agree with plan.   I personally performed the services described in this documentation, which was scribed in my presence. The recorded information has been reviewed and is accurate.  Mora BellmanHannah S Kamera Dubas, PA-C 06/10/13 1453

## 2013-06-13 NOTE — ED Provider Notes (Signed)
History/physical exam/procedure(s) were performed by non-physician practitioner and as supervising physician I was immediately available for consultation/collaboration. I have reviewed all notes and am in agreement with care and plan.   Hilario Quarryanielle S Dajanae Brophy, MD 06/13/13 [phone]

## 2013-06-25 ENCOUNTER — Telehealth (HOSPITAL_COMMUNITY): Payer: Self-pay

## 2013-06-25 NOTE — ED Notes (Signed)
Patient called having difficulty getting ortho MD to see her.  After talking [facility] Dr [facility], it was decided that Dr Greig RightMurphy's office is the correct office but they need referral from PCP due to insurance requirements. Pt has no PCP. Advised to contact her case worker for assistance. Pt verbalized understanding.

## 2013-07-23 ENCOUNTER — Emergency Department (HOSPITAL_COMMUNITY)
Admission: EM | Admit: 2013-07-23 | Discharge: 2013-07-23 | Disposition: A | Discharge status: Home / Self Care | Payer: Medicaid Other | Attending: Emergency Medicine | Admitting: Emergency Medicine

## 2013-07-23 ENCOUNTER — Encounter (HOSPITAL_COMMUNITY): Payer: Self-pay | Admitting: Emergency Medicine

## 2013-07-23 ENCOUNTER — Emergency Department (HOSPITAL_COMMUNITY): Payer: Medicaid Other

## 2013-07-23 DIAGNOSIS — H9190 Unspecified hearing loss, unspecified ear: Secondary | ICD-10-CM | POA: Insufficient documentation

## 2013-07-23 DIAGNOSIS — G8921 Chronic pain due to trauma: Secondary | ICD-10-CM | POA: Insufficient documentation

## 2013-07-23 DIAGNOSIS — I10 Essential (primary) hypertension: Secondary | ICD-10-CM | POA: Insufficient documentation

## 2013-07-23 DIAGNOSIS — M79675 Pain in left toe(s): Secondary | ICD-10-CM

## 2013-07-23 DIAGNOSIS — F17200 Nicotine dependence, unspecified, uncomplicated: Secondary | ICD-10-CM | POA: Insufficient documentation

## 2013-07-23 DIAGNOSIS — Z79899 Other long term (current) drug therapy: Secondary | ICD-10-CM | POA: Insufficient documentation

## 2013-07-23 DIAGNOSIS — F319 Bipolar disorder, unspecified: Secondary | ICD-10-CM | POA: Insufficient documentation

## 2013-07-23 DIAGNOSIS — Z8709 Personal history of other diseases of the respiratory system: Secondary | ICD-10-CM | POA: Insufficient documentation

## 2013-07-23 DIAGNOSIS — M25579 Pain in unspecified ankle and joints of unspecified foot: Secondary | ICD-10-CM | POA: Insufficient documentation

## 2013-07-23 MED ORDER — HYDROCODONE-ACETAMINOPHEN 5-325 MG PO TABS
1.0000 | ORAL_TABLET | Freq: Once | ORAL | Status: AC
  Administered 2013-07-23: 1 via ORAL
  Filled 2013-07-23: qty 1

## 2013-07-23 NOTE — ED Notes (Signed)
Pt presents with c/o left foot pain. Pt says she slipped on some ice of January of this year and then last month she was diagnosed with a broken foot. Pt says the pain has gotten worse today.

## 2013-07-23 NOTE — ED Notes (Signed)
Pt c/o foot pain following a fracture in her lt foot in January.  Cannot get in to see orthopedist w/o PCP.

## 2013-07-23 NOTE — ED Provider Notes (Signed)
CSN: [id number]     Arrival date & time 07/23/13  1319 History   First MD Initiated Contact with Patient 07/23/13 1420 This chart was scribed for non-physician practitioner Trixie DredgeEmily Malisha Mabey, PA-C working with Audree CamelScott T Goldston, MD by Valera CastleSteven Perry, ED scribe. This patient was seen in room WTR5/WTR5 and the patient's care was started at 2:46 PM.     Chief Complaint  Patient presents with  . Foot Pain   (Consider location/radiation/quality/duration/timing/severity/associated sxs/prior Treatment) The history is provided by the patient. No language interpreter was used.   HPI Comments: Barbara Alvarez is a 48 y.o. female who presents to the Emergency Department complaining of constant, 10/10, left foot pain, after falling on ice in 05/2013. She reports moment of her foot exacerbates her pain, and has trouble ambulating.   05/22/2013 pt had normal xray of left foot. 06/10/2013 pt had subtle fx through the distal portion of 1st proximal phalanx in anatomical alignment.   She was given 12 Percocet that she has taken. She was given post op shoe but has not been wearing it recently due to smell. She has tried washing the boot without success. She has not followed up with Orthopedist. She has tried to get in touch with social worker for referral to orthopedist, but has not been able to get in yet.   She states she cannot take Ibuprofen and Tylenol for her pain due to exacerbated allergies. She has been elevating her foot and applying ice without relief. She denies any new injuries. She denies any other associated symptoms. She denies having PCP.   PCP - Altamese CarolinaMARTIN,TANYA D, MD  Past Medical History  Diagnosis Date  . Bipolar 1 disorder   . Claustrophobia   . Bronchitis   . Hypertension   . Back pain   . Hearing loss in right ear    Past Surgical History  Procedure Laterality Date  . Cholecystectomy    . Ligament repair      left arm   Family History  Problem Relation Age of Onset  . Cancer Mother   .  Hypertension Father   . Diabetes Other    History  Substance Use Topics  . Smoking status: Current Every Day Smoker -- 1.00 packs/day    Types: Cigarettes  . Smokeless tobacco: Never Used  . Alcohol Use: No     Comment: states stopped drinking as New Years Resolution   OB History   Grav Para Term Preterm Abortions TAB SAB Ect Mult Living                 Review of Systems  Musculoskeletal: Positive for arthralgias.  Skin: Negative for color change, rash and wound.  Neurological: Negative for weakness and numbness.  All other systems reviewed and are negative.   Allergies  Haloperidol decanoate; Ibuprofen; and Tramadol  Home Medications   Current Outpatient Rx  Name  Route  Sig  Dispense  Refill  . albuterol (PROVENTIL HFA;VENTOLIN HFA) 108 (90 BASE) MCG/ACT inhaler   Inhalation   Inhale 2 puffs into the lungs every 6 (six) hours as needed for wheezing or shortness of breath.         . carbamazepine (EQUETRO) 200 MG CP12 12 hr capsule   Oral   Take 200 mg by mouth at bedtime.          Marland Kitchen. losartan (COZAAR) 25 MG tablet   Oral   Take 25 mg by mouth daily.         .Marland Kitchen  QUEtiapine (SEROQUEL XR) 300 MG 24 hr tablet   Oral   Take 300 mg by mouth at bedtime.          BP 121/61  Pulse 91  Temp(Src) 98.1 F (36.7 C)  Resp 18  SpO2 100%  Physical Exam  Nursing note and vitals reviewed. Constitutional: She appears well-developed and well-nourished. No distress.  HENT:  Head: Normocephalic and atraumatic.  Neck: Neck supple.  Pulmonary/Chest: Effort normal.  Musculoskeletal:  Left foot with diffuse tenderness through first phalanx and metatarsal. No erythema, edema, warmth. Cap refill < 3 seconds. Sensation intact. No focal tenderness.   Neurological: She is alert.  Skin: She is not diaphoretic.    ED Course  Procedures (including critical care time)  DIAGNOSTIC STUDIES: Oxygen Saturation is 100% on room air, normal by my interpretation.    COORDINATION  OF CARE: 05/22/2013 pt had normal xray of left foot. 06/10/2013 pt had subtle fx through the distal portion of 1st proximal phalanx in anatomical alignment.   2:51 PM-Discussed treatment plan which includes DG left foot with pt at bedside and pt agreed to plan.   Labs Review Labs Reviewed - No data to display Imaging Review Dg Foot Complete Left  07/23/2013   CLINICAL DATA:  Toe pain.  History of fracture of great toe  EXAM: LEFT FOOT - COMPLETE 3+ VIEW  COMPARISON:  06/10/2013  FINDINGS: Negative for fracture. Normal alignment and no significant arthropathy. Questioned fracture of the proximal first phalanx on the prior study is not visualized on today's study  IMPRESSION: Negative   Electronically Signed   By: Marlan Palau M.D.   On: 07/23/2013 15:49    EKG Interpretation None     Medications  HYDROcodone-acetaminophen (NORCO/VICODIN) 5-325 MG per tablet 1 tablet (not administered)   MDM   Final diagnoses:  Toe pain, left    Pt with persistent pain in left great toe x 2 weeks to slipping and falling on ice. She initially had an x-ray that was negative followed by an x-ray that showed a very slight fracture in anatomical position. Today's x-ray shows no fracture. Patient has a postop shoe that she can wear. Have advised that she no longer drinks to buddy tape her toes. She was advised to do RICE treatment and followup with her primary care provider.  Discussed result, findings, treatment, and follow up  with patient.  Pt given return precautions.  Pt verbalizes understanding and agrees with plan.      I personally performed the services described in this documentation, which was scribed in my presence. The recorded information has been reviewed and is accurate.    Trixie Dredge, PA-C 07/23/13 1625

## 2013-07-23 NOTE — ED Notes (Signed)
Pt states she is taking a taxi home.

## 2013-07-23 NOTE — Discharge Instructions (Signed)
Read the information below.  You may return to the Emergency Department at any time for worsening condition or any new symptoms that concern you.  If you develop uncontrolled pain, weakness or numbness of the extremity, severe discoloration of the skin, or you are unable to walk, return to the ER for a recheck.      Musculoskeletal Pain Musculoskeletal pain is muscle and boney aches and pains. These pains can occur in any part of the body. Your caregiver may treat you without knowing the cause of the pain. They may treat you if blood or urine tests, X-rays, and other tests were normal.  CAUSES There is often not a definite cause or reason for these pains. These pains may be caused by a type of germ (virus). The discomfort may also come from overuse. Overuse includes working out too hard when your body is not fit. Boney aches also come from weather changes. Bone is sensitive to atmospheric pressure changes. HOME CARE INSTRUCTIONS   Ask when your test results will be ready. Make sure you get your test results.  Only take over-the-counter or prescription medicines for pain, discomfort, or fever as directed by your caregiver. If you were given medications for your condition, do not drive, operate machinery or power tools, or sign legal documents for 24 hours. Do not drink alcohol. Do not take sleeping pills or other medications that may interfere with treatment.  Continue all activities unless the activities cause more pain. When the pain lessens, slowly resume normal activities. Gradually increase the intensity and duration of the activities or exercise.  During periods of severe pain, bed rest may be helpful. Lay or sit in any position that is comfortable.  Putting ice on the injured area.  Put ice in a bag.  Place a towel between your skin and the bag.  Leave the ice on for 15 to 20 minutes, 3 to 4 times a day.  Follow up with your caregiver for continued problems and no reason can be found  for the pain. If the pain becomes worse or does not go away, it may be necessary to repeat tests or do additional testing. Your caregiver may need to look further for a possible cause. SEEK IMMEDIATE MEDICAL CARE IF:  You have pain that is getting worse and is not relieved by medications.  You develop chest pain that is associated with shortness or breath, sweating, feeling sick to your stomach (nauseous), or throw up (vomit).  Your pain becomes localized to the abdomen.  You develop any new symptoms that seem different or that concern you. MAKE SURE YOU:   Understand these instructions.  Will watch your condition.  Will get help right away if you are not doing well or get worse. Document Released: 04/24/2005 Document Revised: 07/17/2011 Document Reviewed: 12/27/2012 [facility] Patient Information 2014 [place], [place].   Emergency Department Resource Guide 1) Find a Doctor and Pay Out of Pocket Although you won't have to find out who is covered by your insurance plan, it is a good idea to ask around and get recommendations. You will then need to call the office and see if the doctor you have chosen will accept you as a new patient and what types of options they offer for patients who are self-pay. Some doctors offer discounts or will set up payment plans for their patients who do not have insurance, but you will need to ask so you aren't surprised when you get to your appointment.  2) Contact  Your Local Health Department Not all health departments have doctors that can see patients for sick visits, but many do, so it is worth a call to see if yours does. If you don't know where your local health department is, you can check in your phone book. The CDC also has a tool to help you locate your state's health department, and many state websites also have listings of all of their local health departments.  3) Find a Walk-in Clinic If your illness is not likely to be very severe or complicated,  you may want to try a walk in clinic. These are popping up all over the country in pharmacies, drugstores, and shopping centers. They're usually staffed by nurse practitioners or physician assistants that have been trained to treat common illnesses and complaints. They're usually fairly quick and inexpensive. However, if you have serious medical issues or chronic medical problems, these are probably not your best option.  No Primary Care Doctor: - Call Health Connect at  [phone] - they can help you locate a primary care doctor that  accepts your insurance, provides certain services, etc. - Physician Referral Service- [phone]  Chronic Pain Problems: Organization         Address  Phone   Notes  [facility]  [phone] Patients need to be referred by their primary care doctor.   Medication Assistance: Organization         Address  Phone   Notes  [facility] Medication [facility] [address] [place], [place] [address] [phone] --Must be a resident of [facility] -- Must have NO insurance coverage whatsoever (no Medicaid/ Medicare, etc.) -- The pt. MUST have a primary care doctor that directs their care regularly and follows them in the community   [facility]  [phone]   [employer]  [phone]    Agencies that provide inexpensive medical care: Organization         Address  Phone   Notes  [facility]  [phone]   [facility]    [phone]   [facility] [address] [place], [place] [address] [phone]   [facility] [address] [place] [place] [phone]   [facility]anned Parenthood    [phone]   [facility]    [phone]   [facility]  [address], [place] Phone:  [phone], Fax:  [phone] Hours of Operation:  9 am - 6 pm, M-F.  Also accepts Medicaid/Medicare and  self-pay.  [facility]  [address], [place] Phone: [phone], Fax: [phone]. Hours of Operation:  8:30 am - 5:30 pm, M-F.  Also accepts Medicaid and self-pay.  [facility] [place] [address], [place] Phone: [phone]   [facility] [address] Natasha Bence [place], [place] [phone], Ext. [phone] Mondays & Thursdays: 7-9 AM.  First 15 patients are seen on a first come, first serve basis.    Medicaid-accepting [facility] Providers:  Organization         Address  Phone   Notes  [facility] [address],  [phone] Also accepts self-pay patients.  [facility] [address] Laurell Josephs [place], [place]  [phone]   [facility] [address], [address] (  [phone]   [facility] [address] [place] [phone]   Renaye Rakers [address], [place]   [phone] Only accepts [place] patients after they have their name applied to their card.   Self-Pay (no insurance) in [facility]:  Organization         Address  Phone   Notes  Sickle Cell Patients, [facility] [address] [place], [place] [phone]   [facility] [address] [place], [place] [phone]   [facility]  [address], [place] [phone]   [facility]/Dr. Osei-Bonsu  [address] [place] or [address], [place] [phone] Phone number for both [place] and [place] locations is the same.  [facility] [address], [place] [phone]   [facility] [address] [place] or [address] [phone]   [facility] [address] [place] [phone], phone;  [phone], fax Sees patients 1st and 3rd Saturday of every month.  Must not qualify for public or private insurance (i.e. Medicaid, Medicare, [place] Health Choice, Veterans' Benefits)  Household income should be no more than 200% of the poverty level The clinic cannot treat you if you are pregnant or think you are pregnant  Sexually transmitted diseases are not treated at the clinic.    Dental Care: Organization         Address  Phone  Notes  [facility] Department of [facility] [address] [place], [place] [phone] Accepts children up to age 101 who are enrolled in [place] or [place] Health Choice; pregnant women with a Medicaid card; and children who have applied for Medicaid or [place] Health Choice, but were declined, whose parents can pay a reduced fee at time of service.  [facility] Department of [facility]  [address], [place] [phone] Accepts children up to age 40 who are enrolled in [place] or [place] Health Choice; pregnant women with a Medicaid card; and children who have applied for Medicaid or [place] Health Choice, but were declined, whose parents can pay a reduced fee at time of service.  [facility]  [address] [place], [place] [phone] Patients are seen by appointment only. Walk-ins are not accepted. [facility] will see patients 74 years of age and older. Monday - Tuesday (8am-5pm) Most Wednesdays (8:30-5pm) $30 per visit, cash only  [facility] Adult Dental Access PROGRAM  [address], [facility] [phone] Patients are seen by appointment only. Walk-ins are not accepted. [facility] will see patients 56 years of age and older. One Wednesday Evening (Monthly: Volunteer Based).  $30 per visit, cash only  [employer]  [phone] for adults; Children under age 62, call Graduate Pediatric Dentistry at [phone]. Children aged 38-14, please call  [phone] to request a pediatric application.  Dental services are provided in all areas of dental care including fillings, crowns and bridges, complete and partial dentures, implants, gum treatment, root canals, and extractions. Preventive care is also provided. Treatment is provided to both adults and children. Patients are selected via a lottery and there is often a waiting list.   [facility] [address], Ginette Otto  [phone] [email or web address]   [facility] [address], [place], [place] [phone], Ext. [phone] Second and Fourth Thursday of each month, opens at 6:30 AM; Clinic ends at 9 AM.  Patients are seen on a first-come first-served basis, and a limited number are seen during each clinic.   [facility]  [address], [place], [place] [phone]   Eligibility Requirements You must have lived in [place], [place], or [place] counties for at least the last three months.   You cannot be eligible for state or federal sponsored [place] Cityhealthcare insurance, including CIG[employer]eterans Administration, [place]icaid, or [employer]icare.   You generally cannot be eligible for healthcare insurance through your employer.    How to apply: Eligibility screenings are held every Tuesday and Wednesday afternoon from 1:00 pm until 4:00 pm. You do not need an appointment for the interview!  [facility] [address], [place], [place] [phone]   [facility]  [phone]   [facility]  [phone]   [facility]  [phone]    Behavioral Health Resources in the Community: Intensive Outpatient Programs Organization         Address  Phone  Notes  [facility] [address], [place], [place] [phone]   [facility] [address], [place], [place] [phone]   [facility] [address], [place], [place]   [phone]   [facility] [address],  [place], [place] [phone] or [phone]   Substance Abuse Resources Organization         Address  Phone  Notes  [facility]  [phone]   [facility]  [phone]   [facility]  [phone]   [facility]  [phone]   Residential & Outpatient Substance Abuse Program  [phone]   Psychological Services Organization         Address  Phone  Notes  [facility]  [phone]- [phone]   [facility]  [phone]- [phone]   [facility] [address], [place] [phone] or [phone]    Mobile Crisis Teams Organization         Address  Phone  Notes  [facility], Mobile Crisis Care Unit  [phone]   [facility]  [address] [place], [place] [phone]   Doristine LocksSharon DeEsch [address] [place] [phone]    Self-Help/Support Groups Organization         Address  Phone             Notes  [facility] [place] - variety of support groups  [phone]- [phone] Call for more information  Narcotics Anonymous (NA), Caring [facility] [address], [place]  2 meetings at this location   Statisticianesidential Treatment Programs Organization         Address  Phone  Notes  [facility] [address],    [place]  [phone]   [facility]  [address], [place] [phone], [place], [place] [phone]   [facility] [address], [place] [phone] Admissions: 8am-3pm M-F  [facility] [address]    [place], [place] [phone]   [facility] [address], [place], [place] [phone]   [facility] [address]  [place], [place] [phone]   [facility] -  Intensive Outpatient [address]urell [address], [place], [place] [phone]   [facility] (Addiction Recovery  Care Assoc[facility] [address]  [place], [place] [phone] or [phone]   [facility] ([facility]) [address] [place], [place] [phone] Accepts Medicaid  [facility] [address],  [place] [phone] Substance Abuse/Addiction Treatment   [facility] Organization         Address  Phone  Notes  [facility]  [phone]   Angie Fava, PhD [address] Ervin Knack [place], [place]   [phone] or [phone]   [facility] Behavioral   [address] [place], [place] [phone]   [facility] [address] [place], [place] [phone] Insurance/Medicaid/sponsorship through [facility] and Families [address]                                    [place], [place] [phone] Therapy/tele-psych/case  [facility] [address], [place] [phone]    Dr. Lolly Mustache  [phone]   [facility]  [employer] [facility] Dept. 1) [address] [place] 2) [address], [place] 3)  [address], [place] [phone]  [phone]   [facility] Child Abuse Hotline 774-465-9935 or [phone] (After Hours)

## 2013-07-24 NOTE — ED Provider Notes (Signed)
Medical screening examination/treatment/procedure(s) were performed by non-physician practitioner and as supervising physician I was immediately available for consultation/collaboration.   EKG Interpretation None        Audree CamelScott T Willadene Mounsey, MD 07/24/13 [phone]

## 2013-10-02 ENCOUNTER — Other Ambulatory Visit (HOSPITAL_COMMUNITY): Payer: Self-pay | Admitting: Orthopedic Surgery

## 2013-10-02 DIAGNOSIS — R52 Pain, unspecified: Secondary | ICD-10-CM

## 2013-10-15 ENCOUNTER — Ambulatory Visit (HOSPITAL_COMMUNITY): Admission: RE | Admit: 2013-10-15 | Payer: Medicaid Other | Source: Ambulatory Visit

## 2013-12-09 ENCOUNTER — Encounter (HOSPITAL_COMMUNITY): Payer: Self-pay | Admitting: Emergency Medicine

## 2013-12-09 ENCOUNTER — Emergency Department (HOSPITAL_COMMUNITY): Payer: Medicaid Other

## 2013-12-09 ENCOUNTER — Emergency Department (HOSPITAL_COMMUNITY)
Admission: EM | Admit: 2013-12-09 | Discharge: 2013-12-09 | Disposition: A | Discharge status: Home / Self Care | Payer: Medicaid Other | Attending: Emergency Medicine | Admitting: Emergency Medicine

## 2013-12-09 DIAGNOSIS — F17200 Nicotine dependence, unspecified, uncomplicated: Secondary | ICD-10-CM | POA: Diagnosis not present

## 2013-12-09 DIAGNOSIS — W010XXA Fall on same level from slipping, tripping and stumbling without subsequent striking against object, initial encounter: Secondary | ICD-10-CM | POA: Diagnosis not present

## 2013-12-09 DIAGNOSIS — M546 Pain in thoracic spine: Secondary | ICD-10-CM

## 2013-12-09 DIAGNOSIS — W19XXXA Unspecified fall, initial encounter: Secondary | ICD-10-CM

## 2013-12-09 DIAGNOSIS — W108XXA Fall (on) (from) other stairs and steps, initial encounter: Secondary | ICD-10-CM | POA: Insufficient documentation

## 2013-12-09 DIAGNOSIS — Y9289 Other specified places as the place of occurrence of the external cause: Secondary | ICD-10-CM | POA: Diagnosis not present

## 2013-12-09 DIAGNOSIS — I10 Essential (primary) hypertension: Secondary | ICD-10-CM | POA: Insufficient documentation

## 2013-12-09 DIAGNOSIS — Z8739 Personal history of other diseases of the musculoskeletal system and connective tissue: Secondary | ICD-10-CM | POA: Diagnosis not present

## 2013-12-09 DIAGNOSIS — Z8709 Personal history of other diseases of the respiratory system: Secondary | ICD-10-CM | POA: Diagnosis not present

## 2013-12-09 DIAGNOSIS — Y9389 Activity, other specified: Secondary | ICD-10-CM | POA: Insufficient documentation

## 2013-12-09 DIAGNOSIS — Z79899 Other long term (current) drug therapy: Secondary | ICD-10-CM | POA: Insufficient documentation

## 2013-12-09 DIAGNOSIS — F319 Bipolar disorder, unspecified: Secondary | ICD-10-CM | POA: Insufficient documentation

## 2013-12-09 DIAGNOSIS — Z8669 Personal history of other diseases of the nervous system and sense organs: Secondary | ICD-10-CM | POA: Insufficient documentation

## 2013-12-09 DIAGNOSIS — IMO0002 Reserved for concepts with insufficient information to code with codable children: Secondary | ICD-10-CM | POA: Diagnosis present

## 2013-12-09 MED ORDER — OXYCODONE-ACETAMINOPHEN 5-325 MG PO TABS: 1.0000 | ORAL_TABLET | Freq: Three times a day (TID) | ORAL | Status: DC | PRN

## 2013-12-09 MED ORDER — CYCLOBENZAPRINE HCL 10 MG PO TABS: 10.0000 mg | ORAL_TABLET | Freq: Three times a day (TID) | ORAL | Status: DC | PRN

## 2013-12-09 MED ORDER — OXYCODONE-ACETAMINOPHEN 5-325 MG PO TABS
1.0000 | ORAL_TABLET | Freq: Once | ORAL | Status: AC
  Administered 2013-12-09: 1 via ORAL
  Filled 2013-12-09: qty 1

## 2013-12-09 NOTE — ED Notes (Signed)
Pt c/o left back pain onset yesterday. Pt reports that she has weak steps at her apartment complex and she fell. Pt tearful in triage.

## 2013-12-09 NOTE — ED Provider Notes (Signed)
I saw and evaluated the patient, reviewed the resident's note and I agree with the findings and plan.  Pt c/o low back pain post fall yesterday. Lumbar muscular tenderness. CTLS spine, non tender, aligned, no step off.    Suzi RootsKevin E Jerilee Space, MD 12/09/13 [phone]

## 2013-12-09 NOTE — Discharge Instructions (Signed)
Xrays were negative. Use the Flexeril and Pain medication as needed.  Back Pain, Adult Low back pain is very common. About 1 in 5 people have back pain.The cause of low back pain is rarely dangerous. The pain often gets better over time.About half of people with a sudden onset of back pain feel better in just 2 weeks. About 8 in 10 people feel better by 6 weeks.  CAUSES Some common causes of back pain include:  Strain of the muscles or ligaments supporting the spine.  Wear and tear (degeneration) of the spinal discs.  Arthritis.  Direct injury to the back. DIAGNOSIS Most of the time, the direct cause of low back pain is not known.However, back pain can be treated effectively even when the exact cause of the pain is unknown.Answering your caregiver's questions about your overall health and symptoms is one of the most accurate ways to make sure the cause of your pain is not dangerous. If your caregiver needs more information, he or she may order lab work or imaging tests (X-rays or MRIs).However, even if imaging tests show changes in your back, this usually does not require surgery. HOME CARE INSTRUCTIONS For many people, back pain returns.Since low back pain is rarely dangerous, it is often a condition that people can learn to [facility]on their own.   Remain active. It is stressful on the back to sit or stand in one place. Do not sit, drive, or stand in one place for more than 30 minutes at a time. Take short walks on level surfaces as soon as pain allows.Try to increase the length of time you walk each day.  Do not stay in bed.Resting more than 1 or 2 days can delay your recovery.  Do not avoid exercise or work.Your body is made to move.It is not dangerous to be active, even though your back may hurt.Your back will likely heal faster if you return to being active before your pain is gone.  Pay attention to your body when you bend and lift. Many people have less discomfortwhen  lifting if they bend their knees, keep the load close to their bodies,and avoid twisting. Often, the most comfortable positions are those that put less stress on your recovering back.  Find a comfortable position to sleep. Use a firm mattress and lie on your side with your knees slightly bent. If you lie on your back, put a pillow under your knees.  Only take over-the-counter or prescription medicines as directed by your caregiver. Over-the-counter medicines to reduce pain and inflammation are often the most helpful.Your caregiver may prescribe muscle relaxant drugs.These medicines help dull your pain so you can more quickly return to your normal activities and healthy exercise.  Put ice on the injured area.  Put ice in a plastic bag.  Place a towel between your skin and the bag.  Leave the ice on for 15-20 minutes, 03-04 times a day for the first 2 to 3 days. After that, ice and heat may be alternated to reduce pain and spasms.  Ask your caregiver about trying back exercises and gentle massage. This may be of some benefit.  Avoid feeling anxious or stressed.Stress increases muscle tension and can worsen back pain.It is important to recognize when you are anxious or stressed and learn ways to manage it.Exercise is a great option. SEEK MEDICAL CARE IF:  You have pain that is not relieved with rest or medicine.  You have pain that does not improve in 1 week.  You have new symptoms.  You are generally not feeling well. SEEK IMMEDIATE MEDICAL CARE IF:   You have pain that radiates from your back into your legs.  You develop new bowel or bladder control problems.  You have unusual weakness or numbness in your arms or legs.  You develop nausea or vomiting.  You develop abdominal pain.  You feel faint. Document Released: 04/24/2005 Document Revised: 10/24/2011 Document Reviewed: 08/26/2013 [facility] Patient Information 2015 [place], [place]. This information is not intended to  replace advice given to you by your health care provider. Make sure you discuss any questions you have with your health care provider.

## 2013-12-09 NOTE — ED Provider Notes (Signed)
CSN: [id number]     Arrival date & time 12/09/13  0950 History   First MD Initiated Contact with Patient 12/09/13 1022     Chief Complaint  Patient presents with  . Back Pain  . Fall   (Consider location/radiation/quality/duration/timing/severity/associated sxs/prior Treatment) HPI 48 year old female with HTN and bipolar disorder presents with complaints of back pain following fall yesterday.  Patient reports that she slipped while going down the stairs yesterday and landed on her mid-lower back.  She reports that she had signficant pain initially, but states that it was manageable.  This morning, her pain worsened and was so severe she had difficulty getting out of bed.  Pain is currently severe but improved following recent Percocet (given in ED).   She denies any radicular symptoms, LE weakness, saddle anesthesia, incontinence.   Past Medical History  Diagnosis Date  . Bipolar 1 disorder   . Claustrophobia   . Bronchitis   . Hypertension   . Back pain   . Hearing loss in right ear    Past Surgical History  Procedure Laterality Date  . Cholecystectomy    . Ligament repair      left arm   Family History  Problem Relation Age of Onset  . Cancer Mother   . Hypertension Father   . Diabetes Other    History  Substance Use Topics  . Smoking status: Current Every Day Smoker -- 1.00 packs/day    Types: Cigarettes  . Smokeless tobacco: Never Used  . Alcohol Use: No     Comment: states stopped drinking as New Years Resolution   OB History   Grav Para Term Preterm Abortions TAB SAB Ect Mult Living                 Review of Systems  Constitutional: Negative.   Respiratory: Negative for chest tightness.   Cardiovascular: Negative for chest pain.  Gastrointestinal: Negative for nausea, vomiting and abdominal pain.  Genitourinary: Negative for difficulty urinating.  Musculoskeletal: Positive for back pain.  Neurological: Negative for weakness and numbness.   Allergies   Haloperidol decanoate; Ibuprofen; Flexeril; Naproxen; and Tramadol  Home Medications   Prior to Admission medications   Medication Sig Start Date End Date Taking? Authorizing Provider  albuterol (PROVENTIL HFA;VENTOLIN HFA) 108 (90 BASE) MCG/ACT inhaler Inhale 2 puffs into the lungs every 6 (six) hours as needed for wheezing or shortness of breath.   Yes Historical Provider, MD  carbamazepine (EQUETRO) 200 MG CP12 12 hr capsule Take 200 mg by mouth at bedtime.    Yes Historical Provider, MD  losartan (COZAAR) 25 MG tablet Take 25 mg by mouth daily.   Yes Historical Provider, MD  QUEtiapine (SEROQUEL XR) 300 MG 24 hr tablet Take 300 mg by mouth at bedtime.   Yes Historical Provider, MD   BP 123/76  Pulse 82  Temp(Src) 97.9 F (36.6 C) (Oral)  Resp 24  Ht 5\' 7"  (1.702 m)  Wt 145 lb (65.772 kg)  BMI 22.71 kg/m2  SpO2 100%  LMP 11/24/2013 Physical Exam  Constitutional: She is oriented to person, place, and time. She appears well-developed and well-nourished.  HENT:  Head: Normocephalic and atraumatic.  Cardiovascular: Normal rate and regular rhythm.   Pulmonary/Chest: Effort normal and breath sounds normal. No respiratory distress.  Neurological: She is alert and oriented to person, place, and time.  Back - Thoracic and Lumbar spine - paraspinous muscle spasm noted.  No spinous process tenderness. Decreased ROM secondary to  pain.   ED Course  Procedures (including critical care time) Labs Review Labs Reviewed - No data to display  Imaging Review Dg Thoracic Spine 2 View  12/09/2013   CLINICAL DATA:  Recent traumatic injury with pain  EXAM: THORACIC SPINE - 2 VIEW  COMPARISON:  None.  FINDINGS: There is no evidence of thoracic spine fracture. Alignment is normal. No other significant bone abnormalities are identified.  IMPRESSION: No acute abnormality is noted.   Electronically Signed   By: Alcide Clever M.D.   On: 12/09/2013 11:53   Dg Lumbar Spine 2-3 Views  12/09/2013   CLINICAL  DATA:  Recent traumatic injury with low back pain  EXAM: LUMBAR SPINE - 2-3 VIEW  COMPARISON:  None.  FINDINGS: There is no evidence of lumbar spine fracture. Alignment is normal. Intervertebral disc spaces are maintained.  IMPRESSION: No acute abnormality is noted.   Electronically Signed   By: Alcide Clever M.D.   On: 12/09/2013 11:49     EKG Interpretation None     MDM   Final diagnoses:  Thoracic back pain, unspecified back pain laterality  Fall, initial encounter   48 year old female presents with back pain after suffering a fall. - Xrays negative today. - Patient with significant spasm.  Will treat with Flexeril. - Will also treat acute pain with Percocet 5-325 (#20). - Patient stable for discharge home.     Tommie Sams, DO 12/09/13 1206

## 2013-12-18 ENCOUNTER — Ambulatory Visit (HOSPITAL_COMMUNITY): Admission: RE | Admit: 2013-12-18 | Payer: Medicaid Other | Source: Ambulatory Visit

## 2014-01-27 ENCOUNTER — Ambulatory Visit (HOSPITAL_COMMUNITY): Admission: RE | Admit: 2014-01-27 | Payer: Medicaid Other | Source: Ambulatory Visit

## 2014-02-22 ENCOUNTER — Encounter (HOSPITAL_COMMUNITY): Payer: Self-pay | Admitting: Emergency Medicine

## 2014-02-22 ENCOUNTER — Emergency Department (HOSPITAL_COMMUNITY): Payer: Medicaid Other

## 2014-02-22 ENCOUNTER — Emergency Department (HOSPITAL_COMMUNITY)
Admission: EM | Admit: 2014-02-22 | Discharge: 2014-02-22 | Disposition: A | Discharge status: Home / Self Care | Payer: Medicaid Other | Attending: Emergency Medicine | Admitting: Emergency Medicine

## 2014-02-22 DIAGNOSIS — W1831XA Fall on same level due to stepping on an object, initial encounter: Secondary | ICD-10-CM | POA: Insufficient documentation

## 2014-02-22 DIAGNOSIS — H9191 Unspecified hearing loss, right ear: Secondary | ICD-10-CM | POA: Insufficient documentation

## 2014-02-22 DIAGNOSIS — Z8659 Personal history of other mental and behavioral disorders: Secondary | ICD-10-CM | POA: Diagnosis not present

## 2014-02-22 DIAGNOSIS — S99911A Unspecified injury of right ankle, initial encounter: Secondary | ICD-10-CM | POA: Diagnosis not present

## 2014-02-22 DIAGNOSIS — F319 Bipolar disorder, unspecified: Secondary | ICD-10-CM | POA: Diagnosis not present

## 2014-02-22 DIAGNOSIS — Z720 Tobacco use: Secondary | ICD-10-CM | POA: Insufficient documentation

## 2014-02-22 DIAGNOSIS — M62838 Other muscle spasm: Secondary | ICD-10-CM | POA: Insufficient documentation

## 2014-02-22 DIAGNOSIS — Y9289 Other specified places as the place of occurrence of the external cause: Secondary | ICD-10-CM | POA: Insufficient documentation

## 2014-02-22 DIAGNOSIS — I10 Essential (primary) hypertension: Secondary | ICD-10-CM | POA: Insufficient documentation

## 2014-02-22 DIAGNOSIS — Y9389 Activity, other specified: Secondary | ICD-10-CM | POA: Insufficient documentation

## 2014-02-22 DIAGNOSIS — M25571 Pain in right ankle and joints of right foot: Secondary | ICD-10-CM

## 2014-02-22 MED ORDER — OXYCODONE-ACETAMINOPHEN 5-325 MG PO TABS
1.0000 | ORAL_TABLET | Freq: Once | ORAL | Status: AC
  Administered 2014-02-22: 1 via ORAL
  Filled 2014-02-22: qty 1

## 2014-02-22 MED ORDER — OXYCODONE-ACETAMINOPHEN 5-325 MG PO TABS: 1.0000 | ORAL_TABLET | Freq: Once | ORAL | Status: DC

## 2014-02-22 MED ORDER — DIAZEPAM 5 MG PO TABS: 5.0000 mg | ORAL_TABLET | Freq: Three times a day (TID) | ORAL | Status: DC | PRN

## 2014-02-22 NOTE — Discharge Instructions (Signed)
Read the information below.  Use the prescribed medication as directed.  Please discuss all new medications with your pharmacist.  You may return to the Emergency Department at any time for worsening condition or any new symptoms that concern you.  If there is any possibility that you might be pregnant, please let your health care provider know and discuss this with the pharmacist to ensure medication safety.  If you develop uncontrolled pain, weakness or numbness of the extremity, severe discoloration of the skin, or you are unable to walk or move your arms, return to the ER for a recheck.       Muscle Cramps and Spasms Muscle cramps and spasms occur when a muscle or muscles tighten and you have no control over this tightening (involuntary muscle contraction). They are a common problem and can develop in any muscle. The most common place is in the calf muscles of the leg. Both muscle cramps and muscle spasms are involuntary muscle contractions, but they also have differences:   Muscle cramps are sporadic and painful. They may last a few seconds to a quarter of an hour. Muscle cramps are often more forceful and last longer than muscle spasms.  Muscle spasms may or may not be painful. They may also last just a few seconds or much longer. CAUSES  It is uncommon for cramps or spasms to be due to a serious underlying problem. In many cases, the cause of cramps or spasms is unknown. Some common causes are:   Overexertion.   Overuse from repetitive motions (doing the same thing over and over).   Remaining in a certain position for a long period of time.   Improper preparation, form, or technique while performing a sport or activity.   Dehydration.   Injury.   Side effects of some medicines.   Abnormally low levels of the salts and ions in your blood (electrolytes), especially potassium and calcium. This could happen if you are taking water pills (diuretics) or you are pregnant.  Some  underlying medical problems can make it more likely to develop cramps or spasms. These include, but are not limited to:   Diabetes.   Parkinson disease.   Hormone disorders, such as thyroid problems.   Alcohol abuse.   Diseases specific to muscles, joints, and bones.   Blood vessel disease where not enough blood is getting to the muscles.  HOME CARE INSTRUCTIONS   Stay well hydrated. Drink enough water and fluids to keep your urine clear or pale yellow.  It may be helpful to massage, stretch, and relax the affected muscle.  For tight or tense muscles, use a warm towel, heating pad, or hot shower water directed to the affected area.  If you are sore or have pain after a cramp or spasm, applying ice to the affected area may relieve discomfort.  Put ice in a plastic bag.  Place a towel between your skin and the bag.  Leave the ice on for 15-20 minutes, 03-04 times a day.  Medicines used to treat a known cause of cramps or spasms may help reduce their frequency or severity. Only take over-the-counter or prescription medicines as directed by your caregiver. SEEK MEDICAL CARE IF:  Your cramps or spasms get more severe, more frequent, or do not improve over time.  MAKE SURE YOU:   Understand these instructions.  Will watch your condition.  Will get help right away if you are not doing well or get worse. Document Released: 10/14/2001 Document Revised:  08/19/2012 Document Reviewed: 04/10/2012 ExitCare Patient Information 2015 [place], [place]. This information is not intended to replace advice given to you by your health care provider. Make sure you discuss any questions you have with your health care provider.  Ankle Pain Ankle pain is a common symptom. The bones, cartilage, tendons, and muscles of the ankle joint perform a lot of work each day. The ankle joint holds your body weight and allows you to move around. Ankle pain can occur on either side or back of 1 or both  ankles. Ankle pain may be sharp and burning or dull and aching. There may be tenderness, stiffness, redness, or warmth around the ankle. The pain occurs more often when a person walks or puts pressure on the ankle. CAUSES  There are many reasons ankle pain can develop. It is important to work with your caregiver to identify the cause since many conditions can impact the bones, cartilage, muscles, and tendons. Causes for ankle pain include:  Injury, including a break (fracture), sprain, or strain often due to a fall, sports, or a high-impact activity.  Swelling (inflammation) of a tendon (tendonitis).  Achilles tendon rupture.  Ankle instability after repeated sprains and strains.  Poor foot alignment.  Pressure on a nerve (tarsal tunnel syndrome).  Arthritis in the ankle or the lining of the ankle.  Crystal formation in the ankle (gout or pseudogout). DIAGNOSIS  A diagnosis is based on your medical history, your symptoms, results of your physical exam, and results of diagnostic tests. Diagnostic tests may include X-ray exams or a computerized magnetic scan (magnetic resonance imaging, MRI). TREATMENT  Treatment will depend on the cause of your ankle pain and may include:  Keeping pressure off the ankle and limiting activities.  Using crutches or other walking support (a cane or brace).  Using rest, ice, compression, and elevation.  Participating in physical therapy or home exercises.  Wearing shoe inserts or special shoes.  Losing weight.  Taking medications to reduce pain or swelling or receiving an injection.  Undergoing surgery. HOME CARE INSTRUCTIONS   Only take over-the-counter or prescription medicines for pain, discomfort, or fever as directed by your caregiver.  Put ice on the injured area.  Put ice in a plastic bag.  Place a towel between your skin and the bag.  Leave the ice on for 15-20 minutes at a time, 03-04 times a day.  Keep your leg raised  (elevated) when possible to lessen swelling.  Avoid activities that cause ankle pain.  Follow specific exercises as directed by your caregiver.  Record how often you have ankle pain, the location of the pain, and what it feels like. This information may be helpful to you and your caregiver.  Ask your caregiver about returning to work or sports and whether you should drive.  Follow up with your caregiver for further examination, therapy, or testing as directed. SEEK MEDICAL CARE IF:   Pain or swelling continues or worsens beyond 1 week.  You have an oral temperature above 102 F (38.9 C).  You are feeling unwell or have chills.  You are having an increasingly difficult time with walking.  You have loss of sensation or other new symptoms.  You have questions or concerns. MAKE SURE YOU:   Understand these instructions.  Will watch your condition.  Will get help right away if you are not doing well or get worse. Document Released: 10/12/2009 Document Revised: 07/17/2011 Document Reviewed: 10/12/2009 [employer]ExitCare Patient Information 2015 [place]Care, [place]. This information  is not intended to replace advice given to you by your health care provider. Make sure you discuss any questions you have with your health care provider.    Emergency Department Resource Guide 1) Find a Doctor and Pay Out of Pocket Although you won't have to find out who is covered by your insurance plan, it is a good idea to ask around and get recommendations. You will then need to call the office and see if the doctor you have chosen will accept you as a new patient and what types of options they offer for patients who are self-pay. Some doctors offer discounts or will set up payment plans for their patients who do not have insurance, but you will need to ask so you aren't surprised when you get to your appointment.  2) Contact Your Local Health Department Not all health departments have doctors that can see  patients for sick visits, but many do, so it is worth a call to see if yours does. If you don't know where your local health department is, you can check in your phone book. The CDC also has a tool to help you locate your state's health department, and many state websites also have listings of all of their local health departments.  3) Find a Walk-in Clinic If your illness is not likely to be very severe or complicated, you may want to try a walk in clinic. These are popping up all over the country in pharmacies, drugstores, and shopping centers. They're usually staffed by nurse practitioners or physician assistants that have been trained to treat common illnesses and complaints. They're usually fairly quick and inexpensive. However, if you have serious medical issues or chronic medical problems, these are probably not your best option.  No Primary Care Doctor: - Call Health Connect at  [phone] - they can help you locate a primary care doctor that  accepts your insurance, provides certain services, etc. - Physician Referral Service- [phone]  Chronic Pain Problems: Organization         Address  Phone   Notes  [facility]  [phone] Patients need to be referred by their primary care doctor.   Medication Assistance: Organization         Address  Phone   Notes  [facility] Medication [facility] [address] [place], [place] [address] [phone] --Must be a resident of [facility] -- Must have NO insurance coverage whatsoever (no Medicaid/ Medicare, etc.) -- The pt. MUST have a primary care doctor that directs their care regularly and follows them in the community   MedAssist  [phone]   [employer]  [phone]    Agencies that provide inexpensive medical care: Organization         Address  Phone   Notes  [facility]  [phone]   [facility]    [phone]   [facility] [address]  [place], [place] [address] [phone]   [facility] [address] [place], [place] [phone]   Planned Parenthood    [phone]   [facility]    [phone]   [facility]  [address], [place] Phone:  [phone], Fax:  [phone] Hours of Operation:  9 am - 6 pm, M-F.  Also accepts Medicaid/Medicare and self-pay.  [facility]  [address], [place] Phone: [phone], Fax: [phone]. Hours of Operation:  8:30 am - 5:30 pm, M-F.  Also accepts Medicaid and self-pay.  [facility] [place] [address] [place] Phone: [phone]   [facility] [address] Natasha Bence [place], [place] [phone], Ext. [phone] Mondays & Thursdays: 7-9 AM.  First 15 patients are seen on a first come, first serve basis.    Medicaid-accepting [facility] Providers:  Organization         Address  Phone   Notes  [facility] [address], [place] [phone] Also accepts self-pay patients.  [facility] [address] Laurell Josephs [place], [place]  [phone]   [facility] [address], [place] [phone]   [facility] [address], [place] [phone]   Renaye Rakers [address], [place]   [phone] Only accepts [place] patients after they have their name applied to their card.   Self-Pay (no insurance) in [facility]:  Organization         Address  Phone   Notes  Sickle Cell Patients, [facility] [address] [place], [place] [phone]   [facility] [address] [place], [place] [phone]   [facility] [place]  [address], [place] [phone]   [facility]/Dr. Osei-Bonsu   [address], [place] or [address], [place] [phone] Phone number for both [place] and [place] locations is the same.  [facility] [address] [place] [phone]   [facility] [address] [place] or [address] [phone]   [facility] [address] [place] [phone], phone; [phone], fax Sees patients 1st and 3rd Saturday of every month.  Must not qualify for public or private insurance (i.e. Medicaid, Medicare, [place]omi [place]ron [place] Health Choice, Veterans' Benefits)  Household income should be no more than 200% of the poverty level The clinic cannot treat you if you are pregnant or think you are pregnant  Sexually transmitted diseases are not treated at the clinic.    Dental Care: Organization         Address  Phone  Notes  [facility] Department of [facility] [address] [place], [place] [phone] Accepts children up to age 59 who are enrolled in [place] or [place] Health Choice; pregnant women with a Medicaid card; and children who have applied for Medicaid or [place] Health Choice, but were declined, whose parents can pay a reduced fee at time of service.  [facility]  [address], [place] [phone] Accepts children up to age 51 who are enrolled in [place] or [place] Health Choice; pregnant women with a Medicaid card; and children who have applied for Medicaid or [place] Health Choice, but were declined, whose parents can pay a reduced fee at time of service.  [facility]  [address] [place], [place] [phone] Patients are seen by appointment only. Walk-ins are not accepted. [facility] will see patients 61 years of age and older. Monday - Tuesday (8am-5pm) Most Wednesdays (8:30-5pm) $30 per visit, cash only  [employer] [facility] [employer]  [address], [place] [phone] Patients are seen by appointment only. Walk-ins are not accepted. [facility] will see patients 37 years of age and older. One Wednesday Evening (Monthly: Volunteer Based).  $30 per visit, cash only  [employer]  [phone] for adults; Children under age 20, call Graduate Pediatric Dentistry at [phone]. Children aged 75-14, please call [phone] to request a pediatric application.  Dental services are provided in all areas of dental care including fillings, crowns and bridges, complete and partial dentures, implants, gum treatment, root canals, and extractions. Preventive care is also provided. Treatment is provided to both adults and children. Patients are selected via a lottery and there is often a waiting list.   [facility] [address] [place]  [phone] [email or web address]   [facility] [address] [place], [place] [phone], Ext. [phone] Second and Fourth Thursday of each month, opens at 6:30 AM; Clinic ends at 9 AM.  Patients are seen on a first-come first-served basis, and a limited number are seen during each clinic.   [facility]  [address] Ether Griffins [place], [place] [phone]   Eligibility Requirements You must have lived in [place], [place], or [place] counties for at least the last three months.   You cannot be eligible for state or federal sponsored [place], including [employer], [place], or [employer].   You generally cannot be eligible for healthcare insurance through your employer.    How to apply: Eligibility screenings are held every Tuesday and Wednesday afternoon from 1:00 pm until 4:00 pm. You do not need an appointment for the interview!  [facility] [address], [place], [place] [phone]   [facility]  [phone]   [facility] Health Department   [phone]   [facility] Department  [phone]    Behavioral Health Resources in the Community: Intensive Outpatient Programs Organization         Address  Phone  Notes  [facility] [address], [place], [place] [phone]   [facility] [address], [place], [place] [phone]   [facility] [address], [place], [place]  [phone]   [facility] Mental Health [address]  [place], [place] [phone] or [phone]   Substance Abuse Resources Organization         Address  Phone  Notes  [facility] Drug Services  [phone]   [facility]  [phone]   [facility] [place]  [phone]   Floydene Flock  [phone]   Residential & Outpatient Substance Abuse Program  [phone]   Psychological Services Organization         Address  Phone  Notes  [facility] Behavioral Health  [phone]   [facility]  [phone]   [facility] Mental Health [address], [place] [phone] or [phone]    Mobile Crisis Teams Organization         Address  Phone  Notes  [facility], Mobile Crisis Care Unit  [phone]   [facility]  [address] [place], [place] [phone]   Doristine Locks [address] [place] [phone]    Self-Help/Support Groups Organization         Address  Phone             Notes  [facility] [place] -  variety of support groups  [phone]- [phone] Call for more information  Narcotics Anonymous (NA), [facility] [address], [place]  2 meetings at this location   Residential Sports administratorTreatment Programs Organization         Address  Phone  Notes  [facility] [address],    [place]  [phone]   [facility]  [address], [place] [phone], [place], [place] [phone]   [facility] [address], [place] [phone] Admissions: 8am-3pm M-F  [facility] [address]    [place], [place] [phone]   [facility] [address], [place], [place] [phone]   [facility] [address]  [place], [place] [phone]   [facility] - Intensive Outpatient [address], [place], [place] [phone]   [facility] ([facility] [address]  [place], [place] [phone] or [phone]   [facility] ([facility]) [address] [place], [place] [phone] Accepts Medicaid  [facility] [address]  [place] [phone] Substance Abuse/Addiction Treatment   [facility] Behavioral Health Resources Organization         Address  Phone  Notes  [facility]  [phone]   Angie FavaJulie Brannon, PhD [address] [place], [place]   [phone](336) [phone] or [phone]   [facility]   [address] [place], [place] [phone](336) [phone]   [facility] [address], [place], [place] [phone](336) [phone] Insurance/Medicaid/sponsorship through [facility]  [facility] [address]                                    [place], [place] [phone](336) [phone] Therapy/tele-psych/case  [facility] [address].    [place], [place] [phone]) [phone]    Dr. Lolly MustacheArfeen  [phone](336) [phone]   [facility]  [employer] [facility] 1) [address], [place] 2) [address], [place] 3)  [address], [place] [phone]) [phone]  [phone](336) [phone]   [facility] Child Abuse Hotline [phone]) [phone] or [phone](336) [phone] (After Hours)
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
## 2014-02-22 NOTE — ED Notes (Signed)
Declined W/C at D/C and was escorted to lobby by RN. 

## 2014-02-22 NOTE — ED Notes (Signed)
Pt. Stated, I woke up this morning and made one little turn and it hurt my neck and I twisted my rt. Ankle.
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
## 2014-02-23 NOTE — ED Provider Notes (Signed)
Medical screening examination/treatment/procedure(s) were performed by non-physician practitioner and as supervising physician I was immediately available for consultation/collaboration.   EKG Interpretation None        Jobani Sabado, MD 02/23/14 0007 

## 2014-05-20 ENCOUNTER — Encounter (HOSPITAL_COMMUNITY): Payer: Self-pay | Admitting: Emergency Medicine

## 2014-05-20 ENCOUNTER — Emergency Department (HOSPITAL_COMMUNITY)
Admission: EM | Admit: 2014-05-20 | Discharge: 2014-05-20 | Disposition: A | Discharge status: Home / Self Care | Payer: Medicaid Other | Attending: Emergency Medicine | Admitting: Emergency Medicine

## 2014-05-20 DIAGNOSIS — M542 Cervicalgia: Secondary | ICD-10-CM | POA: Insufficient documentation

## 2014-05-20 DIAGNOSIS — Z720 Tobacco use: Secondary | ICD-10-CM | POA: Insufficient documentation

## 2014-05-20 DIAGNOSIS — F319 Bipolar disorder, unspecified: Secondary | ICD-10-CM | POA: Diagnosis not present

## 2014-05-20 DIAGNOSIS — Z8709 Personal history of other diseases of the respiratory system: Secondary | ICD-10-CM | POA: Insufficient documentation

## 2014-05-20 DIAGNOSIS — I10 Essential (primary) hypertension: Secondary | ICD-10-CM | POA: Diagnosis not present

## 2014-05-20 DIAGNOSIS — R252 Cramp and spasm: Secondary | ICD-10-CM | POA: Diagnosis not present

## 2014-05-20 DIAGNOSIS — Z79899 Other long term (current) drug therapy: Secondary | ICD-10-CM | POA: Insufficient documentation

## 2014-05-20 DIAGNOSIS — L299 Pruritus, unspecified: Secondary | ICD-10-CM | POA: Diagnosis not present

## 2014-05-20 DIAGNOSIS — H9191 Unspecified hearing loss, right ear: Secondary | ICD-10-CM | POA: Insufficient documentation

## 2014-05-20 MED ORDER — PERMETHRIN 5 % EX CREA: TOPICAL_CREAM | CUTANEOUS | Status: DC

## 2014-05-20 MED ORDER — DIAZEPAM 5 MG PO TABS: 5.0000 mg | ORAL_TABLET | Freq: Two times a day (BID) | ORAL | Status: DC

## 2014-05-20 MED ORDER — HYDROCODONE-ACETAMINOPHEN 5-325 MG PO TABS
2.0000 | ORAL_TABLET | Freq: Once | ORAL | Status: AC
Start: 2014-05-20 — End: 2014-05-20
  Administered 2014-05-20: 2 via ORAL
  Filled 2014-05-20: qty 2

## 2014-05-20 MED ORDER — HYDROXYZINE HCL 25 MG PO TABS: 25.0000 mg | ORAL_TABLET | Freq: Four times a day (QID) | ORAL | Status: DC | PRN

## 2014-05-20 MED ORDER — DIAZEPAM 5 MG PO TABS
5.0000 mg | ORAL_TABLET | Freq: Once | ORAL | Status: AC
  Administered 2014-05-20: 5 mg via ORAL
  Filled 2014-05-20: qty 1

## 2014-05-20 MED ORDER — HYDROCODONE-ACETAMINOPHEN 5-325 MG PO TABS: 1.0000 | ORAL_TABLET | ORAL | Status: DC | PRN

## 2014-05-20 NOTE — ED Notes (Signed)
Pt is on tears on her room states she is on to much pain, refuses to get dress on a hospital gown and have us taking vital signs on her she "is upset of waiting to long on pain".

## 2014-05-20 NOTE — ED Notes (Signed)
Unable to get vitals signs.

## 2014-05-20 NOTE — ED Notes (Signed)
Pt sitting in chair, tearful, stomping feet on floor.  St's she has been having neck pain since this am.  When ask to sit on stretcher pt refuses st's she can't due to back pain.  Pt also c/o itching for several days
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
## 2014-05-20 NOTE — ED Notes (Signed)
Pt still refusing to have VS done.

## 2014-05-20 NOTE — ED Provider Notes (Signed)
CSN: [id number]     Arrival date & time 05/20/14  1443 History   First MD Initiated Contact with Patient 05/20/14 1847     Chief Complaint  Patient presents with  . Neck Pain  . Headache     (Consider location/radiation/quality/duration/timing/severity/associated sxs/prior Treatment) Patient is a 49 y.o. female presenting with neck pain and headaches.  Neck Pain Pain location:  R side Quality:  Cramping ("like contractions") Pain radiates to:  Does not radiate Pain severity:  Severe Onset quality:  Gradual Duration:  1 day Timing:  Constant Progression:  Unchanged Chronicity:  New Context: not fall, not jumping from heights and not recent injury   Relieved by:  Nothing Worsened by:  Bending, twisting and position Ineffective treatments:  Heat and ice Associated symptoms: headaches (throbbing, present during day, start slowly, diffuse)   Associated symptoms: no chest pain, no fever, no numbness and no weakness   Risk factors: no hx of head and neck radiation, no recent epidural and no recurrent falls   Headache Associated symptoms: congestion, cough and neck pain (months)   Associated symptoms: no abdominal pain, no back pain, no fever, no nausea, no numbness, no sore throat and no vomiting     Past Medical History  Diagnosis Date  . Bipolar 1 disorder   . Claustrophobia   . Bronchitis   . Hypertension   . Back pain   . Hearing loss in right ear    Past Surgical History  Procedure Laterality Date  . Cholecystectomy    . Ligament repair      left arm   Family History  Problem Relation Age of Onset  . Cancer Mother   . Hypertension Father   . Diabetes Other    History  Substance Use Topics  . Smoking status: Current Every Day Smoker -- 1.00 packs/day    Types: Cigarettes  . Smokeless tobacco: Never Used  . Alcohol Use: No     Comment: states stopped drinking as New Years Resolution   OB History    No data available     Review of Systems   Constitutional: Positive for appetite change. Negative for fever.  HENT: Positive for congestion. Negative for sore throat.   Eyes: Negative for visual disturbance.  Respiratory: Positive for cough. Negative for shortness of breath.   Cardiovascular: Negative for chest pain.  Gastrointestinal: Negative for nausea, vomiting and abdominal pain.  Genitourinary: Negative for difficulty urinating.  Musculoskeletal: Positive for neck pain (months). Negative for back pain.  Skin: Negative for rash.  Neurological: Positive for headaches (throbbing, present during day, start slowly, diffuse). Negative for syncope, weakness and numbness.      Allergies  Haloperidol decanoate; Ibuprofen; Flexeril; Naproxen; and Tramadol  Home Medications   Prior to Admission medications   Medication Sig Start Date End Date Taking? Authorizing Provider  albuterol (PROVENTIL HFA;VENTOLIN HFA) 108 (90 BASE) MCG/ACT inhaler Inhale 2 puffs into the lungs every 6 (six) hours as needed for wheezing or shortness of breath.   Yes Historical Provider, MD  carbamazepine (EQUETRO) 200 MG CP12 12 hr capsule Take 200 mg by mouth at bedtime.    Yes Historical Provider, MD  cyclobenzaprine (FLEXERIL) 10 MG tablet Take 1 tablet (10 mg total) by mouth 3 (three) times daily as needed for muscle spasms. 12/09/13  Yes Tommie Sams, DO  losartan (COZAAR) 25 MG tablet Take 25 mg by mouth daily.   Yes Historical Provider, MD  diazepam (VALIUM) 5 MG tablet Take 1  tablet (5 mg total) by mouth 2 (two) times daily. 05/20/14   Alvira Monday, MD  HYDROcodone-acetaminophen (NORCO/VICODIN) 5-325 MG per tablet Take 1 tablet by mouth every 4 (four) hours as needed for moderate pain or severe pain. 05/20/14   Alvira Monday, MD  hydrOXYzine (ATARAX/VISTARIL) 25 MG tablet Take 1 tablet (25 mg total) by mouth every 6 (six) hours as needed for itching. 05/20/14   Alvira Monday, MD  oxyCODONE-acetaminophen (PERCOCET/ROXICET) 5-325 MG per tablet Take 1  tablet by mouth every 8 (eight) hours as needed for severe pain. 12/09/13   Tommie Sams, DO  permethrin (ELIMITE) 5 % cream Apply to affected area once from neck down. Leave on for 8 hours then wash off.  Repeat in 1 week. 05/20/14   Alvira Monday, MD   BP 132/71 mmHg  Pulse 79  Temp(Src) 98 F (36.7 C) (Oral)  Resp 18  SpO2 99% Physical Exam  Constitutional: She is oriented to person, place, and time. She appears well-developed and well-nourished. No distress.  HENT:  Head: Normocephalic and atraumatic.  Mouth/Throat: No oropharyngeal exudate.  Eyes: Conjunctivae and EOM are normal. Pupils are equal, round, and reactive to light.  Neck: Normal range of motion. Muscular tenderness (right side) present. No spinous process tenderness present.  Cardiovascular: Normal rate, regular rhythm, normal heart sounds and intact distal pulses.  Exam reveals no gallop and no friction rub.   No murmur heard. Pulmonary/Chest: Effort normal and breath sounds normal. No stridor. No respiratory distress. She has no wheezes. She has no rales.  Abdominal: Soft. She exhibits no distension. There is no tenderness. There is no guarding.  Musculoskeletal: She exhibits no edema or tenderness.  Neurological: She is alert and oriented to person, place, and time. She has normal strength. She displays no tremor. No cranial nerve deficit or sensory deficit. Coordination and gait normal. GCS eye subscore is 4. GCS verbal subscore is 5. GCS motor subscore is 6.  Skin: Skin is warm and dry. No rash noted. She is not diaphoretic. No erythema.  Nursing note and vitals reviewed.   ED Course  Procedures (including critical care time) Labs Review Labs Reviewed - No data to display  Imaging Review No results found.   EKG Interpretation None      MDM   Final diagnoses:  Cervical pain (neck)  Muscle cramp  Itching   23 her old female with a history of bipolar, hypertension presents with concern of diffuse  pruritus and right-sided neck pain. Patient reports multiple episodes of neck pain over time however this episode began today. Exam is significant for a significant right trapezius spasm.  She does not have any hx of trauma, numbness, weakness, loss of bowel or bladder control, and have low suspicion for emergent intraspinal pathology.  Patient reports headaches over the last week, and given their present during the day, began slowly, and do not have neurologic deficits have low suspicion for subarachnoid hemorrhage or meningitis.    Patient also describes pruritis. No rashes present other than areas that have artery been excoriated and is difficult to identify the cause of her arthritis. She reports that she's been sleeping on the floor in a new building with her family, and that her family members also have a pleuritic rash.  Given multiple members of the family with rash likely represents an into infestation of some kind. We'll treat patient empirically for scabies with permethrin given significant pruritis, however do not see evidence of scabies on exam. Pt  should evaluate for bed bugs, fleas etc.  Patient was given Valium in the emergency department. She has several allergies and was written a prescription for Valium, small prescription for Norco, and a prescription for Atarax for pruritus.  She was discharged in stable condition with understanding of reasons to return. She was provided resource guide and number call [facility] to establish a primary care physician  Alvira MondayErin Amedeo Detweiler, MD 05/21/14 0330  Dione Boozeavid Glick, MD 05/21/14 1348

## 2014-05-20 NOTE — ED Notes (Signed)
Pt c/o neck pain and HA starting today; pt sts generalized itching x weeks

## 2014-05-20 NOTE — Discharge Instructions (Signed)
°Emergency Department Resource Guide °1) Find a Doctor and Pay Out of Pocket °Although you won't have to find out who is covered by your insurance plan, it is a good idea to ask around and get recommendations. You will then need to call the office and see if the doctor you have chosen will accept you as a new patient and what types of options they offer for patients who are self-pay. Some doctors offer discounts or will set up payment plans for their patients who do not have insurance, but you will need to ask so you aren't surprised when you get to your appointment. ° °2) Contact Your Local Health Department °Not all health departments have doctors that can see patients for sick visits, but many do, so it is worth a call to see if yours does. If you don't know where your local health department is, you can check in your phone book. The CDC also has a tool to help you locate your state's health department, and many state websites also have listings of all of their local health departments. ° °3) Find a Walk-in Clinic °If your illness is not likely to be very severe or complicated, you may want to try a walk in clinic. These are popping up all over the country in pharmacies, drugstores, and shopping centers. They're usually staffed by nurse practitioners or physician assistants that have been trained to treat common illnesses and complaints. They're usually fairly quick and inexpensive. However, if you have serious medical issues or chronic medical problems, these are probably not your best option. ° °No Primary Care Doctor: °- Call [facility] at  832-8000 - they can help you locate a primary care doctor that  accepts your insurance, provides certain services, etc. °- Physician Referral Service- 1-800-533-3463 ° °Chronic Pain Problems: °Organization         Address  Phone   Notes  °[facility]  ([phone]) [phone] Patients need to be referred by their primary care doctor.  ° °Medication  Assistance: °Organization         Address  Phone   Notes  °[place] [facility] [address] °[place], [place] [address] (336) 641-8030 --Must be a resident of [place] °-- Must have NO insurance coverage whatsoever (no Medicaid/ Medicare, etc.) °-- The pt. MUST have a primary care doctor that directs their care regularly and follows them in the community °  °[facility]  (866) 331-1348   °[employer]  (888) 892-1162   ° °Agencies that provide inexpensive medical care: °Organization         Address  Phone   Notes  °[facility]  (336) 832-8035   °[facility]    (336) 832-7272   °[facility] [address] °[place], [place] [address] (336) 832-4777   °[facility] [address], °[place] (336) 271-4999   °[facility]    (336) 373-0678   °[facility]    (336) 272-1050   °[facility] ° [address], [place] Phone:  (336) 832-4444, Fax:  (336) 832-4440 Hours of Operation:  9 am - 6 pm, M-F.  Also accepts Medicaid/Medicare and self-pay.  °[facility] ° [address], [place] Phone: (336) [phone], Fax: (336) [phone]. Hours of Operation:  8:30 am - 5:30 pm, M-F.  Also accepts Medicaid and self-pay.  °[facility] [place] [address], [place] Phone: (336) 878-6027   °[facility] [address], [place], [place] (336)723-1848, Ext. 123 Mondays & Thursdays: 7-9 AM.  First 15 patients are seen on a first come, first serve basis. °  ° °Medicaid-accepting [place] Providers: ° °Organization         Address  Phone   Notes  °[facility] [address], [place] (336) [phone] Also accepts self-pay patients.  °[facility] [address], [place] ° (336) 856-9996   °[facility] [address], [place]  (336) 288-8857   °[facility] [address] [place] [address], [place] (336) 299-7000   °Veita Bland [address], [place]  ° (336) 373-1557 Only accepts [place] Access Medicaid patients after they have their name applied to their card.  ° °Self-Pay (no insurance) in [place]: ° °Organization         Address  Phone   Notes  °Sickle Cell Patients, [facility] [address], [place] (336) 832-1970   °[facility] [address], [place] (336) 832-4400   °[facility] ° [address], [place] (336) 992-4800   °[facility]/Dr. Osei-Bonsu ° [address] [place] [address], [place] or [address], [place] (336) [phone] Phone number for both [place] and [place] locations is the same.  °[facility] [address], [place] (336) 299-0000   °[facility] [address] [place] [address], [place] or [address] (336) 852-7530 °(336) 878-2260   °[facility] [address], [place] (336) 350-1642, phone; (336) 294-5005, fax Sees patients 1st and 3rd Saturday of every month.  Must not qualify for public or private insurance (i.e. Medicaid, Medicare, [place] Health Choice, Veterans' Benefits) • Household income should be no more than 200% of the poverty level •The clinic cannot treat you if you are pregnant or think you are pregnant • Sexually transmitted diseases are not treated at the clinic.  ° ° °Dental Care: °Organization         Address  Phone  Notes  °[place] [facility] [address], [place] (336) 641-6152 Accepts children up to age 21 who are enrolled in Medicaid or [place] Health Choice; pregnant women with a Medicaid card; and children who have applied for Medicaid or [place] Health Choice, but were declined, whose parents can pay a reduced fee at time of service.  °[place]  [facility] [place]  [address], [place] (336) 641-7733 Accepts children up to age 21 who are enrolled in Medicaid or [place] Health Choice; pregnant women with a Medicaid card; and children who have applied for Medicaid or [place] Health Choice, but were declined, whose parents can pay a reduced fee at time of service.  °[facility] ° [address], [place] (336) 641-4533 Patients are seen by appointment only. Walk-ins are not accepted. [facility] will see patients 18 years of age and older. °Monday - Tuesday (8am-5pm) °Most Wednesdays (8:30-5pm) °$30 per visit, cash only  °[facility] ° [address], [place] (336) 641-4533 Patients are seen by appointment only. Walk-ins are not accepted. [facility] will see patients 18 years of age and older. °One   Wednesday Evening (Monthly: Volunteer Based).  $30 per visit, cash only  °[facility]  (919) [phone] for adults; Children under age 4, call Graduate Pediatric Dentistry at ([phone]) [phone]. Children aged 4-14, please call ([phone]) [phone] to request a pediatric application. ° Dental services are provided in all areas of dental care including fillings, crowns and bridges, complete and partial dentures, implants, gum treatment, root canals, and extractions. Preventive care is also provided. Treatment is provided to both adults and children. °Patients are selected via a lottery and there is often a waiting list. °  °[facility] [address], °[place] ° (336) 763-8833 [email or web address] °  °[facility] [address], [place], [place] (336)723-1848, Ext. 123 Second and Fourth Thursday of each month, opens at 6:30 AM; Clinic ends at 9 AM.  Patients are seen on a first-come first-served basis, and a limited number are seen during each clinic.  ° °[facility] ° [address], [place], [place] (336) [phone]    Eligibility Requirements °You must have lived in [place], or [place] counties for at least the last three months. °  You cannot be eligible for state or federal sponsored healthcare insurance, including Veterans Administration, Medicaid, or Medicare. °  You generally cannot be eligible for healthcare insurance through your employer.  °  How to apply: °Eligibility screenings are held every Tuesday and Wednesday afternoon from 1:00 pm until 4:00 pm. You do not need an appointment for the interview!  °[facility] [address], [place], [place] 336-631-2330   °[facility]  336-342-8273   °[facility]  336-703-3100   °[facility]  336-570-6415   ° °Behavioral Health Resources in the Community: °Intensive Outpatient Programs °Organization         Address  Phone  Notes  °[facility] [address], [place], [place] 336-878-6098   °[facility] [address], [place], [place] 336-832-9800   °[facility] [address], [place], [place] ° 336-882-2125   °[facility] [address],  °[place], [place] 1-800-853-5163 or 336-641-4981   °Substance Abuse Resources °Organization         Address  Phone  Notes  °[facility]  336-882-2125   °[facility]  336-784-9470   °[facility]  336-285-9073   °[facility]  336-845-3988   °Residential & Outpatient Substance Abuse Program  1-800-659-3381   °Psychological Services °Organization         Address  Phone  Notes  °[facility]  336- 832-9600   °[facility]  336- 378-7881   °[facility] [address], [place] 1-800-853-5163 or 336-641-4981   ° °Mobile Crisis Teams °Organization         Address  Phone  Notes  °[facility], Mobile Crisis Care Unit  1-877-626-1772   °Assertive °[facility] ° [address]  [place], [place] 336-834-9664   °Sharon DeEsch [address] °[place] 336-554-5454   ° °Self-Help/Support Groups °Organization         Address  Phone             Notes  °[facility]  - variety of support groups  336- 373-1402 Call for more information  °Narcotics Anonymous (NA), [facility] [address], °[place]  2 meetings at this location  ° °  Residential Treatment Programs Organization         Address  Phone  Notes  [facility] [address]    [place]  5-621-308-6578   [facility]  [address] [place] [address], [place], [place] [phone]   [facility] [address] [place], [place] [phone] Admissions: 8am-3pm M-F  [facility] [address]    [place], [place] 403-474-2595   [facility] [address] [place], [place], [place] [phone]   [facility] [address],  [place], [place] [phone]   [facility] - Intensive Outpatient [address], [place], [place] 630-160-1093   [facility] ([facility] [address] [place]  [place], [place] 2-355-732-2025 or (501)687-5354   [facility] ([facility] [address], [place], [place] 831-517-6160 Accepts Medicaid  [facility] [address],  [place] 7-371-062-6948 Substance Abuse/Addiction Treatment   [facility]         Address  Phone  Notes  [facility]  228-149-9480   Angie Fava, PhD [address] [place], [place]   670-603-6741 or (561)701-9352   [facility]   [address] [place], [place] 646-765-1093   [facility] [address] [place], [place] [phone] Insurance/Medicaid/sponsorship through [facility] [address]                                    [place], [place] (229) [phone] Therapy/tele-psych/case    [facility] [address], [place] [phone]    Dr. Lolly Mustache  [phone]   [facility]  [employer] [facility] 1) [address], [place] 2) [address] [place] 3)  [address], [place] [phone] 623-888-3210  [phone]   [facility] Child Abuse Hotline 252 673 9773 or (315)293-0157 (After Hours)        Cervical Strain and Sprain (Whiplash) with Rehab Cervical strain and sprain are injuries that commonly occur with "whiplash" injuries. Whiplash occurs when the neck is forcefully whipped backward or forward, such as during a motor vehicle accident or during contact sports. The muscles, ligaments, tendons, discs, and nerves of the neck are susceptible to injury when this occurs. RISK FACTORS Risk of having a whiplash injury increases if:  Osteoarthritis of the spine.  Situations that make head or neck accidents or trauma more likely.  High-risk sports (football, rugby, wrestling, hockey, auto racing, gymnastics, diving, contact karate, or boxing).  Poor strength and flexibility of the neck.  Previous neck injury.  Poor tackling technique.  Improperly fitted or padded equipment. SYMPTOMS   Pain or stiffness in the front or back of neck or both.  Symptoms may present immediately or up to 24 hours after injury.  Dizziness, headache, nausea, and vomiting.  Muscle spasm with soreness and stiffness in the neck.  Tenderness and swelling at the injury site. PREVENTION  Learn and use proper technique (avoid tackling with the head, spearing, and head-butting; use proper falling techniques to avoid landing on the head).  Warm up and stretch properly before activity.  Maintain physical fitness:  Strength, flexibility, and endurance.  Cardiovascular fitness.  Wear properly fitted and padded protective equipment, such as padded soft  collars, for participation in contact  sports. PROGNOSIS  Recovery from cervical strain and sprain injuries is dependent on the extent of the injury. These injuries are usually curable in 1 week to 3 months with appropriate treatment.  RELATED COMPLICATIONS   Temporary numbness and weakness may occur if the nerve roots are damaged, and this may persist until the nerve has completely healed.  Chronic pain due to frequent recurrence of symptoms.  Prolonged healing, especially if activity is resumed too soon (before complete recovery). TREATMENT  Treatment initially involves the use of ice and medication to help reduce pain and inflammation. It is also important to perform strengthening and stretching exercises and modify activities that worsen symptoms so the injury does not get worse. These exercises may be performed at home or with a therapist. For patients who experience severe symptoms, a soft, padded collar may be recommended to be worn around the neck.  Improving your posture may help reduce symptoms. Posture improvement includes pulling your chin and abdomen in while sitting or standing. If you are sitting, sit in a firm chair with your buttocks against the back of the chair. While sleeping, try replacing your pillow with a small towel rolled to 2 inches in diameter, or use a cervical pillow or soft cervical collar. Poor sleeping positions delay healing.  For patients with nerve root damage, which causes numbness or weakness, the use of a cervical traction apparatus may be recommended. Surgery is rarely necessary for these injuries. However, cervical strain and sprains that are present at birth (congenital) may require surgery. MEDICATION   If pain medication is necessary, nonsteroidal anti-inflammatory medications, such as aspirin and ibuprofen, or other minor pain relievers, such as acetaminophen, are often recommended.  Do not take pain medication for 7 days before surgery.  Prescription pain relievers may be given if deemed  necessary by your caregiver. Use only as directed and only as much as you need. HEAT AND COLD:   Cold treatment (icing) relieves pain and reduces inflammation. Cold treatment should be applied for 10 to 15 minutes every 2 to 3 hours for inflammation and pain and immediately after any activity that aggravates your symptoms. Use ice packs or an ice massage.  Heat treatment may be used prior to performing the stretching and strengthening activities prescribed by your caregiver, physical therapist, or athletic trainer. Use a heat pack or a warm soak. SEEK MEDICAL CARE IF:   Symptoms get worse or do not improve in 2 weeks despite treatment.  New, unexplained symptoms develop (drugs used in treatment may produce side effects). EXERCISES RANGE OF MOTION (ROM) AND STRETCHING EXERCISES - Cervical Strain and Sprain These exercises may help you when beginning to rehabilitate your injury. In order to successfully resolve your symptoms, you must improve your posture. These exercises are designed to help reduce the forward-head and rounded-shoulder posture which contributes to this condition. Your symptoms may resolve with or without further involvement from your physician, physical therapist or athletic trainer. While completing these exercises, remember:   Restoring tissue flexibility helps normal motion to return to the joints. This allows healthier, less painful movement and activity.  An effective stretch should be held for at least 20 seconds, although you may need to begin with shorter hold times for comfort.  A stretch should never be painful. You should only feel a gentle lengthening or release in the stretched tissue. STRETCH- Axial Extensors  Lie on your back on the floor. You may bend your knees for comfort.  Place a rolled-up hand towel or dish towel, about 2 inches in diameter, under the part of your head that makes contact with the floor.  Gently tuck your chin, as if trying to make a "double  chin," until you feel a gentle stretch at the base of your head.  Hold __________ seconds. Repeat __________ times. Complete this exercise __________ times per day.  STRETCH - Axial Extension   Stand or sit on a firm surface. Assume a good posture: chest up, shoulders drawn back, abdominal muscles slightly tense, knees unlocked (if standing) and feet hip width apart.  Slowly retract your chin so your head slides back and your chin slightly lowers. Continue to look straight ahead.  You should feel a gentle stretch in the back of your head. Be certain not to feel an aggressive stretch since this can cause headaches later.  Hold for __________ seconds. Repeat __________ times. Complete this exercise __________ times per day. STRETCH - Cervical Side Bend   Stand or sit on a firm surface. Assume a good posture: chest up, shoulders drawn back, abdominal muscles slightly tense, knees unlocked (if standing) and feet hip width apart.  Without letting your nose or shoulders move, slowly tip your right / left ear to your shoulder until your feel a gentle stretch in the muscles on the opposite side of your neck.  Hold __________ seconds. Repeat __________ times. Complete this exercise __________ times per day. STRETCH - Cervical Rotators   Stand or sit on a firm surface. Assume a good posture: chest up, shoulders drawn back, abdominal muscles slightly tense, knees unlocked (if standing) and feet hip width apart.  Keeping your eyes level with the ground, slowly turn your head until you feel a gentle stretch along the back and opposite side of your neck.  Hold __________ seconds. Repeat __________ times. Complete this exercise __________ times per day. RANGE OF MOTION - Neck Circles   Stand or sit on a firm surface. Assume a good posture: chest up, shoulders drawn back, abdominal muscles slightly tense, knees unlocked (if standing) and feet hip width apart.  Gently roll your head down and around  from the back of one shoulder to the back of the other. The motion should never be forced or painful.  Repeat the motion 10-20 times, or until you feel the neck muscles relax and loosen. Repeat __________ times. Complete the exercise __________ times per day. STRENGTHENING EXERCISES - Cervical Strain and Sprain These exercises may help you when beginning to rehabilitate your injury. They may resolve your symptoms with or without further involvement from your physician, physical therapist, or athletic trainer. While completing these exercises, remember:   Muscles can gain both the endurance and the strength needed for everyday activities through controlled exercises.  Complete these exercises as instructed by your physician, physical therapist, or athletic trainer. Progress the resistance and repetitions only as guided.  You may experience muscle soreness or fatigue, but the pain or discomfort you are trying to eliminate should never worsen during these exercises. If this pain does worsen, stop and make certain you are following the directions exactly. If the pain is still present after adjustments, discontinue the exercise until you can discuss the trouble with your clinician. STRENGTH - Cervical Flexors, Isometric  Face a wall, standing about 6 inches away. Place a small pillow, a ball about 6-8 inches in diameter, or a folded towel between your forehead and the wall.  Slightly tuck your chin and gently push your forehead  into the soft object. Push only with mild to moderate intensity, building up tension gradually. Keep your jaw and forehead relaxed.  Hold 10 to 20 seconds. Keep your breathing relaxed.  Release the tension slowly. Relax your neck muscles completely before you start the next repetition. Repeat __________ times. Complete this exercise __________ times per day. STRENGTH- Cervical Lateral Flexors, Isometric   Stand about 6 inches away from a wall. Place a small pillow, a ball  about 6-8 inches in diameter, or a folded towel between the side of your head and the wall.  Slightly tuck your chin and gently tilt your head into the soft object. Push only with mild to moderate intensity, building up tension gradually. Keep your jaw and forehead relaxed.  Hold 10 to 20 seconds. Keep your breathing relaxed.  Release the tension slowly. Relax your neck muscles completely before you start the next repetition. Repeat __________ times. Complete this exercise __________ times per day. STRENGTH - Cervical Extensors, Isometric   Stand about 6 inches away from a wall. Place a small pillow, a ball about 6-8 inches in diameter, or a folded towel between the back of your head and the wall.  Slightly tuck your chin and gently tilt your head back into the soft object. Push only with mild to moderate intensity, building up tension gradually. Keep your jaw and forehead relaxed.  Hold 10 to 20 seconds. Keep your breathing relaxed.  Release the tension slowly. Relax your neck muscles completely before you start the next repetition. Repeat __________ times. Complete this exercise __________ times per day. POSTURE AND BODY MECHANICS CONSIDERATIONS - Cervical Strain and Sprain Keeping correct posture when sitting, standing or completing your activities will reduce the stress put on different body tissues, allowing injured tissues a chance to heal and limiting painful experiences. The following are general guidelines for improved posture. Your physician or physical therapist will provide you with any instructions specific to your needs. While reading these guidelines, remember:  The exercises prescribed by your provider will help you have the flexibility and strength to maintain correct postures.  The correct posture provides the optimal environment for your joints to work. All of your joints have less wear and tear when properly supported by a spine with good posture. This means you will  experience a healthier, less painful body.  Correct posture must be practiced with all of your activities, especially prolonged sitting and standing. Correct posture is as important when doing repetitive low-stress activities (typing) as it is when doing a single heavy-load activity (lifting). PROLONGED STANDING WHILE SLIGHTLY LEANING FORWARD When completing a task that requires you to lean forward while standing in one place for a long time, place either foot up on a stationary 2- to 4-inch high object to help maintain the best posture. When both feet are on the ground, the low back tends to lose its slight inward curve. If this curve flattens (or becomes too large), then the back and your other joints will experience too much stress, fatigue more quickly, and can cause pain.  RESTING POSITIONS Consider which positions are most painful for you when choosing a resting position. If you have pain with flexion-based activities (sitting, bending, stooping, squatting), choose a position that allows you to rest in a less flexed posture. You would want to avoid curling into a fetal position on your side. If your pain worsens with extension-based activities (prolonged standing, working overhead), avoid resting in an extended position such as sleeping on your stomach.  Most people will find more comfort when they rest with their spine in a more neutral position, neither too rounded nor too arched. Lying on a non-sagging bed on your side with a pillow between your knees, or on your back with a pillow under your knees will often provide some relief. Keep in mind, being in any one position for a prolonged period of time, no matter how correct your posture, can still lead to stiffness. WALKING Walk with an upright posture. Your ears, shoulders, and hips should all line up. OFFICE WORK When working at a desk, create an environment that supports good, upright posture. Without extra support, muscles fatigue and lead to  excessive strain on joints and other tissues. CHAIR:  A chair should be able to slide under your desk when your back makes contact with the back of the chair. This allows you to work closely.  The chair's height should allow your eyes to be level with the upper part of your monitor and your hands to be slightly lower than your elbows.  Body position:  Your feet should make contact with the floor. If this is not possible, use a foot rest.  Keep your ears over your shoulders. This will reduce stress on your neck and low back. Document Released: 04/24/2005 Document Revised: 09/08/2013 Document Reviewed: 08/06/2008 [facility] Patient Information 2015 [place], [place]. This information is not intended to replace advice given to you by your health care provider. Make sure you discuss any questions you have with your health care provider.

## 2014-05-22 ENCOUNTER — Telehealth: Payer: Self-pay | Admitting: *Deleted

## 2014-05-22 NOTE — Telephone Encounter (Signed)
Pt presented torn prescriptions.  NCM verified 4 prescriptions written with Pharmacist.

## 2014-06-12 ENCOUNTER — Encounter (HOSPITAL_COMMUNITY): Payer: Self-pay | Admitting: Emergency Medicine

## 2014-06-12 ENCOUNTER — Emergency Department (HOSPITAL_COMMUNITY)
Admission: EM | Admit: 2014-06-12 | Discharge: 2014-06-12 | Disposition: A | Discharge status: Home / Self Care | Payer: Medicaid Other | Source: Home / Self Care | Attending: Family Medicine | Admitting: Family Medicine

## 2014-06-12 DIAGNOSIS — G4489 Other headache syndrome: Secondary | ICD-10-CM

## 2014-06-12 DIAGNOSIS — K088 Other specified disorders of teeth and supporting structures: Secondary | ICD-10-CM

## 2014-06-12 DIAGNOSIS — K0889 Other specified disorders of teeth and supporting structures: Secondary | ICD-10-CM

## 2014-06-12 DIAGNOSIS — J329 Chronic sinusitis, unspecified: Secondary | ICD-10-CM

## 2014-06-12 MED ORDER — AMOXICILLIN 875 MG PO TABS: 875.0000 mg | ORAL_TABLET | Freq: Two times a day (BID) | ORAL | Status: DC

## 2014-06-12 MED ORDER — HYDROXYZINE HCL 50 MG PO TABS: 50.0000 mg | ORAL_TABLET | Freq: Four times a day (QID) | ORAL | Status: DC | PRN

## 2014-06-12 MED ORDER — PREDNISONE 10 MG PO TABS: ORAL_TABLET | ORAL | Status: DC

## 2014-06-12 NOTE — ED Provider Notes (Signed)
CSN: [id number]     Arrival date & time 06/12/14  0908 History   First MD Initiated Contact with Patient 06/12/14 [phone]     Chief Complaint  Patient presents with  . Headache  . Dental Pain   (Consider location/radiation/quality/duration/timing/severity/associated sxs/prior Treatment) HPI      49 year old female presents complaining of a headache in her frontal and maxillary sinus areas for 2 weeks, dental pain, and diffuse itching all over her body. Symptoms all began 2 weeks ago. She also has congestion and rhinorrhea. She is worried she may have sinus infection. For the dental pain, she saw her dentist one week ago and she says she was referred to a different dentist for cleaning, and they cannot see her until May. No sublingual swelling or difficulty opening her mouth.  Past Medical History  Diagnosis Date  . Bipolar 1 disorder   . Claustrophobia   . Bronchitis   . Hypertension   . Back pain   . Hearing loss in right ear    Past Surgical History  Procedure Laterality Date  . Cholecystectomy    . Ligament repair      left arm   Family History  Problem Relation Age of Onset  . Cancer Mother   . Hypertension Father   . Diabetes Other    History  Substance Use Topics  . Smoking status: Current Every Day Smoker -- 1.00 packs/day    Types: Cigarettes  . Smokeless tobacco: Never Used  . Alcohol Use: No     Comment: states stopped drinking as New Years Resolution   OB History    No data available     Review of Systems  Constitutional: Negative for fever and chills.  HENT: Positive for congestion, dental problem, rhinorrhea and sinus pressure. Negative for ear pain.   Respiratory: Negative for cough and shortness of breath.   Cardiovascular: Negative for chest pain.  Neurological: Positive for headaches. Negative for dizziness, facial asymmetry, speech difficulty and weakness.  All other systems reviewed and are negative.   Allergies  Haloperidol decanoate; Ibuprofen;  Flexeril; Naproxen; and Tramadol  Home Medications   Prior to Admission medications   Medication Sig Start Date End Date Taking? Authorizing Provider  albuterol (PROVENTIL HFA;VENTOLIN HFA) 108 (90 BASE) MCG/ACT inhaler Inhale 2 puffs into the lungs every 6 (six) hours as needed for wheezing or shortness of breath.    Historical Provider, MD  amoxicillin (AMOXIL) 875 MG tablet Take 1 tablet (875 mg total) by mouth 2 (two) times daily. 06/12/14   Graylon GoodZachary H Wylee Ogden, PA-C  carbamazepine (EQUETRO) 200 MG CP12 12 hr capsule Take 200 mg by mouth at bedtime.     Historical Provider, MD  cyclobenzaprine (FLEXERIL) 10 MG tablet Take 1 tablet (10 mg total) by mouth 3 (three) times daily as needed for muscle spasms. 12/09/13   Tommie SamsJayce G Cook, DO  diazepam (VALIUM) 5 MG tablet Take 1 tablet (5 mg total) by mouth 2 (two) times daily. 05/20/14   Alvira MondayErin Schlossman, MD  HYDROcodone-acetaminophen (NORCO/VICODIN) 5-325 MG per tablet Take 1 tablet by mouth every 4 (four) hours as needed for moderate pain or severe pain. 05/20/14   Alvira MondayErin Schlossman, MD  hydrOXYzine (ATARAX/VISTARIL) 25 MG tablet Take 1 tablet (25 mg total) by mouth every 6 (six) hours as needed for itching. 05/20/14   Alvira MondayErin Schlossman, MD  hydrOXYzine (ATARAX/VISTARIL) 50 MG tablet Take 1 tablet (50 mg total) by mouth every 6 (six) hours as needed for itching. 06/12/14  Adrian Blackwater Ashni Lonzo, PA-C  losartan (COZAAR) 25 MG tablet Take 25 mg by mouth daily.    Historical Provider, MD  oxyCODONE-acetaminophen (PERCOCET/ROXICET) 5-325 MG per tablet Take 1 tablet by mouth every 8 (eight) hours as needed for severe pain. 12/09/13   Tommie Sams, DO  permethrin (ELIMITE) 5 % cream Apply to affected area once from neck down. Leave on for 8 hours then wash off.  Repeat in 1 week. 05/20/14   Alvira Monday, MD  predniSONE (DELTASONE) 10 MG tablet 4 tabs PO QD for 4 days; 3 tabs PO QD for 3 days; 2 tabs PO QD for 2 days; 1 tab PO QD for 1 day 06/12/14   Graylon Good, PA-C   BP 114/69  mmHg  Pulse 68  Temp(Src) 98.3 F (36.8 C) (Oral)  Resp 14  SpO2 98%  LMP 05/12/2014 Physical Exam  Constitutional: She is oriented to person, place, and time. Vital signs are normal. She appears well-developed and well-nourished. No distress.  HENT:  Head: Normocephalic and atraumatic.  Nose: Right sinus exhibits maxillary sinus tenderness and frontal sinus tenderness. Left sinus exhibits maxillary sinus tenderness and frontal sinus tenderness.  Mouth/Throat: Uvula is midline, oropharynx is clear and moist and mucous membranes are normal. No trismus in the jaw. Dental caries (multiple caries) present. No dental abscesses.  Pulmonary/Chest: Effort normal. No respiratory distress.  Neurological: She is alert and oriented to person, place, and time. She has normal strength. No cranial nerve deficit or sensory deficit. She exhibits normal muscle tone. Coordination and gait normal.  Skin: Skin is warm and dry. No rash noted. She is not diaphoretic.  Psychiatric: She has a normal mood and affect. Judgment normal.  Nursing note and vitals reviewed.   ED Course  Procedures (including critical care time) Labs Review Labs Reviewed - No data to display  Imaging Review No results found.   MDM   1. Other sinusitis   2. Other headache syndrome   3. Pain, dental    Treatment with amoxicillin, prednisone, hydroxyzine for itching, follow-up with her dentist and with primary care   Meds ordered this encounter  Medications  . amoxicillin (AMOXIL) 875 MG tablet    Sig: Take 1 tablet (875 mg total) by mouth 2 (two) times daily.    Dispense:  14 tablet    Refill:  0  . predniSONE (DELTASONE) 10 MG tablet    Sig: 4 tabs PO QD for 4 days; 3 tabs PO QD for 3 days; 2 tabs PO QD for 2 days; 1 tab PO QD for 1 day    Dispense:  30 tablet    Refill:  0  . hydrOXYzine (ATARAX/VISTARIL) 50 MG tablet    Sig: Take 1 tablet (50 mg total) by mouth every 6 (six) hours as needed for itching.     Dispense:  20 tablet    Refill:  0       Graylon Good, PA-C 06/12/14 [address] [place], [place] 06/12/14 1042

## 2014-06-12 NOTE — Discharge Instructions (Signed)
Dental Pain A tooth ache may be caused by cavities (tooth decay). Cavities expose the nerve of the tooth to air and hot or cold temperatures. It may come from an infection or abscess (also called a boil or furuncle) around your tooth. It is also often caused by dental caries (tooth decay). This causes the pain you are having. DIAGNOSIS  Your caregiver can diagnose this problem by exam. TREATMENT   If caused by an infection, it may be treated with medications which kill germs (antibiotics) and pain medications as prescribed by your caregiver. Take medications as directed.  Only take over-the-counter or prescription medicines for pain, discomfort, or fever as directed by your caregiver.  Whether the tooth ache today is caused by infection or dental disease, you should see your dentist as soon as possible for further care. SEEK MEDICAL CARE IF: The exam and treatment you received today has been provided on an emergency basis only. This is not a substitute for complete medical or dental care. If your problem worsens or new problems (symptoms) appear, and you are unable to meet with your dentist, call or return to this location. SEEK IMMEDIATE MEDICAL CARE IF:   You have a fever.  You develop redness and swelling of your face, jaw, or neck.  You are unable to open your mouth.  You have severe pain uncontrolled by pain medicine. MAKE SURE YOU:   Understand these instructions.  Will watch your condition.  Will get help right away if you are not doing well or get worse. Document Released: 04/24/2005 Document Revised: 07/17/2011 Document Reviewed: 12/11/2007 [facility] Patient Information 2015 [place], [place]. This information is not intended to replace advice given to you by your health care provider. Make sure you discuss any questions you have with your health care provider.  Dental Care and Dentist Visits Dental care supports good overall health. Regular dental visits can also help you  avoid dental pain, bleeding, infection, and other more serious health problems in the future. It is important to keep the mouth healthy because diseases in the teeth, gums, and other oral tissues can spread to other areas of the body. Some problems, such as diabetes, heart disease, and pre-term labor have been associated with poor oral health.  See your dentist every 6 months. If you experience emergency problems such as a toothache or broken tooth, go to the dentist right away. If you see your dentist regularly, you may catch problems early. It is easier to be treated for problems in the early stages.  WHAT TO EXPECT AT A DENTIST VISIT  Your dentist will look for many common oral health problems and recommend proper treatment. At your regular dental visit, you can expect:  Gentle cleaning of the teeth and gums. This includes scraping and polishing. This helps to remove the sticky substance around the teeth and gums (plaque). Plaque forms in the mouth shortly after eating. Over time, plaque hardens on the teeth as tartar. If tartar is not removed regularly, it can cause problems. Cleaning also helps remove stains.  Periodic X-rays. These pictures of the teeth and supporting bone will help your dentist assess the health of your teeth.  Periodic fluoride treatments. Fluoride is a natural mineral shown to help strengthen teeth. Fluoride treatmentinvolves applying a fluoride gel or varnish to the teeth. It is most commonly done in children.  Examination of the mouth, tongue, jaws, teeth, and gums to look for any oral health problems, such as:  Cavities (dental caries). This is  decay on the tooth caused by plaque, sugar, and acid in the mouth. It is best to catch a cavity when it is small.  Inflammation of the gums caused by plaque buildup (gingivitis).  Problems with the mouth or malformed or misaligned teeth.  Oral cancer or other diseases of the soft tissues or jaws. KEEP YOUR TEETH AND GUMS  HEALTHY For healthy teeth and gums, follow these general guidelines as well as your dentist's specific advice:  Have your teeth professionally cleaned at the dentist every 6 months.  Brush twice daily with a fluoride toothpaste.  Floss your teeth daily.  Ask your dentist if you need fluoride supplements, treatments, or fluoride toothpaste.  Eat a healthy diet. Reduce foods and drinks with added sugar.  Avoid smoking. TREATMENT FOR ORAL HEALTH PROBLEMS If you have oral health problems, treatment varies depending on the conditions present in your teeth and gums.  Your caregiver will most likely recommend good oral hygiene at each visit.  For cavities, gingivitis, or other oral health disease, your caregiver will perform a procedure to treat the problem. This is typically done at a separate appointment. Sometimes your caregiver will refer you to another dental specialist for specific tooth problems or for surgery. SEEK IMMEDIATE DENTAL CARE IF:  You have pain, bleeding, or soreness in the gum, tooth, jaw, or mouth area.  A permanent tooth becomes loose or separated from the gum socket.  You experience a blow or injury to the mouth or jaw area. Document Released: 01/04/2011 Document Revised: 07/17/2011 Document Reviewed: 01/04/2011 [facility] Patient Information 2015 [place], [place]. This information is not intended to replace advice given to you by your health care provider. Make sure you discuss any questions you have with your health care provider.  Headaches, Frequently Asked Questions MIGRAINE HEADACHES Q: What is migraine? What causes it? How can I treat it? A: Generally, migraine headaches begin as a dull ache. Then they develop into a constant, throbbing, and pulsating pain. You may experience pain at the temples. You may experience pain at the front or back of one or both sides of the head. The pain is usually accompanied by a combination  of:  Nausea.  Vomiting.  Sensitivity to light and noise. Some people (about 15%) experience an aura (see below) before an attack. The cause of migraine is believed to be chemical reactions in the brain. Treatment for migraine may include over-the-counter or prescription medications. It may also include self-help techniques. These include relaxation training and biofeedback.  Q: What is an aura? A: About 15% of people with migraine get an "aura". This is a sign of neurological symptoms that occur before a migraine headache. You may see wavy or jagged lines, dots, or flashing lights. You might experience tunnel vision or blind spots in one or both eyes. The aura can include visual or auditory hallucinations (something imagined). It may include disruptions in smell (such as strange odors), taste or touch. Other symptoms include:  Numbness.  A "pins and needles" sensation.  Difficulty in recalling or speaking the correct word. These neurological events may last as long as 60 minutes. These symptoms will fade as the headache begins. Q: What is a trigger? A: Certain physical or environmental factors can lead to or "trigger" a migraine. These include:  Foods.  Hormonal changes.  Weather.  Stress. It is important to remember that triggers are different for everyone. To help prevent migraine attacks, you need to figure out which triggers affect you. Keep a headache  diary. This is a good way to track triggers. The diary will help you talk to your healthcare professional about your condition. Q: Does weather affect migraines? A: Bright sunshine, hot, humid conditions, and drastic changes in barometric pressure may lead to, or "trigger," a migraine attack in some people. But studies have shown that weather does not act as a trigger for everyone with migraines. Q: What is the link between migraine and hormones? A: Hormones start and regulate many of your body's functions. Hormones keep your body in  balance within a constantly changing environment. The levels of hormones in your body are unbalanced at times. Examples are during menstruation, pregnancy, or menopause. That can lead to a migraine attack. In fact, about three quarters of all women with migraine report that their attacks are related to the menstrual cycle.  Q: Is there an increased risk of stroke for migraine sufferers? A: The likelihood of a migraine attack causing a stroke is very remote. That is not to say that migraine sufferers cannot have a stroke associated with their migraines. In persons under age 47, the most common associated factor for stroke is migraine headache. But over the course of a person's normal life span, the occurrence of migraine headache may actually be associated with a reduced risk of dying from cerebrovascular disease due to stroke.  Q: What are acute medications for migraine? A: Acute medications are used to treat the pain of the headache after it has started. Examples over-the-counter medications, NSAIDs, ergots, and triptans.  Q: What are the triptans? A: Triptans are the newest class of abortive medications. They are specifically targeted to treat migraine. Triptans are vasoconstrictors. They moderate some chemical reactions in the brain. The triptans work on receptors in your brain. Triptans help to restore the balance of a neurotransmitter called serotonin. Fluctuations in levels of serotonin are thought to be a main cause of migraine.  Q: Are over-the-counter medications for migraine effective? A: Over-the-counter, or "OTC," medications may be effective in relieving mild to moderate pain and associated symptoms of migraine. But you should see your caregiver before beginning any treatment regimen for migraine.  Q: What are preventive medications for migraine? A: Preventive medications for migraine are sometimes referred to as "prophylactic" treatments. They are used to reduce the frequency, severity, and  length of migraine attacks. Examples of preventive medications include antiepileptic medications, antidepressants, beta-blockers, calcium channel blockers, and NSAIDs (nonsteroidal anti-inflammatory drugs). Q: Why are anticonvulsants used to treat migraine? A: During the past few years, there has been an increased interest in antiepileptic drugs for the prevention of migraine. They are sometimes referred to as "anticonvulsants". Both epilepsy and migraine may be caused by similar reactions in the brain.  Q: Why are antidepressants used to treat migraine? A: Antidepressants are typically used to treat people with depression. They may reduce migraine frequency by regulating chemical levels, such as serotonin, in the brain.  Q: What alternative therapies are used to treat migraine? A: The term "alternative therapies" is often used to describe treatments considered outside the scope of conventional Western medicine. Examples of alternative therapy include acupuncture, acupressure, and yoga. Another common alternative treatment is herbal therapy. Some herbs are believed to relieve headache pain. Always discuss alternative therapies with your caregiver before proceeding. Some herbal products contain arsenic and other toxins. TENSION HEADACHES Q: What is a tension-type headache? What causes it? How can I treat it? A: Tension-type headaches occur randomly. They are often the result of temporary stress, anxiety, fatigue,  or anger. Symptoms include soreness in your temples, a tightening band-like sensation around your head (a "vice-like" ache). Symptoms can also include a pulling feeling, pressure sensations, and contracting head and neck muscles. The headache begins in your forehead, temples, or the back of your head and neck. Treatment for tension-type headache may include over-the-counter or prescription medications. Treatment may also include self-help techniques such as relaxation training and  biofeedback. CLUSTER HEADACHES Q: What is a cluster headache? What causes it? How can I treat it? A: Cluster headache gets its name because the attacks come in groups. The pain arrives with little, if any, warning. It is usually on one side of the head. A tearing or bloodshot eye and a runny nose on the same side of the headache may also accompany the pain. Cluster headaches are believed to be caused by chemical reactions in the brain. They have been described as the most severe and intense of any headache type. Treatment for cluster headache includes prescription medication and oxygen. SINUS HEADACHES Q: What is a sinus headache? What causes it? How can I treat it? A: When a cavity in the bones of the face and skull (a sinus) becomes inflamed, the inflammation will cause localized pain. This condition is usually the result of an allergic reaction, a tumor, or an infection. If your headache is caused by a sinus blockage, such as an infection, you will probably have a fever. An x-ray will confirm a sinus blockage. Your caregiver's treatment might include antibiotics for the infection, as well as antihistamines or decongestants.  REBOUND HEADACHES Q: What is a rebound headache? What causes it? How can I treat it? A: A pattern of taking acute headache medications too often can lead to a condition known as "rebound headache." A pattern of taking too much headache medication includes taking it more than 2 days per week or in excessive amounts. That means more than the label or a caregiver advises. With rebound headaches, your medications not only stop relieving pain, they actually begin to cause headaches. Doctors treat rebound headache by tapering the medication that is being overused. Sometimes your caregiver will gradually substitute a different type of treatment or medication. Stopping may be a challenge. Regularly overusing a medication increases the potential for serious side effects. Consult a caregiver  if you regularly use headache medications more than 2 days per week or more than the label advises. ADDITIONAL QUESTIONS AND ANSWERS Q: What is biofeedback? A: Biofeedback is a self-help treatment. Biofeedback uses special equipment to monitor your body's involuntary physical responses. Biofeedback monitors:  Breathing.  Pulse.  Heart rate.  Temperature.  Muscle tension.  Brain activity. Biofeedback helps you refine and perfect your relaxation exercises. You learn to control the physical responses that are related to stress. Once the technique has been mastered, you do not need the equipment any more. Q: Are headaches hereditary? A: Four out of five (80%) of people that suffer report a family history of migraine. Scientists are not sure if this is genetic or a family predisposition. Despite the uncertainty, a child has a 50% chance of having migraine if one parent suffers. The child has a 75% chance if both parents suffer.  Q: Can children get headaches? A: By the time they reach high school, most young people have experienced some type of headache. Many safe and effective approaches or medications can prevent a headache from occurring or stop it after it has begun.  Q: What type of doctor should I see  to diagnose and treat my headache? A: Start with your primary caregiver. Discuss his or her experience and approach to headaches. Discuss methods of classification, diagnosis, and treatment. Your caregiver may decide to recommend you to a headache specialist, depending upon your symptoms or other physical conditions. Having diabetes, allergies, etc., may require a more comprehensive and inclusive approach to your headache. The National Head[employer] Foundation will provide, upon request, a list of [facility] physician members in your state. Document Released: 07/15/2003 Document Revised: 07/17/2011 Document Reviewed: 12/23/2007 [facility] Patient Information 2015 [place], [place]. This information is not  intended to replace advice given to you by your health care provider. Make sure you discuss any questions you have with your health care provider.  General Headache Without Cause A general headache is pain or discomfort felt around the head or neck area. The cause may not be found.  HOME CARE   Keep all doctor visits.  Only take medicines as told by your doctor.  Lie down in a dark, quiet room when you have a headache.  Keep a journal to find out if certain things bring on headaches. For example, write down:  What you eat and drink.  How much sleep you get.  Any change to your diet or medicines.  Relax by getting a massage or doing other relaxing activities.  Put ice or heat packs on the head and neck area as told by your doctor.  Lessen stress.  Sit up straight. Do not tighten (tense) your muscles.  Quit smoking if you smoke.  Lessen how much alcohol you drink.  Lessen how much caffeine you drink, or stop drinking caffeine.  Eat and sleep on a regular schedule.  Get 7 to 9 hours of sleep, or as told by your doctor.  Keep lights dim if bright lights bother you or make your headaches worse. GET HELP RIGHT AWAY IF:   Your headache becomes really bad.  You have a fever.  You have a stiff neck.  You have trouble seeing.  Your muscles are weak, or you lose muscle control.  You lose your balance or have trouble walking.  You feel like you will pass out (faint), or you pass out.  You have really bad symptoms that are different than your first symptoms.  You have problems with the medicines given to you by your doctor.  Your medicines do not work.  Your headache feels different than the other headaches.  You feel sick to your stomach (nauseous) or throw up (vomit). MAKE SURE YOU:   Understand these instructions.  Will watch your condition.  Will get help right away if you are not doing well or get worse. Document Released: 02/01/2008 Document Revised:  07/17/2011 Document Reviewed: 04/14/2011 [facility] Patient Information 2015 [place], [place]. This information is not intended to replace advice given to you by your health care provider. Make sure you discuss any questions you have with your health care provider.  Sinusitis Sinusitis is redness, soreness, and inflammation of the paranasal sinuses. Paranasal sinuses are air pockets within the bones of your face (beneath the eyes, the middle of the forehead, or above the eyes). In healthy paranasal sinuses, mucus is able to drain out, and air is able to circulate through them by way of your nose. However, when your paranasal sinuses are inflamed, mucus and air can become trapped. This can allow bacteria and other germs to grow and cause infection. Sinusitis can develop quickly and last only a short time (acute) or continue  over a long period (chronic). Sinusitis that lasts for more than 12 weeks is considered chronic.  CAUSES  Causes of sinusitis include:  Allergies.  Structural abnormalities, such as displacement of the cartilage that separates your nostrils (deviated septum), which can decrease the air flow through your nose and sinuses and affect sinus drainage.  Functional abnormalities, such as when the small hairs (cilia) that line your sinuses and help remove mucus do not work properly or are not present. SIGNS AND SYMPTOMS  Symptoms of acute and chronic sinusitis are the same. The primary symptoms are pain and pressure around the affected sinuses. Other symptoms include:  Upper toothache.  Earache.  Headache.  Bad breath.  Decreased sense of smell and taste.  A cough, which worsens when you are lying flat.  Fatigue.  Fever.  Thick drainage from your nose, which often is green and may contain pus (purulent).  Swelling and warmth over the affected sinuses. DIAGNOSIS  Your health care provider will perform a physical exam. During the exam, your health care provider may:  Look  in your nose for signs of abnormal growths in your nostrils (nasal polyps).  Tap over the affected sinus to check for signs of infection.  View the inside of your sinuses (endoscopy) using an imaging device that has a light attached (endoscope). If your health care provider suspects that you have chronic sinusitis, one or more of the following tests may be recommended:  Allergy tests.  Nasal culture. A sample of mucus is taken from your nose, sent to a lab, and screened for bacteria.  Nasal cytology. A sample of mucus is taken from your nose and examined by your health care provider to determine if your sinusitis is related to an allergy. TREATMENT  Most cases of acute sinusitis are related to a viral infection and will resolve on their own within 10 days. Sometimes medicines are prescribed to help relieve symptoms (pain medicine, decongestants, nasal steroid sprays, or saline sprays).  However, for sinusitis related to a bacterial infection, your health care provider will prescribe antibiotic medicines. These are medicines that will help kill the bacteria causing the infection.  Rarely, sinusitis is caused by a fungal infection. In theses cases, your health care provider will prescribe antifungal medicine. For some cases of chronic sinusitis, surgery is needed. Generally, these are cases in which sinusitis recurs more than 3 times per year, despite other treatments. HOME CARE INSTRUCTIONS   Drink plenty of water. Water helps thin the mucus so your sinuses can drain more easily.  Use a humidifier.  Inhale steam 3 to 4 times a day (for example, sit in the bathroom with the shower running).  Apply a warm, moist washcloth to your face 3 to 4 times a day, or as directed by your health care provider.  Use saline nasal sprays to help moisten and clean your sinuses.  Take medicines only as directed by your health care provider.  If you were prescribed either an antibiotic or antifungal  medicine, finish it all even if you start to feel better. SEEK IMMEDIATE MEDICAL CARE IF:  You have increasing pain or severe headaches.  You have nausea, vomiting, or drowsiness.  You have swelling around your face.  You have vision problems.  You have a stiff neck.  You have difficulty breathing. MAKE SURE YOU:   Understand these instructions.  Will watch your condition.  Will get help right away if you are not doing well or get worse. Document Released: 04/24/2005  Document Revised: 09/08/2013 Document Reviewed: 05/09/2011 [facility] Patient Information 2015 [place], [place]. This information is not intended to replace advice given to you by your health care provider. Make sure you discuss any questions you have with your health care provider.  Sinus Headache A sinus headache is when your sinuses become clogged or swollen. Sinus headaches can range from mild to severe.  CAUSES A sinus headache can have different causes, such as:  Colds.  Sinus infections.  Allergies. SYMPTOMS  Symptoms of a sinus headache may vary and can include:  Headache.  Pain or pressure in the face.  Congested or runny nose.  Fever.  Inability to smell.  Pain in upper teeth. Weather changes can make symptoms worse. TREATMENT  The treatment of a sinus headache depends on the cause.  Sinus pain caused by a sinus infection may be treated with antibiotic medicine.  Sinus pain caused by allergies may be helped by allergy medicines (antihistamines) and medicated nasal sprays.  Sinus pain caused by congestion may be helped by flushing the nose and sinuses with saline solution. HOME CARE INSTRUCTIONS   If antibiotics are prescribed, take them as directed. Finish them even if you start to feel better.  Only take over-the-counter or prescription medicines for pain, discomfort, or fever as directed by your caregiver.  If you have congestion, use a nasal spray to help reduce pressure. SEEK  IMMEDIATE MEDICAL CARE IF:  You have a fever.  You have headaches more than once a week.  You have sensitivity to light or sound.  You have repeated nausea and vomiting.  You have vision problems.  You have sudden, severe pain in your face or head.  You have a seizure.  You are confused.  Your sinus headaches do not get better after treatment. Many people think they have a sinus headache when they actually have migraines or tension headaches. MAKE SURE YOU:   Understand these instructions.  Will watch your condition.  Will get help right away if you are not doing well or get worse. Document Released: 06/01/2004 Document Revised: 07/17/2011 Document Reviewed: 07/23/2010 [facility] Patient Information 2015 [place], [place]. This information is not intended to replace advice given to you by your health care provider. Make sure you discuss any questions you have with your health care provider.

## 2014-06-12 NOTE — ED Notes (Signed)
Pt c/o persistent and constant HA onset 2 weeks; seen at [facility] 2 weeks ago Also reports rash all over body Also c/o dental pain onset 1 week; saw her dentist last week and has appt on 5/3 Alert, no signs of acute distress.

## 2014-07-22 ENCOUNTER — Ambulatory Visit: Payer: Medicaid Other | Admitting: Family Medicine

## 2014-08-04 ENCOUNTER — Ambulatory Visit: Payer: Medicaid Other | Admitting: Family Medicine

## 2014-09-07 ENCOUNTER — Encounter (HOSPITAL_COMMUNITY): Payer: Self-pay

## 2014-09-07 ENCOUNTER — Emergency Department (HOSPITAL_COMMUNITY)
Admission: EM | Admit: 2014-09-07 | Discharge: 2014-09-07 | Disposition: A | Discharge status: Home / Self Care | Payer: Medicaid Other | Attending: Emergency Medicine | Admitting: Emergency Medicine

## 2014-09-07 DIAGNOSIS — G8929 Other chronic pain: Secondary | ICD-10-CM | POA: Insufficient documentation

## 2014-09-07 DIAGNOSIS — K0889 Other specified disorders of teeth and supporting structures: Secondary | ICD-10-CM

## 2014-09-07 DIAGNOSIS — M545 Low back pain, unspecified: Secondary | ICD-10-CM

## 2014-09-07 DIAGNOSIS — F419 Anxiety disorder, unspecified: Secondary | ICD-10-CM | POA: Insufficient documentation

## 2014-09-07 DIAGNOSIS — Z720 Tobacco use: Secondary | ICD-10-CM | POA: Insufficient documentation

## 2014-09-07 DIAGNOSIS — Z79899 Other long term (current) drug therapy: Secondary | ICD-10-CM | POA: Diagnosis not present

## 2014-09-07 DIAGNOSIS — Z7952 Long term (current) use of systemic steroids: Secondary | ICD-10-CM | POA: Diagnosis not present

## 2014-09-07 DIAGNOSIS — I10 Essential (primary) hypertension: Secondary | ICD-10-CM | POA: Insufficient documentation

## 2014-09-07 DIAGNOSIS — R252 Cramp and spasm: Secondary | ICD-10-CM | POA: Insufficient documentation

## 2014-09-07 DIAGNOSIS — Z8709 Personal history of other diseases of the respiratory system: Secondary | ICD-10-CM | POA: Diagnosis not present

## 2014-09-07 DIAGNOSIS — K029 Dental caries, unspecified: Secondary | ICD-10-CM | POA: Diagnosis not present

## 2014-09-07 DIAGNOSIS — H9191 Unspecified hearing loss, right ear: Secondary | ICD-10-CM | POA: Diagnosis not present

## 2014-09-07 DIAGNOSIS — K088 Other specified disorders of teeth and supporting structures: Secondary | ICD-10-CM | POA: Diagnosis present

## 2014-09-07 DIAGNOSIS — F319 Bipolar disorder, unspecified: Secondary | ICD-10-CM | POA: Insufficient documentation

## 2014-09-07 HISTORY — DX: Dorsalgia, unspecified: M54.9

## 2014-09-07 HISTORY — DX: Other chronic pain: G89.29

## 2014-09-07 MED ORDER — OXYCODONE-ACETAMINOPHEN 5-325 MG PO TABS
1.0000 | ORAL_TABLET | Freq: Once | ORAL | Status: AC
  Administered 2014-09-07: 1 via ORAL
  Filled 2014-09-07: qty 1

## 2014-09-07 MED ORDER — OXYCODONE-ACETAMINOPHEN 5-325 MG PO TABS: 1.0000 | ORAL_TABLET | Freq: Three times a day (TID) | ORAL | Status: DC | PRN

## 2014-09-07 MED ORDER — CARBAMAZEPINE ER 200 MG PO CP12: 200.0000 mg | ORAL_CAPSULE | Freq: Every day | ORAL | Status: DC

## 2014-09-07 MED ORDER — AMOXICILLIN 875 MG PO TABS: 875.0000 mg | ORAL_TABLET | Freq: Two times a day (BID) | ORAL | Status: DC

## 2014-09-07 MED ORDER — LOSARTAN POTASSIUM 25 MG PO TABS: 25.0000 mg | ORAL_TABLET | Freq: Every day | ORAL | Status: DC

## 2014-09-07 MED ORDER — ALBUTEROL SULFATE HFA 108 (90 BASE) MCG/ACT IN AERS: 2.0000 | INHALATION_SPRAY | Freq: Four times a day (QID) | RESPIRATORY_TRACT | Status: DC | PRN

## 2014-09-07 MED ORDER — ONDANSETRON 4 MG PO TBDP
4.0000 mg | ORAL_TABLET | Freq: Once | ORAL | Status: AC
  Administered 2014-09-07: 4 mg via ORAL
  Filled 2014-09-07: qty 1

## 2014-09-07 NOTE — ED Notes (Signed)
Pt. Is here for a tootache to her upper front tooth, which appears to have decay and broken. No sswelling noted airway patent Also having lower back pain with spasms that started approximately 2 years ago. GCS 15  Skin is warm and dry.

## 2014-09-07 NOTE — Discharge Instructions (Signed)
Back Pain, Adult Low back pain is very common. About 1 in 5 people have back pain.The cause of low back pain is rarely dangerous. The pain often gets better over time.About half of people with a sudden onset of back pain feel better in just 2 weeks. About 8 in 10 people feel better by 6 weeks.  CAUSES Some common causes of back pain include:  Strain of the muscles or ligaments supporting the spine.  Wear and tear (degeneration) of the spinal discs.  Arthritis.  Direct injury to the back. DIAGNOSIS Most of the time, the direct cause of low back pain is not known.However, back pain can be treated effectively even when the exact cause of the pain is unknown.Answering your caregiver's questions about your overall health and symptoms is one of the most accurate ways to make sure the cause of your pain is not dangerous. If your caregiver needs more information, he or she may order lab work or imaging tests (X-rays or MRIs).However, even if imaging tests show changes in your back, this usually does not require surgery. HOME CARE INSTRUCTIONS For many people, back pain returns.Since low back pain is rarely dangerous, it is often a condition that people can learn to [facility] their own.   Remain active. It is stressful on the back to sit or stand in one place. Do not sit, drive, or stand in one place for more than 30 minutes at a time. Take short walks on level surfaces as soon as pain allows.Try to increase the length of time you walk each day.  Do not stay in bed.Resting more than 1 or 2 days can delay your recovery.  Do not avoid exercise or work.Your body is made to move.It is not dangerous to be active, even though your back may hurt.Your back will likely heal faster if you return to being active before your pain is gone.  Pay attention to your body when you bend and lift. Many people have less discomfortwhen lifting if they bend their knees, keep the load close to their bodies,and  avoid twisting. Often, the most comfortable positions are those that put less stress on your recovering back.  Find a comfortable position to sleep. Use a firm mattress and lie on your side with your knees slightly bent. If you lie on your back, put a pillow under your knees.  Only take over-the-counter or prescription medicines as directed by your caregiver. Over-the-counter medicines to reduce pain and inflammation are often the most helpful.Your caregiver may prescribe muscle relaxant drugs.These medicines help dull your pain so you can more quickly return to your normal activities and healthy exercise.  Put ice on the injured area.  Put ice in a plastic bag.  Place a towel between your skin and the bag.  Leave the ice on for 15-20 minutes, 03-04 times a day for the first 2 to 3 days. After that, ice and heat may be alternated to reduce pain and spasms.  Ask your caregiver about trying back exercises and gentle massage. This may be of some benefit.  Avoid feeling anxious or stressed.Stress increases muscle tension and can worsen back pain.It is important to recognize when you are anxious or stressed and learn ways to manage it.Exercise is a great option. SEEK MEDICAL CARE IF:  You have pain that is not relieved with rest or medicine.  You have pain that does not improve in 1 week.  You have new symptoms.  You are generally not feeling well. SEEK  IMMEDIATE MEDICAL CARE IF:   You have pain that radiates from your back into your legs.  You develop new bowel or bladder control problems.  You have unusual weakness or numbness in your arms or legs.  You develop nausea or vomiting.  You develop abdominal pain.  You feel faint. Document Released: 04/24/2005 Document Revised: 10/24/2011 Document Reviewed: 08/26/2013 [facility] Patient Information 2015 [place], [place]. This information is not intended to replace advice given to you by your health care provider. Make sure you  discuss any questions you have with your health care provider.  Dental Pain A tooth ache may be caused by cavities (tooth decay). Cavities expose the nerve of the tooth to air and hot or cold temperatures. It may come from an infection or abscess (also called a boil or furuncle) around your tooth. It is also often caused by dental caries (tooth decay). This causes the pain you are having. DIAGNOSIS  Your caregiver can diagnose this problem by exam. TREATMENT   If caused by an infection, it may be treated with medications which kill germs (antibiotics) and pain medications as prescribed by your caregiver. Take medications as directed.  Only take over-the-counter or prescription medicines for pain, discomfort, or fever as directed by your caregiver.  Whether the tooth ache today is caused by infection or dental disease, you should see your dentist as soon as possible for further care. SEEK MEDICAL CARE IF: The exam and treatment you received today has been provided on an emergency basis only. This is not a substitute for complete medical or dental care. If your problem worsens or new problems (symptoms) appear, and you are unable to meet with your dentist, call or return to this location. SEEK IMMEDIATE MEDICAL CARE IF:   You have a fever.  You develop redness and swelling of your face, jaw, or neck.  You are unable to open your mouth.  You have severe pain uncontrolled by pain medicine. MAKE SURE YOU:   Understand these instructions.  Will watch your condition.  Will get help right away if you are not doing well or get worse. Document Released: 04/24/2005 Document Revised: 07/17/2011 Document Reviewed: 12/11/2007 [facility] Patient Information 2015 [place], [place]. This information is not intended to replace advice given to you by your health care provider. Make sure you discuss any questions you have with your health care provider.

## 2014-09-07 NOTE — ED Notes (Signed)
Declined W/C at D/C and was escorted to lobby by RN. 
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
## 2014-09-10 ENCOUNTER — Telehealth: Payer: Self-pay | Admitting: Emergency Medicine

## 2014-10-26 ENCOUNTER — Ambulatory Visit: Payer: Medicaid Other | Admitting: Internal Medicine

## 2014-10-27 ENCOUNTER — Ambulatory Visit: Payer: Medicaid Other | Admitting: Family Medicine

## 2014-10-30 ENCOUNTER — Emergency Department (HOSPITAL_COMMUNITY): Payer: Medicaid Other

## 2014-10-30 ENCOUNTER — Encounter (HOSPITAL_COMMUNITY): Payer: Self-pay | Admitting: Emergency Medicine

## 2014-10-30 ENCOUNTER — Emergency Department (HOSPITAL_COMMUNITY)
Admission: EM | Admit: 2014-10-30 | Discharge: 2014-10-30 | Disposition: A | Discharge status: Home / Self Care | Payer: Medicaid Other | Attending: Emergency Medicine | Admitting: Emergency Medicine

## 2014-10-30 DIAGNOSIS — M79675 Pain in left toe(s): Secondary | ICD-10-CM | POA: Diagnosis not present

## 2014-10-30 DIAGNOSIS — G8929 Other chronic pain: Secondary | ICD-10-CM

## 2014-10-30 DIAGNOSIS — H9190 Unspecified hearing loss, unspecified ear: Secondary | ICD-10-CM | POA: Insufficient documentation

## 2014-10-30 DIAGNOSIS — M545 Low back pain: Secondary | ICD-10-CM | POA: Insufficient documentation

## 2014-10-30 DIAGNOSIS — I10 Essential (primary) hypertension: Secondary | ICD-10-CM | POA: Insufficient documentation

## 2014-10-30 DIAGNOSIS — Z8709 Personal history of other diseases of the respiratory system: Secondary | ICD-10-CM | POA: Insufficient documentation

## 2014-10-30 DIAGNOSIS — Z8659 Personal history of other mental and behavioral disorders: Secondary | ICD-10-CM | POA: Diagnosis not present

## 2014-10-30 DIAGNOSIS — Z720 Tobacco use: Secondary | ICD-10-CM | POA: Insufficient documentation

## 2014-10-30 DIAGNOSIS — M542 Cervicalgia: Secondary | ICD-10-CM | POA: Diagnosis present

## 2014-10-30 DIAGNOSIS — Z79899 Other long term (current) drug therapy: Secondary | ICD-10-CM | POA: Diagnosis not present

## 2014-10-30 DIAGNOSIS — Z792 Long term (current) use of antibiotics: Secondary | ICD-10-CM | POA: Insufficient documentation

## 2014-10-30 HISTORY — DX: Cervicalgia: M54.2

## 2014-10-30 HISTORY — DX: Headache: R51

## 2014-10-30 HISTORY — DX: Other chronic pain: G89.29

## 2014-10-30 HISTORY — DX: Pain in left foot: M79.672

## 2014-10-30 HISTORY — DX: Headache, unspecified: R51.9

## 2014-10-30 HISTORY — DX: Disorder of teeth and supporting structures, unspecified: K08.9

## 2014-10-30 MED ORDER — METHOCARBAMOL 500 MG PO TABS: 1000.0000 mg | ORAL_TABLET | Freq: Four times a day (QID) | ORAL | Status: DC | PRN

## 2014-10-30 MED ORDER — NAPROXEN 250 MG PO TABS: 250.0000 mg | ORAL_TABLET | Freq: Two times a day (BID) | ORAL | Status: DC | PRN

## 2014-10-30 NOTE — ED Provider Notes (Signed)
CSN: [id number]     Arrival date & time 10/30/14  1020 History   First MD Initiated Contact with Patient 10/30/14 1110     Chief Complaint  Patient presents with  . Neck Pain  . Back Pain  . Toe Pain      HPI Pt was seen at 1110. Per pt, c/o gradual onset and persistence of constant acute flair of her chronic low back and neck "pain" for the past several years. Pt also c/o acute flair of her chronic left great toe pain "since I broke it" several years ago. Denies any change in her usual chronic pain pattern.  Pain worsens with palpation of the area and body position changes. Pt states she cancelled her PMD appt 3 days ago and rescheduled it for next month. Pt is requesting "some pain meds" until that appt. Denies incont/retention of bowel or bladder, no saddle anesthesia, no focal motor weakness, no tingling/numbness in extremities, no fevers, no injury, no abd pain.   The symptoms have been associated with no other complaints. The patient has a significant history of similar symptoms previously, recently being evaluated for this complaint and multiple prior evals for same.     Past Medical History  Diagnosis Date  . Bipolar 1 disorder   . Claustrophobia   . Bronchitis   . Hypertension   . Hearing loss in right ear   . Chronic back pain   . Chronic neck pain   . Chronic dental pain   . Headache   . Chronic pain in left foot     since great toe fx   Past Surgical History  Procedure Laterality Date  . Cholecystectomy    . Ligament repair      left arm   Family History  Problem Relation Age of Onset  . Cancer Mother   . Hypertension Father   . Diabetes Other    History  Substance Use Topics  . Smoking status: Current Every Day Smoker -- 1.00 packs/day    Types: Cigarettes  . Smokeless tobacco: Never Used  . Alcohol Use: No     Comment: states stopped drinking as New Years Resolution    Review of Systems ROS: Statement: All systems negative except as marked or noted in  the HPI; Constitutional: Negative for fever and chills. ; ; Eyes: Negative for eye pain, redness and discharge. ; ; ENMT: Negative for ear pain, hoarseness, nasal congestion, sinus pressure and sore throat. ; ; Cardiovascular: Negative for chest pain, palpitations, diaphoresis, dyspnea and peripheral edema. ; ; Respiratory: Negative for cough, wheezing and stridor. ; ; Gastrointestinal: Negative for nausea, vomiting, diarrhea, abdominal pain, blood in stool, hematemesis, jaundice and rectal bleeding. . ; ; Genitourinary: Negative for dysuria, flank pain and hematuria. ; ; Musculoskeletal: +chronic toe pain, back pain and neck pain. Negative for swelling and new trauma.; ; Skin: Negative for pruritus, rash, abrasions, blisters, bruising and skin lesion.; ; Neuro: Negative for headache, lightheadedness and neck stiffness. Negative for weakness, altered level of consciousness , altered mental status, extremity weakness, paresthesias, involuntary movement, seizure and syncope.     Allergies  Haloperidol decanoate; Ibuprofen; Flexeril; Naproxen; and Tramadol  Home Medications   Prior to Admission medications   Medication Sig Start Date End Date Taking? Authorizing Provider  albuterol (PROVENTIL HFA;VENTOLIN HFA) 108 (90 BASE) MCG/ACT inhaler Inhale 2 puffs into the lungs every 6 (six) hours as needed for wheezing or shortness of breath. 09/07/14   Marlon Pel, PA-C  amoxicillin (  AMOXIL) 875 MG tablet Take 1 tablet (875 mg total) by mouth 2 (two) times daily. 09/07/14   Tiffany Neva Seat, PA-C  carbamazepine (EQUETRO) 200 MG CP12 12 hr capsule Take 1 capsule (200 mg total) by mouth at bedtime. 09/07/14   Tiffany Neva Seat, PA-C  cyclobenzaprine (FLEXERIL) 10 MG tablet Take 1 tablet (10 mg total) by mouth 3 (three) times daily as needed for muscle spasms. 12/09/13   Tommie Sams, DO  diazepam (VALIUM) 5 MG tablet Take 1 tablet (5 mg total) by mouth 2 (two) times daily. 05/20/14   Alvira Monday, MD   HYDROcodone-acetaminophen (NORCO/VICODIN) 5-325 MG per tablet Take 1 tablet by mouth every 4 (four) hours as needed for moderate pain or severe pain. 05/20/14   Alvira Monday, MD  hydrOXYzine (ATARAX/VISTARIL) 25 MG tablet Take 1 tablet (25 mg total) by mouth every 6 (six) hours as needed for itching. 05/20/14   Alvira Monday, MD  hydrOXYzine (ATARAX/VISTARIL) 50 MG tablet Take 1 tablet (50 mg total) by mouth every 6 (six) hours as needed for itching. 06/12/14   Graylon Good, PA-C  losartan (COZAAR) 25 MG tablet Take 1 tablet (25 mg total) by mouth daily. 09/07/14   Marlon Pel, PA-C  oxyCODONE-acetaminophen (PERCOCET/ROXICET) 5-325 MG per tablet Take 1 tablet by mouth every 8 (eight) hours as needed for severe pain. 09/07/14   Tiffany Neva Seat, PA-C  permethrin (ELIMITE) 5 % cream Apply to affected area once from neck down. Leave on for 8 hours then wash off.  Repeat in 1 week. 05/20/14   Alvira Monday, MD  predniSONE (DELTASONE) 10 MG tablet 4 tabs PO QD for 4 days; 3 tabs PO QD for 3 days; 2 tabs PO QD for 2 days; 1 tab PO QD for 1 day 06/12/14   Graylon Good, PA-C   BP 116/69 mmHg  Pulse 90  Temp(Src) 98 F (36.7 C) (Oral)  Resp 20  Ht  (1.702 m)  SpO2 100%  LMP 10/16/2014 Physical Exam  1115: Physical examination:  Nursing notes reviewed; Vital signs and O2 SAT reviewed;  Constitutional: Well developed, Well nourished, Well hydrated, In no acute distress; Head:  Normocephalic, atraumatic; Eyes: EOMI, PERRL, No scleral icterus; ENMT: Mouth and pharynx normal, Mucous membranes moist; Neck: Supple, Full range of motion, No lymphadenopathy; Cardiovascular: Regular rate and rhythm, No murmur, rub, or gallop; Respiratory: Breath sounds clear & equal bilaterally, No rales, rhonchi, wheezes.  Speaking full sentences with ease, Normal respiratory effort/excursion; Chest: Nontender, Movement normal; Abdomen: Soft, Nontender, Nondistended, Normal bowel sounds; Genitourinary: No CVA tenderness;  Spine:  No midline CS, TS, LS tenderness. +TTP left cervical and lumbar paraspinal muscles. No rash.;; Extremities: Pulses normal, Left great toe with minimal generalized TTP and without edema, ecchymosis, rash, deformity or open wounds. Brisk cap refill in toes, W/D/good color. Strong pedal pulses. NT left foot/ankle/knee. No deformity. No rash. No edema. No calf tenderness, edema or asymmetry.; Neuro: AA&Ox3, Major CN grossly intact.  Speech clear. No gross focal motor or sensory deficits in extremities. Climbs on and off stretcher easily by herself. Gait steady.; Skin: Color normal, Warm, Dry.   ED Course  Procedures       EKG Interpretation None      MDM  MDM Reviewed: previous chart, nursing note and vitals Reviewed previous: MRI, x-ray and CT scan Interpretation: x-ray     Dg Foot Complete Left 10/30/2014   CLINICAL DATA:  First toe pain since a fall in January of 2016, no acute  injury, initial encounter  EXAM: LEFT FOOT - COMPLETE 3+ VIEW  COMPARISON:  07/23/2013  FINDINGS: No acute fracture or dislocation is noted. No gross soft tissue abnormality is seen. A lucency is noted along the lateral aspect at the base of the first distal phalanx. This may be related to incomplete healing of prior avulsion fracture.  IMPRESSION: Possible healed fracture at the base of the first distal phalanx laterally. No other focal abnormality is seen.   Electronically Signed   By: Alcide Clever M.D.   On: 10/30/2014 11:52    1200:  Pt states she never f/u with Ortho or her PMD after her "left foot fx" several months ago. XR without acute fx, but with possible incomplete healing of prior fx; tx symptomatically and f/u Podiatry vs Ortho. Pt aware she will not be rx narcotic pain medications today, as the ED is not the venue to tx chronic pain. Pt then started to voice complaints regarding various other areas of chronic pain while stating she "needs some pain medicines for it."  Included in the multiple new  complaints, pt held out her index finger of her right hand and said "this hurts too." States she "just woke up with it hurting" and "needs pain medicine." Pt denies injury. There is FROM of her NT right index finger, no edema, no ecchymosis, no erythema, no deformity, no open wounds, no ligament laxity, +brisk cap refill in fingertip, +finger is W/D/good color, +strong radial pulse. Right hand is NT.  I explained to the pt that she did not need a XR or narcotic pain medication for this and it could be treated symptomatically at this time. Pt has a long hx of chronic pain with multiple ED visits for same.  Pt endorses acute flair of her usual long standing chronic pains today, no change from her usual chronic pain patterns.  Pt encouraged to f/u with her PMD, Pain Management doctor, and Podiatry vs Ortho MD for good continuity of care and control of her chronic pain.  Verb understanding.     Samuel Jester, DO 11/02/14 2214

## 2014-10-30 NOTE — ED Notes (Signed)
Patient placed back in room.

## 2014-10-30 NOTE — ED Notes (Signed)
Patient complains that MD did not address finger. MD at the bedside and spoke with patient about finger being injured. MD Stated, "Ice your finger and rest it. You have complete range of motion." Patient reassured of any questions and encouraged about her visit. Reassured the MD did address everything.

## 2014-10-30 NOTE — ED Notes (Signed)
Pt. Stated, I've had toe pain, back pain, neck pain and of course my tooth has been hurting for a year.

## 2014-10-30 NOTE — Discharge Instructions (Signed)
°Emergency Department Resource Guide °1) Find a Doctor and Pay Out of Pocket °Although you won't have to find out who is covered by your insurance plan, it is a good idea to ask around and get recommendations. You will then need to call the office and see if the doctor you have chosen will accept you as a new patient and what types of options they offer for patients who are self-pay. Some doctors offer discounts or will set up payment plans for their patients who do not have insurance, but you will need to ask so you aren't surprised when you get to your appointment. ° °2) Contact Your Local Health Department °Not all health departments have doctors that can see patients for sick visits, but many do, so it is worth a call to see if yours does. If you don't know where your local health department is, you can check in your phone book. The CDC also has a tool to help you locate your state's health department, and many state websites also have listings of all of their local health departments. ° °3) Find a Walk-in Clinic °If your illness is not likely to be very severe or complicated, you may want to try a walk in clinic. These are popping up all over the country in pharmacies, drugstores, and shopping centers. They're usually staffed by nurse practitioners or physician assistants that have been trained to treat common illnesses and complaints. They're usually fairly quick and inexpensive. However, if you have serious medical issues or chronic medical problems, these are probably not your best option. ° °No Primary Care Doctor: °- Call Health Connect at  832-8000 - they can help you locate a primary care doctor that  accepts your insurance, provides certain services, etc. °- Physician Referral Service- 1-800-533-3463 ° °Chronic Pain Problems: °Organization         Address  Phone   Notes  °[facility]  ([phone]) [phone] Patients need to be referred by their primary care doctor.  ° °Medication  Assistance: °Organization         Address  Phone   Notes  °[place] [facility] [address] °[place], [place] [address] (336) 641-8030 --Must be a resident of [place] °-- Must have NO insurance coverage whatsoever (no Medicaid/ Medicare, etc.) °-- The pt. MUST have a primary care doctor that directs their care regularly and follows them in the community °  °[facility]  (866) 331-1348   °[employer]  (888) 892-1162   ° °Agencies that provide inexpensive medical care: °Organization         Address  Phone   Notes  °[facility]  (336) 832-8035   °[facility]    (336) 832-7272   °[facility] [address] °[place], [place] [address] (336) 832-4777   °[facility] [address], °[place] (336) 271-4999   °[facility]    (336) 373-0678   °[facility]    (336) 272-1050   °[facility] ° [address], [place] Phone:  (336) 832-4444, Fax:  (336) 832-4440 Hours of Operation:  9 am - 6 pm, M-F.  Also accepts Medicaid/Medicare and self-pay.  °[facility] ° [address], [place] Phone: (336) [phone], Fax: (336) [phone]. Hours of Operation:  8:30 am - 5:30 pm, M-F.  Also accepts Medicaid and self-pay.  °[facility] [place] [address], [place] Phone: (336) 878-6027   °[facility] [address], [place], [place] (336)723-1848, Ext. 123 Mondays & Thursdays: 7-9 AM.  First 15 patients are seen on a first come, first serve basis. °  ° °Medicaid-accepting [place] Providers: ° °Organization         Address  Phone   Notes  °[facility] [address], [place] (336) 641-2100 Also accepts self-pay patients.  °[facility] [address], [place] ° (336) 856-9996   °[facility] [address], [place]  (336) 288-8857   °[facility] [address] [place] [address], [place] (336) 299-7000   °Veita Bland [address], [place]  ° (336) 373-1557 Only accepts [place] Access Medicaid patients after they have their name applied to their card.  ° °Self-Pay (no insurance) in [place]: ° °Organization         Address  Phone   Notes  °Sickle Cell Patients, [facility] [address], [place] (336) 832-1970   °[facility] [address], [place] (336) 832-4400   °[facility] ° [address], [place] (336) 992-4800   °[facility]/Dr. Osei-Bonsu ° [address] [place] [address], [place] or [address], [place] (336) [phone] Phone number for both [place] and [place] locations is the same.  °[facility] [address], [place] (336) 299-0000   °[facility] [address] [place] [address], [place] or [address] (336) 852-7530 °(336) 878-2260   °[facility] [address], [place] (336) 350-1642, phone; (336) 294-5005, fax Sees patients 1st and 3rd Saturday of every month.  Must not qualify for public or private insurance (i.e. Medicaid, Medicare, [place] Health Choice, Veterans' Benefits) • Household income should be no more than 200% of the poverty level •The clinic cannot treat you if you are pregnant or think you are pregnant • Sexually transmitted diseases are not treated at the clinic.  ° ° °Dental Care: °Organization         Address  Phone  Notes  °[place] [facility] [address], [place] (336) 641-6152 Accepts children up to age 21 who are enrolled in Medicaid or [place] Health Choice; pregnant women with a Medicaid card; and children who have applied for Medicaid or [place] Health Choice, but were declined, whose parents can pay a reduced fee at time of service.  °[place]  [facility] [place]  [address], [place] (336) 641-7733 Accepts children up to age 21 who are enrolled in Medicaid or [place] Health Choice; pregnant women with a Medicaid card; and children who have applied for Medicaid or [place] Health Choice, but were declined, whose parents can pay a reduced fee at time of service.  °[facility] ° [address], [place] (336) 641-4533 Patients are seen by appointment only. Walk-ins are not accepted. [facility] will see patients 18 years of age and older. °Monday - Tuesday (8am-5pm) °Most Wednesdays (8:30-5pm) °$30 per visit, cash only  °[facility] ° [address], [place] (336) 641-4533 Patients are seen by appointment only. Walk-ins are not accepted. [facility] will see patients 18 years of age and older. °One   Wednesday Evening (Monthly: Volunteer Based).  $30 per visit, cash only  °[facility]  (919) 537-3737 for adults; Children under age 4, call Graduate Pediatric Dentistry at ([phone]) [phone]. Children aged 4-14, please call ([phone]) [phone] to request a pediatric application. ° Dental services are provided in all areas of dental care including fillings, crowns and bridges, complete and partial dentures, implants, gum treatment, root canals, and extractions. Preventive care is also provided. Treatment is provided to both adults and children. °Patients are selected via a lottery and there is often a waiting list. °  °[facility] [address], °[place] ° (336) 763-8833 [email or web address] °  °[facility] [address], [place], [place] (336)723-1848, Ext. 123 Second and Fourth Thursday of each month, opens at 6:30 AM; Clinic ends at 9 AM.  Patients are seen on a first-come first-served basis, and a limited number are seen during each clinic.  ° °[facility] ° [address], [place], [place] (336) [phone]    Eligibility Requirements °You must have lived in [place], [place], or [place] counties for at least the last three months. °  You cannot be eligible for state or federal sponsored healthcare insurance, including Veterans Administration, Medicaid, or Medicare. °  You generally cannot be eligible for healthcare insurance through your employer.  °  How to apply: °Eligibility screenings are held every Tuesday and Wednesday afternoon from 1:00 pm until 4:00 pm. You do not need an appointment for the interview!  °[facility] [address], [place], [place] 336-631-2330   °[facility]  336-342-8273   °[facility]  336-703-3100   °[facility]  336-570-6415   ° °Behavioral Health Resources in the Community: °Intensive Outpatient Programs °Organization         Address  Phone  Notes  °[facility] [address], [place], [place] 336-878-6098   °[facility] [address], [place], [place] 336-832-9800   °[facility] [address], [place], [place] ° 336-882-2125   °[facility] [address],  °[place], [place] 1-800-853-5163 or 336-641-4981   °Substance Abuse Resources °Organization         Address  Phone  Notes  °[facility]  336-882-2125   °[facility]  336-784-9470   °[facility]  336-285-9073   °[facility]  336-845-3988   °Residential & Outpatient Substance Abuse Program  1-800-659-3381   °Psychological Services °Organization         Address  Phone  Notes  °[facility]  336- 832-9600   °[facility]  336- 378-7881   °[facility] [address], [place] 1-800-853-5163 or 336-641-4981   ° °Mobile Crisis Teams °Organization         Address  Phone  Notes  °[facility], Mobile Crisis Care Unit  1-877-626-1772   °Assertive °[facility] ° [address]  [place], [place] 336-834-9664   °Sharon DeEsch [address] °[place] 336-554-5454   ° °Self-Help/Support Groups °Organization         Address  Phone             Notes  °[facility]  - variety of support groups  336- 373-1402 Call for more information  °Narcotics Anonymous (NA), [facility] [address], °[place]  2 meetings at this location  ° °  Residential Treatment Programs Organization         Address  Phone  Notes  [facility] [address]    [place]  2-162-446-9507   [facility]  [address], [place] [address], [place], [place] 518-335-8251   [facility] [address] [place], [place] 898-421-0312 Admissions: 8am-3pm M-F  [facility] [address],    [place], [place] 811-886-7737   [facility] [address] [place], [place], [place] 366-815-9470   [facility] [address],  [place], [place] 761-518-3437   [facility] Intensive Outpatient [address], [place], [place] 357-897-8478   [facility] ([facility] [address]  [place], [place] 4-128-208-1388 or 364-018-5061   [facility] ([facility] [address], [place], [place] 550-158-6825 Accepts Medicaid  [facility] [address],  [place] 7-493-552-1747 Substance Abuse/Addiction Treatment   [facility] Organization         Address  Phone  Notes  [facility]  (213)776-5989   Angie Fava, PhD [address] [place], [place]   209-230-0557 or 9545239740   [facility]   [address] [place], [place] 816-093-2852   [facility] [address] [place], [place] 813-179-7686 Insurance/Medicaid/sponsorship through [facility] [address]                                    [place], [place] (302)306-1730 Therapy/tele-psych/case    [facility] [address], [place] (380)336-9283    Dr. Lolly Mustache  502-664-3355   [facility]  [employer] [facility] Dept. 1) [address] [place] [address], [place] 2) [address] [place] 3)  [address], [place] [phone]  9791780404   [facility] Child Abuse Hotline 859-854-7197 or 430-879-4621 (After Hours)      Take the prescription as directed. Take over the counter extra strength tylenol, as directed on packaging, as needed for discomfort. Apply moist heat or ice to the area(s) of discomfort, for 15 minutes at a time, several times per day for the next few days.  Do not fall asleep on a heating or ice pack. Buddy tape your toes, as instructed in the Emergency Department, until you are seen in follow up; change the dressing daily, and whenever it becomes wet or soiled. Wear a hard-soled shoe for support. Call your regular medical doctor, the Pain Management doctor, and the Podiatrist today to schedule a follow up appointment within the next week.  Return to the Emergency Department immediately if worsening.

## 2014-11-23 ENCOUNTER — Ambulatory Visit: Payer: Medicaid Other | Admitting: Family Medicine

## 2014-11-23 NOTE — Progress Notes (Signed)
[facility] ED CM called by [facility] registration [facility]) when pt came to registration window stating she was told to speak with someone at [facility] to get her medicaid changed Cm spoke with pt via telephone Confirmed [facility] Pt states "I wasn't treated right there. I still feel bad"  Cm answered pt's questions that there is not a staff a [facility] that can "change my medicaid" or get "me back my medicaid"  Reports note receiving a medicaid card Pt report speaking with DSS and being [facility].  CM educated pt that there are several [place] medicaid plans.  CM educated pt that [facility] there was difficulty verifying the type of medicaid pt has.  CM referred her to a DSS and/or Social Security office staff to verify the type of medicaid CM informed her that there is not a [facility] staff that works for [employer] or [employer] that can change her medicaid or get her back her medicaid.   Pt states she is very familiar with the the DSS and Social security offices and would go to one for assistance Pt thanked CM for speaking with her to help her understand the standard process to get medicaid, check the type of medicaid.

## 2014-12-10 ENCOUNTER — Ambulatory Visit: Payer: Medicaid Other | Admitting: Family Medicine

## 2014-12-11 ENCOUNTER — Ambulatory Visit: Payer: Medicaid Other | Admitting: Family Medicine

## 2014-12-15 ENCOUNTER — Encounter: Payer: Self-pay | Admitting: Family Medicine

## 2014-12-15 ENCOUNTER — Other Ambulatory Visit: Payer: Self-pay

## 2014-12-15 ENCOUNTER — Ambulatory Visit (INDEPENDENT_AMBULATORY_CARE_PROVIDER_SITE_OTHER): Payer: Medicaid Other | Admitting: Family Medicine

## 2014-12-15 VITALS — BP 123/57 | HR 72 | Temp 98.4°F | Resp 16 | Ht 66.0 in | Wt 131.0 lb

## 2014-12-15 DIAGNOSIS — Z0000 Encounter for general adult medical examination without abnormal findings: Secondary | ICD-10-CM

## 2014-12-15 DIAGNOSIS — G894 Chronic pain syndrome: Secondary | ICD-10-CM

## 2014-12-15 LAB — CBC WITH DIFFERENTIAL/PLATELET
Basophils Absolute: 0.1 10*3/uL (ref 0.0–0.1)
Basophils Relative: 1 % (ref 0–1)
EOS ABS: 0.3 10*3/uL (ref 0.0–0.7)
Eosinophils Relative: 5 % (ref 0–5)
HCT: 39.4 % (ref 36.0–46.0)
Hemoglobin: 13 g/dL (ref 12.0–15.0)
Lymphocytes Relative: 41 % (ref 12–46)
Lymphs Abs: 2.5 10*3/uL (ref 0.7–4.0)
MCH: 29.1 pg (ref 26.0–34.0)
MCHC: 33 g/dL (ref 30.0–36.0)
MCV: 88.1 fL (ref 78.0–100.0)
MONO ABS: 0.5 10*3/uL (ref 0.1–1.0)
MPV: 9.1 fL (ref 8.6–12.4)
Monocytes Relative: 9 % (ref 3–12)
NEUTROS ABS: 2.7 10*3/uL (ref 1.7–7.7)
NEUTROS PCT: 44 % (ref 43–77)
PLATELETS: 417 10*3/uL — AB (ref 150–400)
RBC: 4.47 MIL/uL (ref 3.87–5.11)
RDW: 13.3 % (ref 11.5–15.5)
WBC: 6.1 10*3/uL (ref 4.0–10.5)

## 2014-12-15 LAB — LIPID PANEL
CHOL/HDL RATIO: 2.6 ratio (ref <=5.0)
CHOLESTEROL: 199 mg/dL (ref 125–200)
HDL: 78 mg/dL (ref >=46)
LDL Cholesterol: 100 mg/dL (ref <130)
TRIGLYCERIDES: 105 mg/dL (ref <150)
VLDL: 21 mg/dL (ref <30)

## 2014-12-15 LAB — COMPLETE METABOLIC PANEL WITH GFR
ALK PHOS: 41 U/L (ref 33–115)
ALT: 11 U/L (ref 6–29)
AST: 16 U/L (ref 10–35)
Albumin: 4.1 g/dL (ref 3.6–5.1)
BUN: 12 mg/dL (ref 7–25)
CHLORIDE: 102 mmol/L (ref 98–110)
CO2: 25 mmol/L (ref 20–31)
CREATININE: 0.82 mg/dL (ref 0.50–1.10)
Calcium: 9.2 mg/dL (ref 8.6–10.2)
GFR, Est African American: >89 mL/min (ref >=60)
GFR, Est Non African American: 86 mL/min (ref >=60)
Glucose, Bld: 81 mg/dL (ref 65–99)
Potassium: 4 mmol/L (ref 3.5–5.3)
Sodium: 138 mmol/L (ref 135–146)
Total Bilirubin: 0.4 mg/dL (ref 0.2–1.2)
Total Protein: 7.1 g/dL (ref 6.1–8.1)

## 2014-12-15 LAB — TSH: TSH: 1.009 u[IU]/mL (ref 0.350–4.500)

## 2014-12-15 MED ORDER — ALBUTEROL SULFATE HFA 108 (90 BASE) MCG/ACT IN AERS: 2.0000 | INHALATION_SPRAY | Freq: Four times a day (QID) | RESPIRATORY_TRACT | Status: DC | PRN

## 2014-12-15 NOTE — Patient Instructions (Addendum)
Plan to come back in one month for PAP, breast exam and review of health maintenance issues. We will let you know if you need to come back sooner after review your blood work. It is important to try to quit smoking We are setting up a referral to orthopedist.

## 2014-12-15 NOTE — Progress Notes (Signed)
Patient ID: Barbara Alvarez, female   DOB: [birth year], 49 y.o.   MRN: [id number]   Barbara Alvarez, is a 49 y.o. female  MWN:[id number]  QIH:[id number]  DOB - 08/19/1965  CC: No chief complaint on file.      HPI: [name] is a 49 y.o. female here to establish care. She has a history of poor primary health care. She has a history but is not currently on medication and her BP today is within normal limits. She has a history of Bipolar Type I depression but it currently no on medication. Her major complaint today is of chronic pain in her right forefinger, left great toe and lower back. She broke her toe about a year ago and continues to have pain. She is unaware of an injury to her right forefinger but reports it being very painful, swollen and she is unable to bend normally. She would like to be referred to ortho. She has a history of asmatic bronchitis and does need her albuterol refilled today. She reports some hearing loss in her right ear. She has frequent headaches and chronic dental pain.  I have explained our job today is to collect information so that we may provide good care. She knows she will need to make an appointment for a PAP and breast exam and to review other health maintenance  Allergies  Allergen Reactions  . Haloperidol Decanoate Anaphylaxis  . Ibuprofen Shortness Of Breath  . Flexeril [Cyclobenzaprine] Other (See Comments)    Unknown  . Naproxen Other (See Comments)    Nasal Drainage  . Tramadol Swelling   Past Medical History  Diagnosis Date  . Bipolar 1 disorder   . Claustrophobia   . Bronchitis   . Hypertension   . Hearing loss in right ear   . Chronic back pain   . Chronic neck pain   . Chronic dental pain   . Headache   . Chronic pain in left foot     since great toe fx   Current Outpatient Prescriptions on File Prior to Visit  Medication Sig Dispense Refill  . albuterol (PROVENTIL HFA;VENTOLIN HFA) 108 (90 BASE) MCG/ACT inhaler Inhale 2 puffs into  the lungs every 6 (six) hours as needed for wheezing or shortness of breath. 1 Inhaler 2  . amoxicillin (AMOXIL) 875 MG tablet Take 1 tablet (875 mg total) by mouth 2 (two) times daily. 14 tablet 0  . carbamazepine (EQUETRO) 200 MG CP12 12 hr capsule Take 1 capsule (200 mg total) by mouth at bedtime. 60 each 1  . cyclobenzaprine (FLEXERIL) 10 MG tablet Take 1 tablet (10 mg total) by mouth 3 (three) times daily as needed for muscle spasms. 30 tablet 0  . diazepam (VALIUM) 5 MG tablet Take 1 tablet (5 mg total) by mouth 2 (two) times daily. 10 tablet 0  . HYDROcodone-acetaminophen (NORCO/VICODIN) 5-325 MG per tablet Take 1 tablet by mouth every 4 (four) hours as needed for moderate pain or severe pain. 6 tablet 0  . hydrOXYzine (ATARAX/VISTARIL) 25 MG tablet Take 1 tablet (25 mg total) by mouth every 6 (six) hours as needed for itching. 12 tablet 0  . hydrOXYzine (ATARAX/VISTARIL) 50 MG tablet Take 1 tablet (50 mg total) by mouth every 6 (six) hours as needed for itching. 20 tablet 0  . losartan (COZAAR) 25 MG tablet Take 1 tablet (25 mg total) by mouth daily. 30 tablet 2  . methocarbamol (ROBAXIN) 500 MG tablet Take 2 tablets (1,000 mg total) by  mouth 4 (four) times daily as needed for muscle spasms (muscle spasm/pain). 25 tablet 0  . oxyCODONE-acetaminophen (PERCOCET/ROXICET) 5-325 MG per tablet Take 1 tablet by mouth every 8 (eight) hours as needed for severe pain. 10 tablet 0  . permethrin (ELIMITE) 5 % cream Apply to affected area once from neck down. Leave on for 8 hours then wash off.  Repeat in 1 week. 60 g 0  . predniSONE (DELTASONE) 10 MG tablet 4 tabs PO QD for 4 days; 3 tabs PO QD for 3 days; 2 tabs PO QD for 2 days; 1 tab PO QD for 1 day 30 tablet 0   No current facility-administered medications on file prior to visit.   Family History  Problem Relation Age of Onset  . Cancer Mother   . Hypertension Father   . Diabetes Other    History   Social History  . Marital Status: Divorced     Spouse Name: N/A  . Number of Children: N/A  . Years of Education: N/A   Occupational History  . Not on file.   Social History Main Topics  . Smoking status: Current Every Day Smoker -- 1.00 packs/day    Types: Cigarettes  . Smokeless tobacco: Never Used  . Alcohol Use: No     Comment: states stopped drinking as New Years Resolution  . Drug Use: No  . Sexual Activity: Not on file   Other Topics Concern  . Not on file   Social History Narrative    Review of Systems: Constitutional: Negative for fever, chills. Positive for weight loss, some loss of appetite and hot flashes Skin: Negative for rashes or lesions of concern.She does complain of some itching but she thinks she might be getting bitten a her long stay motel HENT: Negative for ear pain, ear discharge.nose bleeds. Positive for decreased hearing in right ear. Eyes: Negative for pain, discharge, redness, itching and visual disturbance.Positive for difficulty reading. Neck: Negative for pain, stiffness Respiratory: Positive for some shortness of breath, couging and wheezing with asthma. Cardiovascular: Negative for chest pain, palpitations Positive for some pedal edema. Gastrointestinal: Negative for, nausea, vomiting, diarrhea. Positive for constipation and occassional lower abd pain of a non-specific. Genitourinary: Negative for dysuria, urgency, frequency, hematuria,  Musculoskeletal: Positive for back pain finger pain, toe pain. Positive for swelling of right forefinger. Negative of, gait problem.Negative for weakness. Neurological: Negative for dizziness, tremors, seizures, syncope,   light-headedness, numbness. Positive for headaches  Hematological: Negative for easy bruising but not bleeding Psychiatric/Behavioral: Positive  for depression, anxiety. Negative for decreased concentration, confusion GYN: Period becoming regular and more frequent.  She is unaware of her last PAP   Objective:  There were no vitals  filed for this visit.  Physical Exam: Constitutional: Patient appears well-developed and well-nourished. No distress. HENT: Normocephalic, atraumatic, External right and left ear normal. Oropharynx is clear and moist. Teeth in poor repair Eyes: Conjunctivae and EOM are normal. PERRLA, no scleral icterus. Neck: Normal ROM. Neck supple. No lymphadenopathy, No thyromegaly. Some tenderness of right neck muscles CVS: RRR, S1/S2 +, no murmurs, no gallops, no rubs Pulmonary: Effort and breath sounds normal, no stridor, rhonchi, wheezes. Some crackles that resolve with coughing. Abdominal: Soft. Normoactive BS,, no distension, tenderness, rebound or guarding. Left great toe is tender to palpation. No back tenderness. Musculoskeletal: Normal range of motion. Her right forefinger is swollen fairly tight and is tender to touch. She is unable to bend.  Neuro: Alert.Normal muscle tone coordination. Non-focal Skin: Skin  is warm and dry. No rash noted. Not diaphoretic. No erythema. No pallor. Psychiatric: Normal mood and affect. Behavior, judgment, thought content normal.  Lab Results  Component Value Date   WBC 5.7 12/28/2012   HGB 13.3 12/28/2012   HCT 39.0 12/28/2012   MCV 86.1 12/28/2012   PLT 400 12/28/2012   Lab Results  Component Value Date   CREATININE 0.70 12/28/2012   BUN 7 12/28/2012   NA 138 12/28/2012   K 3.2* 12/28/2012   CL 103 12/28/2012   CO2 24 12/28/2012    No results found for: HGBA1C Lipid Panel  No results found for: CHOL, TRIG, HDL, CHOLHDL, VLDL, LDLCALC     Assessment and plan:   Visit to establish care -I have reviewed, self-reported history and records found in chart -I have reviewed health maintenance items and have asked her to return in one month to further address these.  Chronic musculoskeletal pain -Referral to orthopedics  Asthma -Refill of albuterol inhaler     No Follow-up on file.  The patient was given clear instructions to go to ER or  return to medical center if symptoms don't improve, worsen or new problems develop. The patient verbalized understanding. The patient was told to call to get lab results if they haven't heard anything in the next week.         12/15/2014, 1:35 PM

## 2014-12-18 LAB — VITAMIN D 1,25 DIHYDROXY
VITAMIN D 1, 25 (OH) TOTAL: 25 pg/mL (ref 18–72)
Vitamin D3 1, 25 (OH)2: 25 pg/mL

## 2014-12-31 ENCOUNTER — Ambulatory Visit: Payer: Medicaid Other | Admitting: Occupational Therapy

## 2015-01-12 ENCOUNTER — Ambulatory Visit: Payer: Medicaid Other | Admitting: Family Medicine

## 2015-01-13 ENCOUNTER — Encounter (HOSPITAL_COMMUNITY): Payer: Self-pay | Admitting: Family Medicine

## 2015-01-13 ENCOUNTER — Emergency Department (HOSPITAL_COMMUNITY)
Admission: EM | Admit: 2015-01-13 | Discharge: 2015-01-13 | Disposition: A | Discharge status: Home / Self Care | Payer: Medicaid Other | Attending: Emergency Medicine | Admitting: Emergency Medicine

## 2015-01-13 DIAGNOSIS — K088 Other specified disorders of teeth and supporting structures: Secondary | ICD-10-CM | POA: Insufficient documentation

## 2015-01-13 DIAGNOSIS — M79641 Pain in right hand: Secondary | ICD-10-CM | POA: Insufficient documentation

## 2015-01-13 DIAGNOSIS — H9191 Unspecified hearing loss, right ear: Secondary | ICD-10-CM | POA: Diagnosis not present

## 2015-01-13 DIAGNOSIS — I10 Essential (primary) hypertension: Secondary | ICD-10-CM | POA: Diagnosis not present

## 2015-01-13 DIAGNOSIS — G8929 Other chronic pain: Secondary | ICD-10-CM | POA: Insufficient documentation

## 2015-01-13 DIAGNOSIS — M79644 Pain in right finger(s): Secondary | ICD-10-CM

## 2015-01-13 DIAGNOSIS — Z79899 Other long term (current) drug therapy: Secondary | ICD-10-CM | POA: Insufficient documentation

## 2015-01-13 DIAGNOSIS — Z720 Tobacco use: Secondary | ICD-10-CM | POA: Diagnosis not present

## 2015-01-13 DIAGNOSIS — F319 Bipolar disorder, unspecified: Secondary | ICD-10-CM | POA: Insufficient documentation

## 2015-01-13 DIAGNOSIS — K0889 Other specified disorders of teeth and supporting structures: Secondary | ICD-10-CM

## 2015-01-13 NOTE — Discharge Instructions (Signed)
Please follow-up with your dentist as soon as possible for further evaluation and management, please follow-up with the orthopedist for further evaluation and management of your ongoing pain. Please use Tylenol or ibuprofen as needed for pain.

## 2015-01-13 NOTE — ED Notes (Signed)
Declined W/C at D/C and was escorted to lobby by RN. 

## 2015-01-13 NOTE — ED Notes (Signed)
Pt here for chronic dental pain and right index finger pain. sts unable to move finger.

## 2015-01-13 NOTE — ED Provider Notes (Signed)
CSN: [id number]     Arrival date & time 01/13/15  1207 History  This chart was scribed for non-physician practitioner, Eyvonne Mechanic, PA-C working with Azalia Bilis, MD by Gwenyth Ober, ED scribe. This patient was seen in room TR10C/TR10C and the patient's care was started at 12:44 PM   Chief Complaint  Patient presents with  . Dental Pain  . Hand Pain   The history is provided by the patient. No language interpreter was used.   HPI Comments: Barbara Alvarez is a 49 y.o. female with a history of chronic pain who presents to the Emergency Department complaining of constant, moderate upper left dental pain that started this morning. Pt denies eating any hard foods or injuries to her teeth. She denies a history of similar pain. Pt was seen in the ED on 5/2 for dental pain. She made an appointment with her dentist, but had to cancel her appointment because she lost her Medicaid coverage. Pt denies fever, chills, nausea and vomiting.   Pt also complains of moderate pain to her right index finger. She states swelling as an associated symptom. Her pain becomes worse with movement. Pt has tried hydrocodone, prescribed by her orthopedist, with some relief. She has run out of the medication. Pt reports onset of pain started after she got it caught in the dryer. She has been evaluated by her PCP and orthopedist 2 weeks ago for the same pain. Her orthopedist did imaging of the affected finger and diagnosed her with contracture.   Past Medical History  Diagnosis Date  . Bipolar 1 disorder   . Claustrophobia   . Bronchitis   . Hypertension   . Hearing loss in right ear   . Chronic back pain   . Chronic neck pain   . Chronic dental pain   . Headache   . Chronic pain in left foot     since great toe fx   Past Surgical History  Procedure Laterality Date  . Cholecystectomy    . Ligament repair      left arm   Family History  Problem Relation Age of Onset  . Cancer Mother   . Hypertension  Father   . Diabetes Other    Social History  Substance Use Topics  . Smoking status: Current Every Day Smoker -- 1.00 packs/day    Types: Cigarettes  . Smokeless tobacco: Never Used  . Alcohol Use: No     Comment: states stopped drinking as New Years Resolution   OB History    No data available     Review of Systems  All other systems reviewed and are negative.     Allergies  Haloperidol decanoate; Ibuprofen; Flexeril; Naproxen; and Tramadol  Home Medications   Prior to Admission medications   Medication Sig Start Date End Date Taking? Authorizing Provider  albuterol (PROVENTIL HFA;VENTOLIN HFA) 108 (90 BASE) MCG/ACT inhaler Inhale 2 puffs into the lungs every 6 (six) hours as needed for wheezing or shortness of breath. 12/15/14   Henrietta Hoover, NP  amoxicillin (AMOXIL) 875 MG tablet Take 1 tablet (875 mg total) by mouth 2 (two) times daily. Patient not taking: Reported on 12/15/2014 09/07/14   Marlon Pel, PA-C  carbamazepine (EQUETRO) 200 MG CP12 12 hr capsule Take 1 capsule (200 mg total) by mouth at bedtime. Patient not taking: Reported on 12/15/2014 09/07/14   Marlon Pel, PA-C  cyclobenzaprine (FLEXERIL) 10 MG tablet Take 1 tablet (10 mg total) by mouth 3 (three) times daily  as needed for muscle spasms. Patient not taking: Reported on 12/15/2014 12/09/13   Tommie Sams, DO  diazepam (VALIUM) 5 MG tablet Take 1 tablet (5 mg total) by mouth 2 (two) times daily. Patient not taking: Reported on 12/15/2014 05/20/14   Alvira Monday, MD  HYDROcodone-acetaminophen (NORCO/VICODIN) 5-325 MG per tablet Take 1 tablet by mouth every 4 (four) hours as needed for moderate pain or severe pain. Patient not taking: Reported on 12/15/2014 05/20/14   Alvira Monday, MD  hydrOXYzine (ATARAX/VISTARIL) 25 MG tablet Take 1 tablet (25 mg total) by mouth every 6 (six) hours as needed for itching. Patient not taking: Reported on 12/15/2014 05/20/14   Alvira Monday, MD  hydrOXYzine (ATARAX/VISTARIL) 50 MG  tablet Take 1 tablet (50 mg total) by mouth every 6 (six) hours as needed for itching. Patient not taking: Reported on 12/15/2014 06/12/14   Graylon Good, PA-C  losartan (COZAAR) 25 MG tablet Take 1 tablet (25 mg total) by mouth daily. Patient not taking: Reported on 12/15/2014 09/07/14   Marlon Pel, PA-C  methocarbamol (ROBAXIN) 500 MG tablet Take 2 tablets (1,000 mg total) by mouth 4 (four) times daily as needed for muscle spasms (muscle spasm/pain). Patient not taking: Reported on 12/15/2014 10/30/14   Samuel Jester, DO  oxyCODONE-acetaminophen (PERCOCET/ROXICET) 5-325 MG per tablet Take 1 tablet by mouth every 8 (eight) hours as needed for severe pain. Patient not taking: Reported on 12/15/2014 09/07/14   Marlon Pel, PA-C  permethrin (ELIMITE) 5 % cream Apply to affected area once from neck down. Leave on for 8 hours then wash off.  Repeat in 1 week. Patient not taking: Reported on 12/15/2014 05/20/14   Alvira Monday, MD  predniSONE (DELTASONE) 10 MG tablet 4 tabs PO QD for 4 days; 3 tabs PO QD for 3 days; 2 tabs PO QD for 2 days; 1 tab PO QD for 1 day Patient not taking: Reported on 12/15/2014 06/12/14   Graylon Good, PA-C   BP 143/55 mmHg  Pulse 64  Temp(Src) 98.3 F (36.8 C) (Oral)  Resp 16  SpO2 100%  LMP 12/07/2014 Physical Exam  Constitutional: She appears well-developed and well-nourished. No distress.  HENT:  Head: Normocephalic and atraumatic.  External inspection shows no asymmetry of jaw, no obvious swelling or deformity Neck supple with full, active ROM External palpation of mandible and maxilla non-tender No signs of infection No tenderness along gumline No post-oropharyngeal swelling or erythema Tongue normal Floor of mouth soft and non-tender Fracture to the left incisor  Eyes: Conjunctivae and EOM are normal.  Neck: Neck supple. No tracheal deviation present.  Cardiovascular: Normal rate, regular rhythm, normal heart sounds and intact distal pulses.  Exam reveals  no gallop and no friction rub.   No murmur heard. Pulmonary/Chest: Effort normal and breath sounds normal. No respiratory distress. She has no wheezes. She has no rales. She exhibits no tenderness.  Musculoskeletal:  Swelling to the DIP of the right second digit Pt is unable to flex DIP, PIP  Sensation intact Cap refill less than 3 s No obvious joint diffusion Not warm to touch No signs of infection   Skin: Skin is warm and dry.  Psychiatric: She has a normal mood and affect. Her behavior is normal.  Nursing note and vitals reviewed.   ED Course  Procedures   DIAGNOSTIC STUDIES: Oxygen Saturation is 100% on RA, normal by my interpretation.    COORDINATION OF CARE: 1:00 PM Discussed treatment plan with pt which includes follow-up with  her dentist and orthopedist. Pt agreed to plan.   Labs Review Labs Reviewed - No data to display  Imaging Review No results found.   EKG Interpretation None      MDM   Final diagnoses:  Pain, dental  Pain of finger of right hand   Labs:    Imaging:   Consults:   Therapeutics:   Discharge Meds:   Assessment/Plan: Patient presents with uncomplicated dental pain, chronic anus and the finger. She is instructed follow-up with orthopedist for chronic right hand and finger pain, dentist for dental pain. Ibuprofen or Tylenol as needed for pain. No concerning findings on exam today  I personally performed the services described in this documentation, which was scribed in my presence. The recorded information has been reviewed and is accurate.     Eyvonne Mechanic, PA-C 01/14/15 1736  Azalia Bilis, MD 01/16/15 [phone]

## 2015-01-19 ENCOUNTER — Other Ambulatory Visit (HOSPITAL_COMMUNITY)
Admission: RE | Admit: 2015-01-19 | Discharge: 2015-01-19 | Disposition: A | Discharge status: Home / Self Care | Payer: Medicaid Other | Source: Ambulatory Visit | Attending: Family Medicine | Admitting: Family Medicine

## 2015-01-19 ENCOUNTER — Ambulatory Visit (INDEPENDENT_AMBULATORY_CARE_PROVIDER_SITE_OTHER): Payer: Medicaid Other | Admitting: Family Medicine

## 2015-01-19 ENCOUNTER — Encounter: Payer: Self-pay | Admitting: Family Medicine

## 2015-01-19 VITALS — BP 118/62 | HR 71 | Temp 98.2°F | Resp 16 | Ht 66.0 in | Wt 131.0 lb

## 2015-01-19 DIAGNOSIS — Z01419 Encounter for gynecological examination (general) (routine) without abnormal findings: Secondary | ICD-10-CM | POA: Diagnosis not present

## 2015-01-19 DIAGNOSIS — Z0000 Encounter for general adult medical examination without abnormal findings: Secondary | ICD-10-CM

## 2015-01-19 DIAGNOSIS — Z23 Encounter for immunization: Secondary | ICD-10-CM

## 2015-01-19 DIAGNOSIS — F17200 Nicotine dependence, unspecified, uncomplicated: Secondary | ICD-10-CM

## 2015-01-19 LAB — POCT URINALYSIS DIP (DEVICE)
Bilirubin Urine: NEGATIVE
GLUCOSE, UA: NEGATIVE mg/dL
HGB URINE DIPSTICK: NEGATIVE
Ketones, ur: NEGATIVE mg/dL
Nitrite: NEGATIVE
PH: 6.5 (ref 5.0–8.0)
PROTEIN: NEGATIVE mg/dL
UROBILINOGEN UA: 0.2 mg/dL (ref 0.0–1.0)

## 2015-01-19 NOTE — Progress Notes (Signed)
Subjective:    Patient ID: Barbara Alvarez, female    DOB: 02-15-1966, 49 y.o.   MRN: [id number]  HPI  Barbara Alvarez, a 49 year old patient with a history of bipolar depression presents for a complete physical examination. She states that she feels well and has minimal complaints. She states that she has not had a mammogram and pap smear was greater than 3 years ago. Patient has regular menstrual periods and has had 7 live births. She is currently sexually active with one partner.  Patient has does not see dentist yearly or have yearly vision screenings. She is currently under the care of [facility] for bipolar depression. Barbara Alvarez states that she is not sexually active. She denies domestic violence in her life.    Social History   Social History  . Marital Status: Divorced    Spouse Name: N/A  . Number of Children: N/A  . Years of Education: N/A   Occupational History  . Not on file.   Social History Main Topics  . Smoking status: Current Every Day Smoker -- 1.00 packs/day    Types: Cigarettes  . Smokeless tobacco: Never Used  . Alcohol Use: No     Comment: states stopped drinking as New Years Resolution  . Drug Use: No  . Sexual Activity: Not on file   Other Topics Concern  . Not on file   Social History Narrative   Allergies  Allergen Reactions  . Haloperidol Decanoate Anaphylaxis  . Ibuprofen Shortness Of Breath  . Flexeril [Cyclobenzaprine] Other (See Comments)    Unknown  . Naproxen Other (See Comments)    Nasal Drainage  . Tramadol Swelling  There is no immunization history for the selected administration types on file for this patient. Review of Systems  Constitutional: Negative.  Negative for fatigue.  HENT: Positive for dental problem.   Eyes: Negative.   Respiratory: Positive for shortness of breath.   Cardiovascular: Negative.   Gastrointestinal: Negative.   Endocrine: Negative for polydipsia, polyphagia and  polyuria.  Genitourinary: Negative.   Musculoskeletal: Negative.   Skin: Negative.   Allergic/Immunologic: Negative for environmental allergies, food allergies and immunocompromised state.  Neurological: Negative.  Negative for dizziness and weakness.  Hematological: Negative.   Psychiatric/Behavioral: Negative.  Negative for suicidal ideas and sleep disturbance.       Objective:   Physical Exam  Constitutional: Vital signs are normal. She appears well-developed and well-nourished.  HENT:  Head: Normocephalic and atraumatic.  Right Ear: External ear and ear canal normal. No drainage, swelling or tenderness. Decreased hearing is noted.  Left Ear: Hearing, tympanic membrane, external ear and ear canal normal.  Nose: Nose normal. No mucosal edema or sinus tenderness.  Mouth/Throat: Uvula is midline, oropharynx is clear and moist and mucous membranes are normal. She does not have dentures. Abnormal dentition (broken teeth with discoloration).  Eyes: Conjunctivae, EOM and lids are normal. Pupils are equal, round, and reactive to light.  Neck: Trachea normal, normal range of motion and full passive range of motion without pain. Neck supple.  Cardiovascular: Normal rate, regular rhythm, normal heart sounds and intact distal pulses.   Pulmonary/Chest: Effort normal and breath sounds normal. She exhibits no mass, no tenderness and no bony tenderness. Right breast exhibits no inverted nipple, no mass, no nipple discharge, no skin change and no tenderness. Left breast exhibits no inverted nipple, no mass, no nipple discharge, no skin change and no tenderness. Breasts are symmetrical.  Abdominal: Normal appearance. There is no hepatosplenomegaly. There is no tenderness. There is no rebound and no CVA tenderness.  Genitourinary: Vagina normal. No breast swelling, tenderness, discharge or bleeding. No labial fusion. There is no rash, tenderness, lesion or injury on the right labia. There is no rash,  tenderness, lesion or injury on the left labia. No vaginal discharge found.  Lymphadenopathy:       Head (right side): No submental and no submandibular adenopathy present.       Head (left side): No submental and no submandibular adenopathy present.       Right cervical: No superficial cervical adenopathy present.      Left cervical: No superficial cervical adenopathy present.    She has no axillary adenopathy.  Neurological: She is alert. She has normal strength and normal reflexes. No cranial nerve deficit or sensory deficit.  Skin: Skin is warm, dry and intact. No rash noted. No cyanosis. Nails show no clubbing.  Psychiatric: She has a normal mood and affect. Her speech is normal and behavior is normal. Judgment and thought content normal. Cognition and memory are normal.       BP 118/62 mmHg  Pulse 71  Temp(Src) 98.2 F (36.8 C) (Oral)  Resp 16  Ht  (1.676 m)  Wt 131 lb (59.421 kg)  BMI 21.15 kg/m2  LMP 12/13/2014 Assessment & Plan:  1. Annual physical exam The patient is asked to make an attempt to improve diet and exercise patterns to aid in overall medical management.  Recommend routine monthly self breast examination Will send referral for mammogram Recommend exercising 3-4 times per week for 30 minutes Recommend discussing advanced directives with family, patient refused information. Reviewed labs with patient, normal findings.  - MM Digital Diagnostic Bilat; Future - POCT urinalysis dip (device) - Cytology - PAP [place] 2. Tobacco dependence Patient currently smokes 2-3 cigarettes per day. Recommend [place] Quit Now. Smoking cessation instruction/counseling given:  counseled patient on the dangers of tobacco use, advised patient to stop smoking, and reviewed strategies to maximize success  3. Need for immunization against influenza  - Flu Vaccine QUAD 36+ mos IM (Fluarix)  4. Immunization due - Pneumococcal polysaccharide vaccine 23-valent greater than or equal  to 2yo subcutaneous/IM  - MM Digital Diagnostic Bilat; Future - POCT urinalysis dip (device) - Cytology - PAP Barbara Alvarez  Massie Maroon, FNP

## 2015-01-20 DIAGNOSIS — Z0000 Encounter for general adult medical examination without abnormal findings: Secondary | ICD-10-CM | POA: Insufficient documentation

## 2015-01-21 ENCOUNTER — Telehealth: Payer: Self-pay | Admitting: Family Medicine

## 2015-01-21 LAB — CYTOLOGY - PAP

## 2015-01-21 NOTE — Telephone Encounter (Signed)
Reviewed pap. Will repeat pap smear in 3 years per guidelines.   Massie Maroon, FNP

## 2015-01-21 NOTE — Telephone Encounter (Signed)
Called no answer, no way to leave a message. Thanks!

## 2015-01-22 NOTE — Telephone Encounter (Signed)
Called no answer and no way to leave voicemail.

## 2015-01-29 ENCOUNTER — Ambulatory Visit: Payer: Medicaid Other | Admitting: Family Medicine

## 2015-02-25 ENCOUNTER — Ambulatory Visit: Payer: Medicaid Other | Admitting: Family Medicine

## 2015-04-26 ENCOUNTER — Ambulatory Visit: Payer: Medicaid Other | Admitting: Family Medicine

## 2015-04-28 ENCOUNTER — Telehealth: Payer: Self-pay | Admitting: Family Medicine

## 2015-05-17 ENCOUNTER — Ambulatory Visit: Payer: Medicaid Other | Admitting: Family Medicine

## 2015-05-19 ENCOUNTER — Other Ambulatory Visit: Payer: Self-pay | Admitting: Family Medicine

## 2015-05-19 DIAGNOSIS — Z1231 Encounter for screening mammogram for malignant neoplasm of breast: Secondary | ICD-10-CM

## 2015-06-10 ENCOUNTER — Encounter: Payer: Self-pay | Admitting: Family Medicine

## 2015-06-10 ENCOUNTER — Ambulatory Visit (INDEPENDENT_AMBULATORY_CARE_PROVIDER_SITE_OTHER): Payer: Medicaid Other | Admitting: Family Medicine

## 2015-06-10 VITALS — BP 129/77 | HR 90 | Temp 98.1°F | Resp 18 | Ht 66.0 in | Wt 133.0 lb

## 2015-06-10 DIAGNOSIS — L299 Pruritus, unspecified: Secondary | ICD-10-CM | POA: Diagnosis not present

## 2015-06-10 DIAGNOSIS — Z91048 Other nonmedicinal substance allergy status: Secondary | ICD-10-CM

## 2015-06-10 DIAGNOSIS — R300 Dysuria: Secondary | ICD-10-CM | POA: Diagnosis not present

## 2015-06-10 DIAGNOSIS — Z23 Encounter for immunization: Secondary | ICD-10-CM | POA: Diagnosis not present

## 2015-06-10 DIAGNOSIS — F17200 Nicotine dependence, unspecified, uncomplicated: Secondary | ICD-10-CM

## 2015-06-10 DIAGNOSIS — Z9109 Other allergy status, other than to drugs and biological substances: Secondary | ICD-10-CM

## 2015-06-10 LAB — POCT URINALYSIS DIP (DEVICE)
BILIRUBIN URINE: NEGATIVE
Glucose, UA: NEGATIVE mg/dL
HGB URINE DIPSTICK: NEGATIVE
KETONES UR: NEGATIVE mg/dL
NITRITE: NEGATIVE
PH: 5.5 (ref 5.0–8.0)
Protein, ur: NEGATIVE mg/dL
SPECIFIC GRAVITY, URINE: 1.01 (ref 1.005–1.030)
Urobilinogen, UA: 0.2 mg/dL (ref 0.0–1.0)

## 2015-06-10 MED ORDER — NICOTINE 14 MG/24HR TD PT24: 14.0000 mg | MEDICATED_PATCH | Freq: Every day | TRANSDERMAL | Status: DC

## 2015-06-10 MED ORDER — HYDROXYZINE HCL 25 MG PO TABS: 25.0000 mg | ORAL_TABLET | Freq: Four times a day (QID) | ORAL | Status: DC | PRN

## 2015-06-10 NOTE — Patient Instructions (Addendum)
Call [phone] to set up mammogram. Follow-up with pain management or orthopedist about your chronic pain May use hydroxyzine for itching. Most like bites are due to bed bugs.  I have put in referral to allergist. [facility] should call you. I have send prescription for nicotine patches to your drug store.  Your blood pressure is good without medication.  Recheck here in 6 months.  I am sending a urine for culture. Will let you know if you need an antibiotic.

## 2015-06-10 NOTE — Progress Notes (Signed)
Patient ID: Barbara Alvarez, female   DOB: 12/02/1965, 50 y.o.   MRN: [id number]   Barbara Alvarez, is a 50 y.o. female  [id number]  [id number]  DOB - 11/25/1965  CC:  Chief Complaint  Patient presents with  . Follow-up       HPI: Barbara Alvarez is a 50 y.o. female here with several issues. She reports her blood pressure was never called in after her last visit. She complains about not having a mammogram ordered or being offered a PAP smear. She complains of chronic pain for which she has been referred to pain management. She complains of itching and a rash. She is currently living in a motel. Her blood pressure today is within normal limits without medications. She is concerned because she get higher readings other places (drug store) etc. She did have a mammogram ordered on her last visit but never called to schedule. She did have a PAP at her last visit and the results were negative.  She is bipolar and is followed by mental health. Her medications are in her medication list and have been reviewed.She also request referral to allergist for allergy shots.   Allergies  Allergen Reactions  . Haloperidol Decanoate Anaphylaxis  . Ibuprofen Shortness Of Breath  . Flexeril [Cyclobenzaprine] Other (See Comments)    Unknown  . Naproxen Other (See Comments)    Nasal Drainage  . Tramadol Swelling   Past Medical History  Diagnosis Date  . Bipolar 1 disorder (HCC)   . Claustrophobia   . Bronchitis   . Hypertension   . Hearing loss in right ear   . Chronic back pain   . Chronic neck pain   . Chronic dental pain   . Headache   . Chronic pain in left foot     since great toe fx   Current Outpatient Prescriptions on File Prior to Visit  Medication Sig Dispense Refill  . albuterol (PROVENTIL HFA;VENTOLIN HFA) 108 (90 BASE) MCG/ACT inhaler Inhale 2 puffs into the lungs every 6 (six) hours as needed for wheezing or shortness of breath. 1 Inhaler 2  . clonazePAM (KLONOPIN) 1 MG tablet  Take 1 mg by mouth 3 (three) times daily as needed for anxiety.    . risperiDONE (RISPERDAL) 0.5 MG tablet Take 0.5 mg by mouth 2 (two) times daily.     Marland Kitchen amoxicillin (AMOXIL) 875 MG tablet Take 1 tablet (875 mg total) by mouth 2 (two) times daily. (Patient not taking: Reported on 12/15/2014) 14 tablet 0  . carbamazepine (EQUETRO) 200 MG CP12 12 hr capsule Take 1 capsule (200 mg total) by mouth at bedtime. (Patient not taking: Reported on 12/15/2014) 60 each 1  . cyclobenzaprine (FLEXERIL) 10 MG tablet Take 1 tablet (10 mg total) by mouth 3 (three) times daily as needed for muscle spasms. (Patient not taking: Reported on 12/15/2014) 30 tablet 0  . diazepam (VALIUM) 5 MG tablet Take 1 tablet (5 mg total) by mouth 2 (two) times daily. (Patient not taking: Reported on 12/15/2014) 10 tablet 0  . HYDROcodone-acetaminophen (NORCO/VICODIN) 5-325 MG per tablet Take 1 tablet by mouth every 4 (four) hours as needed for moderate pain or severe pain. (Patient not taking: Reported on 12/15/2014) 6 tablet 0  . losartan (COZAAR) 25 MG tablet Take 1 tablet (25 mg total) by mouth daily. (Patient not taking: Reported on 12/15/2014) 30 tablet 2  . methocarbamol (ROBAXIN) 500 MG tablet Take 2 tablets (1,000 mg total) by mouth 4 (four) times daily as  needed for muscle spasms (muscle spasm/pain). (Patient not taking: Reported on 12/15/2014) 25 tablet 0  . oxyCODONE-acetaminophen (PERCOCET/ROXICET) 5-325 MG per tablet Take 1 tablet by mouth every 8 (eight) hours as needed for severe pain. (Patient not taking: Reported on 12/15/2014) 10 tablet 0  . permethrin (ELIMITE) 5 % cream Apply to affected area once from neck down. Leave on for 8 hours then wash off.  Repeat in 1 week. (Patient not taking: Reported on 12/15/2014) 60 g 0   No current facility-administered medications on file prior to visit.   Family History  Problem Relation Age of Onset  . Cancer Mother   . Hypertension Father   . Diabetes Other    Social History   Social  History  . Marital Status: Divorced    Spouse Name: N/A  . Number of Children: N/A  . Years of Education: N/A   Occupational History  . Not on file.   Social History Main Topics  . Smoking status: Current Every Day Smoker -- 1.00 packs/day    Types: Cigarettes  . Smokeless tobacco: Never Used  . Alcohol Use: No     Comment: states stopped drinking as New Years Resolution  . Drug Use: No  . Sexual Activity: Not on file   Other Topics Concern  . Not on file   Social History Narrative    Review of Systems: Constitutional: Negative for fever, chills, appetite change, weight loss.Positive for fatigue, loss of appetite. Skin: Positive for scattered papular rash HENT: Negative for  ear discharge.nose bleeds. Positive for ear pain on right and perceived decreased hearing on the right.  Eyes: Negative for pain, discharge, redness, itching and visual disturbance.Reports decreased vision.  Neck: Negative for pain, stiffness Respiratory: Negative for cough, shortness of breath,   Cardiovascular: Negative for chest pain, palpitations and leg swelling. Gastrointestinal: Negative for abdominal pain, nausea, vomiting, diarrhea, constipations. Positive for occassional heartburn.  Genitourinary: Negative for  urgency, frequency, hematuria.  Positive for dysuring (buring) Musculoskeletal: Positive for chronic back pain, finger pain and foot pain. Neurological: Negative for dizziness, tremors, seizures, syncope,   light-headedness, numbness. Reports constant headache.  Hematological: Negative for easy bruising or bleeding Psychiatric/Behavioral: Positive for bipolar depression and anxiety.   Objective:   Filed Vitals:   06/10/15 0821  BP: 129/77  Pulse: 90  Temp: 98.1 F (36.7 C)  Resp: 18    Physical Exam: Constitutional: Patient appears well-developed and well-nourished. No distress. HENT: Normocephalic, atraumatic, External right and left ear normal. Oropharynx is clear and moist.   Eyes: Conjunctivae and EOM are normal. PERRLA, no scleral icterus. Neck: Normal ROM. Neck supple. No lymphadenopathy, No thyromegaly. Mouth: Abnormal dentition CVS: RRR, S1/S2 +, no murmurs, no gallops, no rubs Pulmonary: Effort and breath sounds normal, no stridor, rhonchi, wheezes, rales.  Abdominal: Soft. Normoactive BS,, no distension, tenderness, rebound or guarding.  Musculoskeletal: Normal range of motion. No edema and no tenderness.  Neuro: Alert.Normal muscle tone coordination. Non-focal Skin: Scattered papular rash with excoriation. Psychiatric: Normal mood and affect. Behavior, judgment, thought content normal.  Lab Results  Component Value Date   WBC 6.1 12/15/2014   HGB 13.0 12/15/2014   HCT 39.4 12/15/2014   MCV 88.1 12/15/2014   PLT 417* 12/15/2014   Lab Results  Component Value Date   CREATININE 0.82 12/15/2014   BUN 12 12/15/2014   NA 138 12/15/2014   K 4.0 12/15/2014   CL 102 12/15/2014   CO2 25 12/15/2014    No results found  for: HGBA1C Lipid Panel     Component Value Date/Time   CHOL 199 12/15/2014 1419   TRIG 105 12/15/2014 1419   HDL 78 12/15/2014 1419   CHOLHDL 2.6 12/15/2014 1419   VLDL 21 12/15/2014 1419   LDLCALC 100 12/15/2014 1419       Assessment and plan:   1. Dysuria  - POCT urinalysis dip (device) - Urine culture Will decide if treatment needed after culture.  2. Environmental allergies  - Ambulatory referral to Allergy  3. Itching, probably bedbugs.  - hydrOXYzine (ATARAX/VISTARIL) 25 MG tablet; Take 1 tablet (25 mg total) by mouth every 6 (six) hours as needed for itching.  Dispense: 20 tablet; Refill: 0  4. Need for prophylactic vaccination with combined diphtheria-tetanus-pertussis (DTP) vaccine  - Tdap vaccine greater than or equal to 7yo IM  5. Tobacco use disorder  - nicotine (NICODERM CQ) 14 mg/24hr patch; Place 1 patch (14 mg total) onto the skin daily.  Dispense: 28 patch; Refill: 2   Return in about 6  months (around 12/08/2015).  The patient was given clear instructions to go to ER or return to medical center if symptoms don't improve, worsen or new problems develop. The patient verbalized understanding.    Henrietta Hoover FNP  06/10/2015, 10:17 AM

## 2015-06-11 LAB — URINE CULTURE: Colony Count: 40000

## 2015-06-14 NOTE — Progress Notes (Signed)
LM to [facility] 

## 2015-06-15 DIAGNOSIS — M20011 Mallet finger of right finger(s): Secondary | ICD-10-CM | POA: Insufficient documentation

## 2015-06-28 ENCOUNTER — Telehealth: Payer: Self-pay | Admitting: *Deleted

## 2015-06-28 NOTE — Telephone Encounter (Signed)
Pt called back regarding her test results her call back number is [phone]. Pt states that insurance wont pay for medicine prescribed for gas. Pt would like another prescription  Thanks

## 2015-06-28 NOTE — Telephone Encounter (Signed)
Barbara Alvarez, Patient states medication for gas was not covered. Please advised if you can send in something different. Thanks!

## 2015-06-29 NOTE — Telephone Encounter (Signed)
I do not know what medication she is talking about.

## 2015-07-15 ENCOUNTER — Ambulatory Visit: Payer: Medicaid Other

## 2015-11-22 ENCOUNTER — Ambulatory Visit (INDEPENDENT_AMBULATORY_CARE_PROVIDER_SITE_OTHER): Payer: Medicaid Other | Admitting: Family Medicine

## 2015-11-22 ENCOUNTER — Encounter: Payer: Self-pay | Admitting: Family Medicine

## 2015-11-22 VITALS — BP 112/58 | HR 66 | Temp 98.4°F | Resp 16 | Ht 66.0 in | Wt 150.0 lb

## 2015-11-22 DIAGNOSIS — I10 Essential (primary) hypertension: Secondary | ICD-10-CM

## 2015-11-22 DIAGNOSIS — M25562 Pain in left knee: Secondary | ICD-10-CM | POA: Diagnosis not present

## 2015-11-22 DIAGNOSIS — Z114 Encounter for screening for human immunodeficiency virus [HIV]: Secondary | ICD-10-CM | POA: Diagnosis not present

## 2015-11-22 DIAGNOSIS — M25561 Pain in right knee: Secondary | ICD-10-CM

## 2015-11-22 LAB — CBC WITH DIFFERENTIAL/PLATELET
BASOS ABS: 61 {cells}/uL (ref 0–200)
BASOS PCT: 1 %
EOS ABS: 183 {cells}/uL (ref 15–500)
EOS PCT: 3 %
HCT: 39.1 % (ref 35.0–45.0)
Hemoglobin: 12.7 g/dL (ref 11.7–15.5)
LYMPHS PCT: 33 %
Lymphs Abs: 2013 cells/uL (ref 850–3900)
MCH: 29.1 pg (ref 27.0–33.0)
MCHC: 32.5 g/dL (ref 32.0–36.0)
MCV: 89.5 fL (ref 80.0–100.0)
MONOS PCT: 9 %
MPV: 8.8 fL (ref 7.5–12.5)
Monocytes Absolute: 549 cells/uL (ref 200–950)
Neutro Abs: 3294 cells/uL (ref 1500–7800)
Neutrophils Relative %: 54 %
PLATELETS: 415 10*3/uL — AB (ref 140–400)
RBC: 4.37 MIL/uL (ref 3.80–5.10)
RDW: 13.2 % (ref 11.0–15.0)
WBC: 6.1 10*3/uL (ref 3.8–10.8)

## 2015-11-22 MED ORDER — AMOXICILLIN 500 MG PO CAPS: 500.0000 mg | ORAL_CAPSULE | Freq: Three times a day (TID) | ORAL | Status: DC

## 2015-11-22 MED ORDER — CETIRIZINE HCL 10 MG PO TABS: 10.0000 mg | ORAL_TABLET | Freq: Every day | ORAL | Status: DC

## 2015-11-22 NOTE — Patient Instructions (Signed)
Cannot prescribe medication for leg pain because you are allergic. Call and make and appointment with orthopedist.

## 2015-11-23 LAB — LIPID PANEL
CHOL/HDL RATIO: 2.6 ratio (ref <=5.0)
Cholesterol: 190 mg/dL (ref 125–200)
HDL: 74 mg/dL (ref >=46)
LDL CALC: 96 mg/dL (ref <130)
TRIGLYCERIDES: 102 mg/dL (ref <150)
VLDL: 20 mg/dL (ref <30)

## 2015-11-23 LAB — COMPLETE METABOLIC PANEL WITH GFR
ALT: 10 U/L (ref 6–29)
AST: 17 U/L (ref 10–35)
Albumin: 4.1 g/dL (ref 3.6–5.1)
Alkaline Phosphatase: 44 U/L (ref 33–115)
BUN: 8 mg/dL (ref 7–25)
CHLORIDE: 107 mmol/L (ref 98–110)
CO2: 22 mmol/L (ref 20–31)
Calcium: 9 mg/dL (ref 8.6–10.2)
Creat: 0.9 mg/dL (ref 0.50–1.10)
GFR, EST AFRICAN AMERICAN: 88 mL/min (ref >=60)
GFR, EST NON AFRICAN AMERICAN: 76 mL/min (ref >=60)
Glucose, Bld: 83 mg/dL (ref 65–99)
POTASSIUM: 3.9 mmol/L (ref 3.5–5.3)
Sodium: 141 mmol/L (ref 135–146)
Total Bilirubin: 0.3 mg/dL (ref 0.2–1.2)
Total Protein: 7 g/dL (ref 6.1–8.1)

## 2015-11-23 LAB — HIV ANTIBODY (ROUTINE TESTING W REFLEX): HIV 1&2 Ab, 4th Generation: NONREACTIVE

## 2015-11-23 NOTE — Progress Notes (Signed)
Patient ID: Barbara Alvarez, female   DOB: 15-Jul-1965, 50 y.o.   MRN: [id number]   Barbara Alvarez, is a 50 y.o. female  [id number]  [id number]  DOB - 15-Jul-1965  CC:  Chief Complaint  Patient presents with  . Leg Pain    pain in both legs that starts in hips and runs down legs        HPI Barbara Alvarez is a 50 y.o. female here today with a complaint of hip and leg pain. She has been seen and treated for this by a orthopedist. She has a history of Bipolar 1 Disorder and is on Klonopin, Atarax, Risperdol. She has been prescribed carbamazepine but reports not currently taking that.  She also complains of allergie symptoms and pain and swelling around a lower front tooth. She is currently homeless, lives in a residential motel and reports bites probably from bedbugs. She has a history of hypertension. She has not been screened for HIV.  Allergies  Allergen Reactions  . Haloperidol Decanoate Anaphylaxis  . Ibuprofen Shortness Of Breath  . Flexeril [Cyclobenzaprine] Other (See Comments)    Unknown  . Naproxen Other (See Comments)    Nasal Drainage  . Tramadol Swelling   Past Medical History  Diagnosis Date  . Bipolar 1 disorder (HCC)   . Claustrophobia   . Bronchitis   . Hypertension   . Hearing loss in right ear   . Chronic back pain   . Chronic neck pain   . Chronic dental pain   . Headache   . Chronic pain in left foot     since great toe fx   Current Outpatient Prescriptions on File Prior to Visit  Medication Sig Dispense Refill  . albuterol (PROVENTIL HFA;VENTOLIN HFA) 108 (90 BASE) MCG/ACT inhaler Inhale 2 puffs into the lungs every 6 (six) hours as needed for wheezing or shortness of breath. 1 Inhaler 2  . clonazePAM (KLONOPIN) 1 MG tablet Take 1 mg by mouth 3 (three) times daily as needed for anxiety.    . hydrOXYzine (ATARAX/VISTARIL) 25 MG tablet Take 1 tablet (25 mg total) by mouth every 6 (six) hours as needed for itching. 20 tablet 0  . risperiDONE  (RISPERDAL) 0.5 MG tablet Take 0.5 mg by mouth 2 (two) times daily.     . carbamazepine (EQUETRO) 200 MG CP12 12 hr capsule Take 1 capsule (200 mg total) by mouth at bedtime. (Patient not taking: Reported on 12/15/2014) 60 each 1  . cyclobenzaprine (FLEXERIL) 10 MG tablet Take 1 tablet (10 mg total) by mouth 3 (three) times daily as needed for muscle spasms. (Patient not taking: Reported on 12/15/2014) 30 tablet 0  . diazepam (VALIUM) 5 MG tablet Take 1 tablet (5 mg total) by mouth 2 (two) times daily. (Patient not taking: Reported on 12/15/2014) 10 tablet 0  . HYDROcodone-acetaminophen (NORCO/VICODIN) 5-325 MG per tablet Take 1 tablet by mouth every 4 (four) hours as needed for moderate pain or severe pain. (Patient not taking: Reported on 12/15/2014) 6 tablet 0  . losartan (COZAAR) 25 MG tablet Take 1 tablet (25 mg total) by mouth daily. (Patient not taking: Reported on 12/15/2014) 30 tablet 2  . methocarbamol (ROBAXIN) 500 MG tablet Take 2 tablets (1,000 mg total) by mouth 4 (four) times daily as needed for muscle spasms (muscle spasm/pain). (Patient not taking: Reported on 12/15/2014) 25 tablet 0  . nicotine (NICODERM CQ) 14 mg/24hr patch Place 1 patch (14 mg total) onto the skin daily. (Patient not taking:  Reported on 11/22/2015) 28 patch 2  . oxyCODONE-acetaminophen (PERCOCET/ROXICET) 5-325 MG per tablet Take 1 tablet by mouth every 8 (eight) hours as needed for severe pain. (Patient not taking: Reported on 12/15/2014) 10 tablet 0  . permethrin (ELIMITE) 5 % cream Apply to affected area once from neck down. Leave on for 8 hours then wash off.  Repeat in 1 week. (Patient not taking: Reported on 12/15/2014) 60 g 0   No current facility-administered medications on file prior to visit.   Family History  Problem Relation Age of Onset  . Cancer Mother   . Hypertension Father   . Diabetes Other    Social History   Social History  . Marital Status: Divorced    Spouse Name: N/A  . Number of Children: N/A  .  Years of Education: N/A   Occupational History  . Not on file.   Social History Main Topics  . Smoking status: Current Every Day Smoker -- 0.25 packs/day    Types: Cigarettes  . Smokeless tobacco: Never Used  . Alcohol Use: Yes     Comment: states stopped drinking as New Years Resolution. occ  . Drug Use: No  . Sexual Activity: Not on file   Other Topics Concern  . Not on file   Social History Narrative    Review of Systems: Constitutional: Negative for fever, chills, appetite change, weight loss. Positive for fatigue Skin: Positive for insect bites. HENT: Negative for ear pain, ear discharge.nose bleeds. Positive for allergy symptoms Eyes: Negative for pain, discharge, redness, itching and visual disturbance. Neck: Negative for pain, stiffness Respiratory: Negative for cough, shortness of breath,   Cardiovascular: Negative for chest pain, palpitations and leg swelling. Gastrointestinal: Negative for abdominal pain, nausea, vomiting, diarrhea, constipations Genitourinary: Negative for dysuria, urgency, frequency, hematuria,  Musculoskeletal: Negative for back pain, joint pain, joint  swelling, and gait problem.Negative for weakness. Positive for bilateral leg pain, chronic Neurological: Negative for dizziness, tremors, seizures, syncope,   light-headedness, numbness. Positive for headaches and occasional dizziness Hematological: Negative for easy bruising or bleeding Psychiatric/Behavioral: Positive for depression, anxiety.    Objective:   Filed Vitals:   11/22/15 1025  BP: 112/58  Pulse: 66  Temp: 98.4 F (36.9 C)  Resp: 16    Physical Exam: Constitutional: Patient appears well-developed and well-nourished. No distress. HENT: Normocephalic, atraumatic, External right and left ear normal. Oropharynx is clear and moist. There is redness, swelling and tenderness around a front lower tooth Eyes: Conjunctivae and EOM are normal. PERRLA, no scleral icterus. Neck: Normal  ROM. Neck supple. No lymphadenopathy, No thyromegaly. CVS: RRR, S1/S2 +, no murmurs, no gallops, no rubs Pulmonary: Effort and breath sounds normal, no stridor, rhonchi, wheezes, rales.  Abdominal: Soft. Normoactive BS,, no distension, tenderness, rebound or guarding.  Musculoskeletal: Normal range of motion. No edema and no tenderness.  Neuro: Alert.Normal muscle tone coordination. Non-focal Skin: Skin is warm and dry. No rash noted. Not diaphoretic. No erythema. No pallor. Psychiatric: Normal mood and affect. Behavior, judgment, thought content normal.  Lab Results  Component Value Date   WBC 6.1 11/22/2015   HGB 12.7 11/22/2015   HCT 39.1 11/22/2015   MCV 89.5 11/22/2015   PLT 415* 11/22/2015   Lab Results  Component Value Date   CREATININE 0.90 11/22/2015   BUN 8 11/22/2015   NA 141 11/22/2015   K 3.9 11/22/2015   CL 107 11/22/2015   CO2 22 11/22/2015    No results found for: HGBA1C Lipid Panel  Component Value Date/Time   CHOL 190 11/22/2015 1111   TRIG 102 11/22/2015 1111   HDL 74 11/22/2015 1111   CHOLHDL 2.6 11/22/2015 1111   VLDL 20 11/22/2015 1111   LDLCALC 96 11/22/2015 1111       Assessment and plan:   1. Essential hypertension  - CBC with Differential - COMPLETE METABOLIC PANEL WITH GFR - Lipid panel  2. Screening for HIV (human immunodeficiency virus)  - HIV antibody (with reflex)  3. Arthralgia of both lower legs -meloxicam 15 mg. #30 one po q day. -Follow-up with orthopedist.    Return in about 6 months (around 05/24/2016).  The patient was given clear instructions to go to ER or return to medical center if symptoms don't improve, worsen or new problems develop. The patient verbalized understanding.    Henrietta Hoover FNP  11/23/2015, 7:34 AM

## 2016-01-12 ENCOUNTER — Emergency Department (HOSPITAL_COMMUNITY)
Admission: EM | Admit: 2016-01-12 | Discharge: 2016-01-12 | Disposition: A | Discharge status: Home / Self Care | Payer: Medicaid Other | Attending: Emergency Medicine | Admitting: Emergency Medicine

## 2016-01-12 ENCOUNTER — Encounter (HOSPITAL_COMMUNITY): Payer: Self-pay | Admitting: *Deleted

## 2016-01-12 DIAGNOSIS — F17210 Nicotine dependence, cigarettes, uncomplicated: Secondary | ICD-10-CM | POA: Insufficient documentation

## 2016-01-12 DIAGNOSIS — Z79899 Other long term (current) drug therapy: Secondary | ICD-10-CM | POA: Diagnosis not present

## 2016-01-12 DIAGNOSIS — M542 Cervicalgia: Secondary | ICD-10-CM | POA: Diagnosis present

## 2016-01-12 DIAGNOSIS — M436 Torticollis: Secondary | ICD-10-CM

## 2016-01-12 DIAGNOSIS — Z7951 Long term (current) use of inhaled steroids: Secondary | ICD-10-CM | POA: Diagnosis not present

## 2016-01-12 DIAGNOSIS — I10 Essential (primary) hypertension: Secondary | ICD-10-CM | POA: Insufficient documentation

## 2016-01-12 MED ORDER — TIZANIDINE HCL 4 MG PO TABS: 4.0000 mg | ORAL_TABLET | Freq: Four times a day (QID) | ORAL | 0 refills | Status: DC | PRN

## 2016-01-12 MED ORDER — MORPHINE SULFATE 15 MG PO TABS: 15.0000 mg | ORAL_TABLET | Freq: Four times a day (QID) | ORAL | 0 refills | Status: DC | PRN

## 2016-01-12 MED ORDER — NAPROXEN 375 MG PO TABS: 375.0000 mg | ORAL_TABLET | Freq: Two times a day (BID) | ORAL | 0 refills | Status: DC

## 2016-01-12 MED ORDER — OXYCODONE-ACETAMINOPHEN 5-325 MG PO TABS
2.0000 | ORAL_TABLET | Freq: Once | ORAL | Status: AC
  Administered 2016-01-12: 2 via ORAL
  Filled 2016-01-12: qty 2

## 2016-01-12 NOTE — ED Notes (Signed)
PT DISCHARGED. INSTRUCTIONS AND PRESCRIPTIONS GIVEN. AAOX4. PT IN NO APPARENT DISTRESS. THE OPPORTUNITY TO ASK QUESTIONS WAS PROVIDED. 

## 2016-01-12 NOTE — ED Provider Notes (Signed)
[facility]-EMERGENCY DEPT Provider Note   CSN: [id number] Arrival date & time: 01/12/16  1159  By signing my name below, I, Emmanuella Mensah, attest that this documentation has been prepared under the direction and in the presence of Arthor Captain, PA-C. Electronically Signed: Angelene Giovanni, ED Scribe. 01/12/16. 2:57 PM.   History   Chief Complaint Chief Complaint  Patient presents with  . Neck Pain    HPI Comments: Barbara Alvarez is a 50 y.o. female with a hx of chronic back pain and hypertension who presents to the Emergency Department complaining of gradually worsening moderate posterior neck pain (worse on the right) and upper back pain onset this morning when she woke up. She reports associated limited ROM of her neck secondary to pain. No alleviating factors noted. Pt has not tried any medications PTA. She denies any recent heavy lifting, injuries, or falls. No fever, chills, headache, generalized rash, or any open wounds.    Per [place] Controlled Substance Reporting System, pt has not received any controlled medication within the last 6 months.   The history is provided by the patient. No language interpreter was used.    Past Medical History:  Diagnosis Date  . Bipolar 1 disorder (HCC)   . Bronchitis   . Chronic back pain   . Chronic dental pain   . Chronic neck pain   . Chronic pain in left foot    since great toe fx  . Claustrophobia   . Headache   . Hearing loss in right ear   . Hypertension     Patient Active Problem List   Diagnosis Date Noted  . Annual physical exam 01/20/2015    Past Surgical History:  Procedure Laterality Date  . CHOLECYSTECTOMY    . LIGAMENT REPAIR     left arm    OB History    No data available       Home Medications    Prior to Admission medications   Medication Sig Start Date End Date Taking? Authorizing Provider  albuterol (PROVENTIL HFA;VENTOLIN HFA) 108 (90 BASE) MCG/ACT inhaler Inhale 2 puffs into the lungs every 6  (six) hours as needed for wheezing or shortness of breath. 12/15/14   Henrietta Hoover, NP  amoxicillin (AMOXIL) 500 MG capsule Take 1 capsule (500 mg total) by mouth 3 (three) times daily. 11/22/15   Henrietta Hoover, NP  carbamazepine (EQUETRO) 200 MG CP12 12 hr capsule Take 1 capsule (200 mg total) by mouth at bedtime. Patient not taking: Reported on 12/15/2014 09/07/14   Marlon Pel, PA-C  cetirizine (ZYRTEC) 10 MG tablet Take 1 tablet (10 mg total) by mouth daily. 11/22/15   Henrietta Hoover, NP  clonazePAM (KLONOPIN) 1 MG tablet Take 1 mg by mouth 3 (three) times daily as needed for anxiety.    Historical Provider, MD  cyclobenzaprine (FLEXERIL) 10 MG tablet Take 1 tablet (10 mg total) by mouth 3 (three) times daily as needed for muscle spasms. Patient not taking: Reported on 12/15/2014 12/09/13   Tommie Sams, DO  diazepam (VALIUM) 5 MG tablet Take 1 tablet (5 mg total) by mouth 2 (two) times daily. Patient not taking: Reported on 12/15/2014 05/20/14   Alvira Monday, MD  HYDROcodone-acetaminophen (NORCO/VICODIN) 5-325 MG per tablet Take 1 tablet by mouth every 4 (four) hours as needed for moderate pain or severe pain. Patient not taking: Reported on 12/15/2014 05/20/14   Alvira Monday, MD  hydrOXYzine (ATARAX/VISTARIL) 25 MG tablet Take 1 tablet (25 mg total)  by mouth every 6 (six) hours as needed for itching. 06/10/15   Henrietta HooverLinda C Bernhardt, NP  losartan (COZAAR) 25 MG tablet Take 1 tablet (25 mg total) by mouth daily. Patient not taking: Reported on 12/15/2014 09/07/14   Marlon Peliffany Greene, PA-C  methocarbamol (ROBAXIN) 500 MG tablet Take 2 tablets (1,000 mg total) by mouth 4 (four) times daily as needed for muscle spasms (muscle spasm/pain). Patient not taking: Reported on 12/15/2014 10/30/14   Samuel JesterKathleen McManus, DO  morphine (MSIR) 15 MG tablet Take 1-2 tablets (15-30 mg total) by mouth every 6 (six) hours as needed for severe pain. 01/12/16   Arthor CaptainAbigail Yehuda Printup, PA-C  naproxen (NAPROSYN) 375 MG tablet Take 1 tablet  (375 mg total) by mouth 2 (two) times daily. 01/12/16   Arthor CaptainAbigail Wade Sigala, PA-C  nicotine (NICODERM CQ) 14 mg/24hr patch Place 1 patch (14 mg total) onto the skin daily. Patient not taking: Reported on 11/22/2015 06/10/15   Henrietta HooverLinda C Bernhardt, NP  oxyCODONE-acetaminophen (PERCOCET/ROXICET) 5-325 MG per tablet Take 1 tablet by mouth every 8 (eight) hours as needed for severe pain. Patient not taking: Reported on 12/15/2014 09/07/14   Marlon Peliffany Greene, PA-C  permethrin (ELIMITE) 5 % cream Apply to affected area once from neck down. Leave on for 8 hours then wash off.  Repeat in 1 week. Patient not taking: Reported on 12/15/2014 05/20/14   Alvira MondayErin Schlossman, MD  risperiDONE (RISPERDAL) 0.5 MG tablet Take 0.5 mg by mouth 2 (two) times daily.     Historical Provider, MD  tiZANidine (ZANAFLEX) 4 MG tablet Take 1 tablet (4 mg total) by mouth every 6 (six) hours as needed for muscle spasms. 01/12/16   Arthor CaptainAbigail Zephaniah Enyeart, PA-C    Family History Family History  Problem Relation Age of Onset  . Cancer Mother   . Hypertension Father   . Diabetes Other     Social History Social History  Substance Use Topics  . Smoking status: Current Every Day Smoker    Packs/day: 0.25    Types: Cigarettes  . Smokeless tobacco: Never Used  . Alcohol use Yes     Comment: states stopped drinking as New Years Resolution. occ     Allergies   Haloperidol decanoate; Ibuprofen; Flexeril [cyclobenzaprine]; Naproxen; and Tramadol   Review of Systems Review of Systems  Constitutional: Negative for chills and fever.  Musculoskeletal: Positive for back pain and neck pain.  Skin: Negative for rash and wound.  Neurological: Negative for headaches.     Physical Exam Updated Vital Signs BP 137/78 (BP Location: Left Arm)   Pulse 74   Temp 98.3 F (36.8 C) (Oral)   Resp 20   Ht 5\' 6"  (1.676 m)   Wt 67.6 kg   SpO2 100%   BMI 24.05 kg/m   Physical Exam  Constitutional: She is oriented to person, place, and time. She appears  well-developed and well-nourished. No distress.  HENT:  Head: Normocephalic and atraumatic.  Eyes: Conjunctivae and EOM are normal.  Neck: Neck supple. No tracheal deviation present.  Cardiovascular: Normal rate.   Pulmonary/Chest: Effort normal. No respiratory distress.  Musculoskeletal: Normal range of motion.  Acute muscle spasm of the R trapezius.  Unable to turn neck without severe pain.  Neurological: She is alert and oriented to person, place, and time.  Skin: Skin is warm and dry.  Psychiatric: She has a normal mood and affect. Her behavior is normal.  Nursing note and vitals reviewed.    ED Treatments / Results  DIAGNOSTIC STUDIES: Oxygen Saturation  is 100% on RA, normal by my interpretation.    COORDINATION OF CARE: 2:41 PM- Pt advised of plan for treatment and pt agrees. Explained that pt's presentation does not warrant an x-ray. She will receive Percocet here. Will d/c her with Morphine, Naproxen, and Flexeril.    Procedures Procedures (including critical care time)  Medications Ordered in ED Medications  oxyCODONE-acetaminophen (PERCOCET/ROXICET) 5-325 MG per tablet 2 tablet (not administered)     Initial Impression / Assessment and Plan / ED Course  Arthor Captain, PA-C has reviewed the triage vital signs and the nursing notes.  Pertinent labs & imaging results that were available during my care of the patient were reviewed by me and considered in my medical decision making (see chart for details).  Clinical Course    Patient with acute spasm . D/c with meds below and supportive care. No neurologic sxs or injuries. Discussed return precautions.  Final Clinical Impressions(s) / ED Diagnoses   Final diagnoses:  Torticollis, acute    New Prescriptions New Prescriptions   MORPHINE (MSIR) 15 MG TABLET    Take 1-2 tablets (15-30 mg total) by mouth every 6 (six) hours as needed for severe pain.   NAPROXEN (NAPROSYN) 375 MG TABLET    Take 1 tablet (375 mg  total) by mouth 2 (two) times daily.   TIZANIDINE (ZANAFLEX) 4 MG TABLET    Take 1 tablet (4 mg total) by mouth every 6 (six) hours as needed for muscle spasms.   I personally performed the services described in this documentation, which was scribed in my presence. The recorded information has been reviewed and is accurate.      Arthor Captain, PA-C 01/12/16 1503    Tomasita Crumble, MD 01/13/16 2202

## 2016-01-12 NOTE — ED Triage Notes (Signed)
Pt complains of upper body stiffness and pain since waking up this morning. Pt denies headache or pain elsewhere.

## 2016-02-24 ENCOUNTER — Telehealth: Payer: Self-pay

## 2016-02-24 NOTE — Telephone Encounter (Signed)
Called [facility] and office notes have been faxed. Thanks!

## 2016-03-14 ENCOUNTER — Encounter (HOSPITAL_COMMUNITY): Payer: Self-pay | Admitting: Emergency Medicine

## 2016-03-14 ENCOUNTER — Emergency Department (HOSPITAL_COMMUNITY)
Admission: EM | Admit: 2016-03-14 | Discharge: 2016-03-14 | Disposition: A | Discharge status: Home / Self Care | Payer: Medicaid Other | Attending: Emergency Medicine | Admitting: Emergency Medicine

## 2016-03-14 DIAGNOSIS — Z79899 Other long term (current) drug therapy: Secondary | ICD-10-CM | POA: Diagnosis not present

## 2016-03-14 DIAGNOSIS — S8002XA Contusion of left knee, initial encounter: Secondary | ICD-10-CM | POA: Diagnosis not present

## 2016-03-14 DIAGNOSIS — I10 Essential (primary) hypertension: Secondary | ICD-10-CM | POA: Insufficient documentation

## 2016-03-14 DIAGNOSIS — K0889 Other specified disorders of teeth and supporting structures: Secondary | ICD-10-CM

## 2016-03-14 DIAGNOSIS — F17210 Nicotine dependence, cigarettes, uncomplicated: Secondary | ICD-10-CM | POA: Diagnosis not present

## 2016-03-14 DIAGNOSIS — Y939 Activity, unspecified: Secondary | ICD-10-CM | POA: Diagnosis not present

## 2016-03-14 DIAGNOSIS — Y999 Unspecified external cause status: Secondary | ICD-10-CM | POA: Diagnosis not present

## 2016-03-14 DIAGNOSIS — K047 Periapical abscess without sinus: Secondary | ICD-10-CM | POA: Insufficient documentation

## 2016-03-14 DIAGNOSIS — S8992XA Unspecified injury of left lower leg, initial encounter: Secondary | ICD-10-CM | POA: Diagnosis present

## 2016-03-14 DIAGNOSIS — W228XXA Striking against or struck by other objects, initial encounter: Secondary | ICD-10-CM | POA: Insufficient documentation

## 2016-03-14 DIAGNOSIS — Y929 Unspecified place or not applicable: Secondary | ICD-10-CM | POA: Diagnosis not present

## 2016-03-14 DIAGNOSIS — M25562 Pain in left knee: Secondary | ICD-10-CM

## 2016-03-14 MED ORDER — PENICILLIN V POTASSIUM 250 MG PO TABS
500.0000 mg | ORAL_TABLET | Freq: Once | ORAL | Status: AC
  Administered 2016-03-14: 500 mg via ORAL
  Filled 2016-03-14: qty 2

## 2016-03-14 MED ORDER — ACETAMINOPHEN 500 MG PO TABS
1000.0000 mg | ORAL_TABLET | Freq: Once | ORAL | Status: AC
  Administered 2016-03-14: 1000 mg via ORAL
  Filled 2016-03-14: qty 2

## 2016-03-14 MED ORDER — PENICILLIN V POTASSIUM 500 MG PO TABS: 500.0000 mg | ORAL_TABLET | Freq: Four times a day (QID) | ORAL | 0 refills | Status: DC

## 2016-03-14 NOTE — Discharge Instructions (Signed)
We do not have an on-call dentist at the emergency room today.  Call the number on your Medicaid card to try to establish with a local dentist accepting Medicaid.  Penicillin for the infection.  Tylenol for pain.

## 2016-03-14 NOTE — ED Triage Notes (Signed)
Pt sts dental pain and left knee pain after injury
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
## 2016-04-06 ENCOUNTER — Ambulatory Visit: Payer: Medicaid Other | Admitting: Family Medicine

## 2016-04-20 ENCOUNTER — Emergency Department (HOSPITAL_COMMUNITY)
Admission: EM | Admit: 2016-04-20 | Discharge: 2016-04-20 | Disposition: A | Discharge status: Home / Self Care | Payer: Medicaid Other | Attending: Emergency Medicine | Admitting: Emergency Medicine

## 2016-04-20 ENCOUNTER — Encounter (HOSPITAL_COMMUNITY): Payer: Self-pay | Admitting: Emergency Medicine

## 2016-04-20 ENCOUNTER — Emergency Department (HOSPITAL_COMMUNITY): Payer: Medicaid Other

## 2016-04-20 DIAGNOSIS — F17210 Nicotine dependence, cigarettes, uncomplicated: Secondary | ICD-10-CM | POA: Diagnosis not present

## 2016-04-20 DIAGNOSIS — R079 Chest pain, unspecified: Secondary | ICD-10-CM

## 2016-04-20 DIAGNOSIS — R0789 Other chest pain: Secondary | ICD-10-CM | POA: Insufficient documentation

## 2016-04-20 DIAGNOSIS — R05 Cough: Secondary | ICD-10-CM | POA: Diagnosis not present

## 2016-04-20 DIAGNOSIS — I10 Essential (primary) hypertension: Secondary | ICD-10-CM | POA: Insufficient documentation

## 2016-04-20 DIAGNOSIS — R059 Cough, unspecified: Secondary | ICD-10-CM

## 2016-04-20 DIAGNOSIS — R071 Chest pain on breathing: Secondary | ICD-10-CM | POA: Diagnosis present

## 2016-04-20 LAB — CBC
HCT: 39.5 % (ref 36.0–46.0)
HEMOGLOBIN: 13.5 g/dL (ref 12.0–15.0)
MCH: 29.2 pg (ref 26.0–34.0)
MCHC: 34.2 g/dL (ref 30.0–36.0)
MCV: 85.3 fL (ref 78.0–100.0)
PLATELETS: 376 10*3/uL (ref 150–400)
RBC: 4.63 MIL/uL (ref 3.87–5.11)
RDW: 13.3 % (ref 11.5–15.5)
WBC: 6.2 10*3/uL (ref 4.0–10.5)

## 2016-04-20 LAB — BASIC METABOLIC PANEL
Anion gap: 10 (ref 5–15)
BUN: 6 mg/dL (ref 6–20)
CHLORIDE: 105 mmol/L (ref 101–111)
CO2: 20 mmol/L — ABNORMAL LOW (ref 22–32)
Calcium: 9 mg/dL (ref 8.9–10.3)
Creatinine, Ser: 0.75 mg/dL (ref 0.44–1.00)
GFR calc Af Amer: >60 mL/min (ref >60)
GFR calc non Af Amer: >60 mL/min (ref >60)
GLUCOSE: 83 mg/dL (ref 65–99)
POTASSIUM: 3.2 mmol/L — AB (ref 3.5–5.1)
Sodium: 135 mmol/L (ref 135–145)

## 2016-04-20 LAB — D-DIMER, QUANTITATIVE: D-Dimer, Quant: 0.28 ug/mL-FEU (ref 0.00–0.50)

## 2016-04-20 LAB — I-STAT TROPONIN, ED: Troponin i, poc: 0 ng/mL (ref 0.00–0.08)

## 2016-04-20 MED ORDER — ALBUTEROL SULFATE HFA 108 (90 BASE) MCG/ACT IN AERS
2.0000 | INHALATION_SPRAY | Freq: Once | RESPIRATORY_TRACT | Status: AC
  Administered 2016-04-20: 2 via RESPIRATORY_TRACT
  Filled 2016-04-20: qty 6.7

## 2016-04-20 NOTE — Discharge Instructions (Signed)
Continue benadryl for allergy symptoms. Take inhaler 2 puffs every 4 hrs. Follow up with family doctor. Return if worsening. Try to get your house vents cleaned or get out of the house.

## 2016-04-20 NOTE — ED Provider Notes (Signed)
[facility]-EMERGENCY DEPT Provider Note   CSN: [id number] Arrival date & time: 04/20/16  1055     History   Chief Complaint Chief Complaint  Patient presents with  . Shortness of Breath  . Chest Pain    HPI Barbara Alvarez is a 50 y.o. female.  HPI Barbara Alvarez is a 50 y.o. female with history of bipolar disorder, chronic back and neck pain, chronic foot pain, states she is disabled due to chronic pain, hypertension, bronchitis, presents to emergency department complaining of cough, chest pain, shortness of breath. Patient states she has history of asthma/bronchitis, states that she has noted 2 new house where there is mold and "gas fumes coming from the events." She states that she called the gas company and they came out to her house and told her they did not find any gas leaks. She states "I know it's not the smell of gas, but it is some other type of chemical." She believes this is what's making her sick. She reports retrosternal chest pain is worse with coughing and deep breathing. She reports associated shortness of breath. She states she has been using her inhaler but states "they gave me an off brand inhaler and is too strong for me."  Past Medical History:  Diagnosis Date  . Bipolar 1 disorder (HCC)   . Bronchitis   . Chronic back pain   . Chronic dental pain   . Chronic neck pain   . Chronic pain in left foot    since great toe fx  . Claustrophobia   . Headache   . Hearing loss in right ear   . Hypertension     There are no active problems to display for this patient.   Past Surgical History:  Procedure Laterality Date  . CHOLECYSTECTOMY    . LIGAMENT REPAIR     left arm    OB History    No data available       Home Medications    Prior to Admission medications   Medication Sig Start Date End Date Taking? Authorizing Provider  albuterol (PROVENTIL HFA;VENTOLIN HFA) 108 (90 BASE) MCG/ACT inhaler Inhale 2 puffs into the lungs every 6 (six) hours as  needed for wheezing or shortness of breath. 12/15/14   Henrietta HooverLinda C Bernhardt, NP  amoxicillin (AMOXIL) 500 MG capsule Take 1 capsule (500 mg total) by mouth 3 (three) times daily. 11/22/15   Henrietta HooverLinda C Bernhardt, NP  carbamazepine (EQUETRO) 200 MG CP12 12 hr capsule Take 1 capsule (200 mg total) by mouth at bedtime. Patient not taking: Reported on 12/15/2014 09/07/14   Marlon Peliffany Greene, PA-C  cetirizine (ZYRTEC) 10 MG tablet Take 1 tablet (10 mg total) by mouth daily. 11/22/15   Henrietta HooverLinda C Bernhardt, NP  clonazePAM (KLONOPIN) 1 MG tablet Take 1 mg by mouth 3 (three) times daily as needed for anxiety.    Historical Provider, MD  cyclobenzaprine (FLEXERIL) 10 MG tablet Take 1 tablet (10 mg total) by mouth 3 (three) times daily as needed for muscle spasms. Patient not taking: Reported on 12/15/2014 12/09/13   Tommie SamsJayce G Cook, DO  diazepam (VALIUM) 5 MG tablet Take 1 tablet (5 mg total) by mouth 2 (two) times daily. Patient not taking: Reported on 12/15/2014 05/20/14   Alvira MondayErin Schlossman, MD  HYDROcodone-acetaminophen (NORCO/VICODIN) 5-325 MG per tablet Take 1 tablet by mouth every 4 (four) hours as needed for moderate pain or severe pain. Patient not taking: Reported on 12/15/2014 05/20/14   Alvira MondayErin Schlossman, MD  hydrOXYzine (ATARAX/VISTARIL) 25 MG tablet Take 1 tablet (25 mg total) by mouth every 6 (six) hours as needed for itching. 06/10/15   Henrietta Hoover, NP  losartan (COZAAR) 25 MG tablet Take 1 tablet (25 mg total) by mouth daily. Patient not taking: Reported on 12/15/2014 09/07/14   Marlon Pel, PA-C  methocarbamol (ROBAXIN) 500 MG tablet Take 2 tablets (1,000 mg total) by mouth 4 (four) times daily as needed for muscle spasms (muscle spasm/pain). Patient not taking: Reported on 12/15/2014 10/30/14   Samuel Jester, DO  morphine (MSIR) 15 MG tablet Take 1-2 tablets (15-30 mg total) by mouth every 6 (six) hours as needed for severe pain. 01/12/16   Arthor Captain, PA-C  naproxen (NAPROSYN) 375 MG tablet Take 1 tablet (375 mg total)  by mouth 2 (two) times daily. 01/12/16   Arthor Captain, PA-C  nicotine (NICODERM CQ) 14 mg/24hr patch Place 1 patch (14 mg total) onto the skin daily. Patient not taking: Reported on 11/22/2015 06/10/15   Henrietta Hoover, NP  oxyCODONE-acetaminophen (PERCOCET/ROXICET) 5-325 MG per tablet Take 1 tablet by mouth every 8 (eight) hours as needed for severe pain. Patient not taking: Reported on 12/15/2014 09/07/14   Marlon Pel, PA-C  penicillin v potassium (VEETID) 500 MG tablet Take 1 tablet (500 mg total) by mouth 4 (four) times daily. 03/14/16   Rolland Porter, MD  permethrin (ELIMITE) 5 % cream Apply to affected area once from neck down. Leave on for 8 hours then wash off.  Repeat in 1 week. Patient not taking: Reported on 12/15/2014 05/20/14   Alvira Monday, MD  risperiDONE (RISPERDAL) 0.5 MG tablet Take 0.5 mg by mouth 2 (two) times daily.     Historical Provider, MD  tiZANidine (ZANAFLEX) 4 MG tablet Take 1 tablet (4 mg total) by mouth every 6 (six) hours as needed for muscle spasms. 01/12/16   Arthor Captain, PA-C    Family History Family History  Problem Relation Age of Onset  . Cancer Mother   . Hypertension Father   . Diabetes Other     Social History Social History  Substance Use Topics  . Smoking status: Current Every Day Smoker    Packs/day: 0.25    Types: Cigarettes  . Smokeless tobacco: Never Used  . Alcohol use Yes     Comment: hasn't had drink in about 1 wk     Allergies   Haloperidol decanoate; Ibuprofen; Flexeril [cyclobenzaprine]; Naproxen; and Tramadol   Review of Systems Review of Systems  Constitutional: Negative for chills and fever.  HENT: Positive for congestion.   Respiratory: Positive for cough, chest tightness and shortness of breath.   Cardiovascular: Positive for chest pain. Negative for palpitations and leg swelling.  Gastrointestinal: Negative for abdominal pain, diarrhea, nausea and vomiting.  Genitourinary: Negative for dysuria, flank pain and pelvic  pain.  Musculoskeletal: Negative for arthralgias, myalgias, neck pain and neck stiffness.  Skin: Negative for rash.  Neurological: Negative for dizziness, weakness and headaches.  All other systems reviewed and are negative.    Physical Exam Updated Vital Signs BP 110/84   Pulse 71   Temp 98.4 F (36.9 C) (Oral)   Resp 10   Ht 5\' 7"  (1.702 m)   Wt 67.1 kg   LMP 04/18/2016   SpO2 98%   BMI 23.18 kg/m   Physical Exam  Constitutional: She is oriented to person, place, and time. She appears well-developed and well-nourished. No distress.  HENT:  Head: Normocephalic.  Right Ear: External ear  normal.  Left Ear: External ear normal.  Nose: Nose normal.  Mouth/Throat: Oropharynx is clear and moist.  Eyes: Conjunctivae are normal.  Neck: Neck supple.  Cardiovascular: Normal rate, regular rhythm and normal heart sounds.   Pulmonary/Chest: Effort normal and breath sounds normal. No respiratory distress. She has no wheezes. She has no rales.  Abdominal: Soft. Bowel sounds are normal. She exhibits no distension. There is no tenderness. There is no rebound.  Musculoskeletal: She exhibits no edema.  Neurological: She is alert and oriented to person, place, and time.  Skin: Skin is warm and dry.  Psychiatric: She has a normal mood and affect. Her behavior is normal.  Nursing note and vitals reviewed.    ED Treatments / Results  Labs (all labs ordered are listed, but only abnormal results are displayed) Labs Reviewed  BASIC METABOLIC PANEL - Abnormal; Notable for the following:       Result Value   Potassium 3.2 (*)    CO2 20 (*)    All other components within normal limits  CBC  D-DIMER, QUANTITATIVE (NOT AT [facility])  Rosezena Sensor, ED    EKG  EKG Interpretation  Date/Time:  Thursday April 20 2016 10:57:26 EST Ventricular Rate:  64 PR Interval:    QRS Duration: 100 QT Interval:  405 QTC Calculation: 418 R Axis:   67 Text Interpretation:  Sinus rhythm RSR' in V1  or V2, probably normal variant No significant change since last tracing Confirmed by Rubin Payor  MD, NATHAN [phone]) on 04/20/2016 11:03:15 AM       Radiology Dg Chest 2 View  Result Date: 04/20/2016 CLINICAL DATA:  Chest pain for 2 weeks EXAM: CHEST  2 VIEW COMPARISON:  12/09/2013 FINDINGS: Heart and mediastinal contours are within normal limits. No focal opacities or effusions. No acute bony abnormality. IMPRESSION: No active cardiopulmonary disease. Electronically Signed   By: Charlett Nose M.D.   On: 04/20/2016 11:44    Procedures Procedures (including critical care time)  Medications Ordered in ED Medications  albuterol (PROVENTIL HFA;VENTOLIN HFA) 108 (90 Base) MCG/ACT inhaler 2 puff (not administered)     Initial Impression / Assessment and Plan / ED Course  I have reviewed the triage vital signs and the nursing notes.  Pertinent labs & imaging results that were available during my care of the patient were reviewed by me and considered in my medical decision making (see chart for details).  Clinical Course    Pt in ED with chest pain that is pleuritic and worse with cough. Symptoms persistent for multiple days and intermittent for month. Pt believes it is due to the fumes from her vents in her new home. She is uanble to move out because she has no money and no where to go and house she already paid for. States land lord is not doing anything about it. Will check labs, CXR.   2:28 PM Delay in resulting in ddimer. Spoke with RN, who called lab earlier and who said they would add it on. I spoke with lab again, it was never ran, and it will be ran now.   3:00 PM Pt's d dimer negative. Doubt PE. Given persistent symptoms for several days and negative troponin, I do not think her symptoms are due to ACS. Pain is very atypical. Pt in NAD. Resting comfortably. Will dc home with allergy medications, inhaler, close follow up with pcp. Encouraged to get out of the house. Pt voiced  understanding.   Vitals:   04/20/16 1100  04/20/16 1102 04/20/16 1103 04/20/16 1115  BP: 112/73  124/73 110/84  Pulse: 63  64 71  Resp: 22  19 10   Temp:   98.4 F (36.9 C)   TempSrc:   Oral   SpO2: 99%  100% 98%  Weight:  67.1 kg    Height:  5\' 7"  (1.702 m)       Final Clinical Impressions(s) / ED Diagnoses   Final diagnoses:  Cough  Chest pain, unspecified type    New Prescriptions New Prescriptions   No medications on file     Jaynie Crumbleatyana Lillionna Nabi, PA-C 04/20/16 1511    Benjiman CoreNathan Pickering, MD 04/20/16 1552

## 2016-04-20 NOTE — ED Notes (Signed)
PT back in room  

## 2016-04-20 NOTE — ED Triage Notes (Signed)
[facility] with c/o of sob and cp. States she lives in a house with mold and "gas smell", has been having sharp aching CP x 2 wks and sob got worse this morning. Pt does have cough, yellow sputum. Denies fevers, c/o n/v and dizziness. Received 324mg  ASA by EMS. Hx of smoking and bronchitis

## 2016-05-17 ENCOUNTER — Ambulatory Visit (INDEPENDENT_AMBULATORY_CARE_PROVIDER_SITE_OTHER): Payer: Self-pay | Admitting: Orthopaedic Surgery

## 2016-05-24 ENCOUNTER — Ambulatory Visit: Payer: Medicaid Other | Admitting: Family Medicine

## 2016-05-25 ENCOUNTER — Ambulatory Visit: Payer: Medicaid Other | Admitting: Family Medicine

## 2016-05-30 ENCOUNTER — Emergency Department (HOSPITAL_COMMUNITY): Payer: Medicaid Other

## 2016-05-30 ENCOUNTER — Ambulatory Visit: Payer: Medicaid Other | Admitting: Family Medicine

## 2016-05-30 ENCOUNTER — Encounter (HOSPITAL_COMMUNITY): Payer: Self-pay | Admitting: Emergency Medicine

## 2016-05-30 ENCOUNTER — Encounter: Payer: Self-pay | Admitting: Family Medicine

## 2016-05-30 ENCOUNTER — Emergency Department (HOSPITAL_COMMUNITY)
Admission: EM | Admit: 2016-05-30 | Discharge: 2016-05-30 | Disposition: A | Discharge status: Home / Self Care | Payer: Medicaid Other | Attending: Emergency Medicine | Admitting: Emergency Medicine

## 2016-05-30 ENCOUNTER — Ambulatory Visit (HOSPITAL_COMMUNITY)
Admission: RE | Admit: 2016-05-30 | Discharge: 2016-05-30 | Disposition: A | Discharge status: Home / Self Care | Payer: Medicaid Other | Source: Ambulatory Visit | Attending: Family Medicine | Admitting: Family Medicine

## 2016-05-30 ENCOUNTER — Ambulatory Visit (INDEPENDENT_AMBULATORY_CARE_PROVIDER_SITE_OTHER): Payer: Medicaid Other | Admitting: Family Medicine

## 2016-05-30 ENCOUNTER — Other Ambulatory Visit: Payer: Self-pay | Admitting: Family Medicine

## 2016-05-30 VITALS — BP 125/63 | HR 74 | Temp 98.3°F | Resp 14 | Ht 66.0 in | Wt 151.0 lb

## 2016-05-30 DIAGNOSIS — J069 Acute upper respiratory infection, unspecified: Secondary | ICD-10-CM

## 2016-05-30 DIAGNOSIS — I10 Essential (primary) hypertension: Secondary | ICD-10-CM | POA: Diagnosis not present

## 2016-05-30 DIAGNOSIS — S4991XA Unspecified injury of right shoulder and upper arm, initial encounter: Secondary | ICD-10-CM | POA: Diagnosis not present

## 2016-05-30 DIAGNOSIS — W19XXXA Unspecified fall, initial encounter: Secondary | ICD-10-CM | POA: Insufficient documentation

## 2016-05-30 DIAGNOSIS — F319 Bipolar disorder, unspecified: Secondary | ICD-10-CM | POA: Insufficient documentation

## 2016-05-30 DIAGNOSIS — R220 Localized swelling, mass and lump, head: Secondary | ICD-10-CM

## 2016-05-30 DIAGNOSIS — M62838 Other muscle spasm: Secondary | ICD-10-CM

## 2016-05-30 DIAGNOSIS — F17210 Nicotine dependence, cigarettes, uncomplicated: Secondary | ICD-10-CM | POA: Diagnosis not present

## 2016-05-30 DIAGNOSIS — B9789 Other viral agents as the cause of diseases classified elsewhere: Secondary | ICD-10-CM

## 2016-05-30 DIAGNOSIS — Y9301 Activity, walking, marching and hiking: Secondary | ICD-10-CM | POA: Diagnosis not present

## 2016-05-30 DIAGNOSIS — R0981 Nasal congestion: Secondary | ICD-10-CM | POA: Diagnosis present

## 2016-05-30 DIAGNOSIS — L299 Pruritus, unspecified: Secondary | ICD-10-CM | POA: Diagnosis not present

## 2016-05-30 DIAGNOSIS — Z23 Encounter for immunization: Secondary | ICD-10-CM

## 2016-05-30 DIAGNOSIS — X58XXXA Exposure to other specified factors, initial encounter: Secondary | ICD-10-CM | POA: Diagnosis not present

## 2016-05-30 DIAGNOSIS — F3163 Bipolar disorder, current episode mixed, severe, without psychotic features: Secondary | ICD-10-CM

## 2016-05-30 DIAGNOSIS — M25572 Pain in left ankle and joints of left foot: Secondary | ICD-10-CM

## 2016-05-30 DIAGNOSIS — Y999 Unspecified external cause status: Secondary | ICD-10-CM | POA: Diagnosis not present

## 2016-05-30 DIAGNOSIS — Y9289 Other specified places as the place of occurrence of the external cause: Secondary | ICD-10-CM | POA: Diagnosis not present

## 2016-05-30 DIAGNOSIS — J302 Other seasonal allergic rhinitis: Secondary | ICD-10-CM | POA: Diagnosis not present

## 2016-05-30 LAB — COMPLETE METABOLIC PANEL WITH GFR
ALBUMIN: 4 g/dL (ref 3.6–5.1)
ALK PHOS: 46 U/L (ref 33–115)
ALT: 14 U/L (ref 6–29)
AST: 17 U/L (ref 10–35)
BILIRUBIN TOTAL: 0.3 mg/dL (ref 0.2–1.2)
BUN: 14 mg/dL (ref 7–25)
CALCIUM: 9.1 mg/dL (ref 8.6–10.2)
CO2: 20 mmol/L (ref 20–31)
CREATININE: 0.82 mg/dL (ref 0.50–1.10)
Chloride: 106 mmol/L (ref 98–110)
GFR, Est Non African American: 85 mL/min (ref >=60)
Glucose, Bld: 104 mg/dL — ABNORMAL HIGH (ref 65–99)
Potassium: 3.8 mmol/L (ref 3.5–5.3)
Sodium: 137 mmol/L (ref 135–146)
TOTAL PROTEIN: 7 g/dL (ref 6.1–8.1)

## 2016-05-30 LAB — CBC WITH DIFFERENTIAL/PLATELET
BASOS PCT: 1 %
Basophils Absolute: 71 cells/uL (ref 0–200)
EOS ABS: 355 {cells}/uL (ref 15–500)
EOS PCT: 5 %
HCT: 40.9 % (ref 35.0–45.0)
Hemoglobin: 13.1 g/dL (ref 11.7–15.5)
LYMPHS PCT: 47 %
Lymphs Abs: 3337 cells/uL (ref 850–3900)
MCH: 28.4 pg (ref 27.0–33.0)
MCHC: 32 g/dL (ref 32.0–36.0)
MCV: 88.7 fL (ref 80.0–100.0)
MONOS PCT: 11 %
MPV: 8.7 fL (ref 7.5–12.5)
Monocytes Absolute: 781 cells/uL (ref 200–950)
Neutro Abs: 2556 cells/uL (ref 1500–7800)
Neutrophils Relative %: 36 %
PLATELETS: 424 10*3/uL — AB (ref 140–400)
RBC: 4.61 MIL/uL (ref 3.80–5.10)
RDW: 13.1 % (ref 11.0–15.0)
WBC: 7.1 10*3/uL (ref 3.8–10.8)

## 2016-05-30 LAB — POCT URINALYSIS DIP (DEVICE)
BILIRUBIN URINE: NEGATIVE
GLUCOSE, UA: NEGATIVE mg/dL
Ketones, ur: NEGATIVE mg/dL
Leukocytes, UA: NEGATIVE
NITRITE: NEGATIVE
PH: 5.5 (ref 5.0–8.0)
PROTEIN: NEGATIVE mg/dL
Specific Gravity, Urine: <=1.005 (ref 1.005–1.030)
Urobilinogen, UA: 0.2 mg/dL (ref 0.0–1.0)

## 2016-05-30 MED ORDER — ALBUTEROL SULFATE (2.5 MG/3ML) 0.083% IN NEBU: 2.5000 mg | INHALATION_SOLUTION | Freq: Four times a day (QID) | RESPIRATORY_TRACT | 12 refills | Status: AC | PRN

## 2016-05-30 MED ORDER — LOSARTAN POTASSIUM 25 MG PO TABS: 25.0000 mg | ORAL_TABLET | Freq: Every day | ORAL | 2 refills | Status: DC

## 2016-05-30 MED ORDER — CETIRIZINE HCL 10 MG PO TABS: 10.0000 mg | ORAL_TABLET | Freq: Every day | ORAL | 11 refills | Status: DC

## 2016-05-30 MED ORDER — ALBUTEROL SULFATE HFA 108 (90 BASE) MCG/ACT IN AERS: 2.0000 | INHALATION_SPRAY | Freq: Four times a day (QID) | RESPIRATORY_TRACT | 2 refills | Status: AC | PRN

## 2016-05-30 MED ORDER — IPRATROPIUM-ALBUTEROL 0.5-2.5 (3) MG/3ML IN SOLN
3.0000 mL | Freq: Once | RESPIRATORY_TRACT | Status: AC
  Administered 2016-05-30: 3 mL via RESPIRATORY_TRACT
  Filled 2016-05-30: qty 3

## 2016-05-30 MED ORDER — DEXAMETHASONE SODIUM PHOSPHATE 4 MG/ML IJ SOLN
4.0000 mg | Freq: Once | INTRAMUSCULAR | Status: AC
  Administered 2016-05-30: 4 mg via INTRAMUSCULAR

## 2016-05-30 MED ORDER — DIPHENHYDRAMINE HCL 25 MG PO CAPS
25.0000 mg | ORAL_CAPSULE | Freq: Once | ORAL | Status: AC
  Administered 2016-05-30: 25 mg via ORAL

## 2016-05-30 MED ORDER — KETOROLAC TROMETHAMINE 60 MG/2ML IM SOLN
30.0000 mg | Freq: Once | INTRAMUSCULAR | Status: AC
  Administered 2016-05-30: 30 mg via INTRAMUSCULAR

## 2016-05-30 MED ORDER — EPINEPHRINE 0.3 MG/0.3ML IJ SOAJ: 0.3000 mg | Freq: Once | INTRAMUSCULAR | 0 refills | Status: AC

## 2016-05-30 MED ORDER — MELOXICAM 15 MG PO TABS: 15.0000 mg | ORAL_TABLET | Freq: Every day | ORAL | 0 refills | Status: DC

## 2016-05-30 MED ORDER — METHOCARBAMOL 500 MG PO TABS: 500.0000 mg | ORAL_TABLET | Freq: Three times a day (TID) | ORAL | 0 refills | Status: DC | PRN

## 2016-05-30 MED ORDER — AMLODIPINE BESYLATE 5 MG PO TABS: 5.0000 mg | ORAL_TABLET | Freq: Every day | ORAL | 3 refills | Status: DC

## 2016-05-30 NOTE — ED Triage Notes (Signed)
Patient reports she was seen at PCP this morning, given "a shot of pain medication, benadryl, and steroids" for her right shoulder pain. Reports shoulder xray was done at PCP today. C/o "tongue feeling funny" and congestion. Speaking in full sentences without difficulty. No swelling noted upon assessment. Denies chest pain and SOB. Ambulatory to triage.

## 2016-05-30 NOTE — Discharge Instructions (Signed)
Please read attached information. If you experience any new or worsening signs or symptoms please return to the emergency room for evaluation. Please follow-up with your primary care provider or specialist as discussed. Please use medication prescribed only as directed and discontinue taking if you have any concerning signs or symptoms.   °

## 2016-05-30 NOTE — ED Notes (Signed)
Unable to obtain pt discharge BP as pt is ready to leave. Pt given discharge papers and verbalizes understanding of medications and follow up.

## 2016-05-30 NOTE — ED Provider Notes (Signed)
[facility]-EMERGENCY DEPT Provider Note   CSN: [id number] Arrival date & time: 05/30/16  1115  By signing my name below, I, Majel Homer, attest that this documentation has been prepared under the direction and in the presence of [employer], PA-C . Electronically Signed: Majel Homer, Scribe. 05/30/2016. 2:24 PM.  History   Chief Complaint Chief Complaint  Patient presents with  . Nasal Congestion  . Shoulder Pain   The history is provided by the patient. No language interpreter was used.   HPI Comments: Barbara Alvarez is a 51 y.o. female with PMHx of HTN and Bipolar I Disorder, who presents to the Emergency Department complaining of gradually worsening, right shoulder pain and right ankle pain s/p a fall that occurred 2 days ago. Pt reports she was walking on her driveway when she suddenly "stepped on a crack," causing her to "twist" her right ankle and fall onto her right shoudler. She denies any head injury or loss of consciousness. She states she visited her PCP at [facility] this morning for her "6 week check-up" in which she recieved imaging of her right shoulder which was negative and given an injection of pain medication. She states she began to exerience "tongue swelling" soon after receiving this medication in which she was given Benadryl and steroids with relief. She notes her PCP did not obtain imaging of her right ankle even though pt complained of pain. Pt reports she began to experience a similar sensation of her "tongue feeling funny" so she visited the ED. She notes she has multiple allergies in which she usually receives "allergy shots" in her bilateral arms and is "supposed to have an Epipen at all times." She notes her PCP this morning refused to write her a prescription for these medications and she "doesn't know why." She states she also complained of a gradually worsening, cough and left sided chest pain secondary to her cough at this visit but was told "her  lungs were fine." Pt denies any other complaints.   Past Medical History:  Diagnosis Date  . Bipolar 1 disorder (HCC)   . Bronchitis   . Chronic back pain   . Chronic dental pain   . Chronic neck pain   . Chronic pain in left foot    since great toe fx  . Claustrophobia   . Headache   . Hearing loss in right ear   . Hypertension    Patient Active Problem List   Diagnosis Date Noted  . Essential hypertension 05/30/2016  . Right shoulder injury, initial encounter 05/30/2016  . Seasonal allergic rhinitis 05/30/2016  . Muscle spasm of right shoulder 05/30/2016  . Need for immunization against influenza 05/30/2016  . Bipolar disorder (HCC) 05/30/2016  . Acquired mallet finger of right hand 06/15/2015   Past Surgical History:  Procedure Laterality Date  . CHOLECYSTECTOMY    . LIGAMENT REPAIR     left arm    OB History    No data available     Home Medications    Prior to Admission medications   Medication Sig Start Date End Date Taking? Authorizing Provider  albuterol (PROVENTIL HFA;VENTOLIN HFA) 108 (90 Base) MCG/ACT inhaler Inhale 2 puffs into the lungs every 6 (six) hours as needed for wheezing or shortness of breath. 05/30/16   Eyvonne Mechanic, PA-C  albuterol (PROVENTIL) (2.5 MG/3ML) 0.083% nebulizer solution Take 3 mLs (2.5 mg total) by nebulization every 6 (six) hours as needed for wheezing or shortness of breath.  05/30/16   Eyvonne Mechanic, PA-C  amLODipine (NORVASC) 5 MG tablet Take 1 tablet (5 mg total) by mouth daily. 05/30/16   Massie Maroon, FNP  carbamazepine (EQUETRO) 200 MG CP12 12 hr capsule Take 1 capsule (200 mg total) by mouth at bedtime. Patient not taking: Reported on 12/15/2014 09/07/14   Marlon Pel, PA-C  cetirizine (ZYRTEC) 10 MG tablet Take 1 tablet (10 mg total) by mouth daily. 05/30/16   Massie Maroon, FNP  clonazePAM (KLONOPIN) 1 MG tablet Take 1 mg by mouth 3 (three) times daily as needed for anxiety.    Historical Provider, MD  diazepam  (VALIUM) 5 MG tablet Take 1 tablet (5 mg total) by mouth 2 (two) times daily. Patient not taking: Reported on 12/15/2014 05/20/14   Alvira Monday, MD  EPINEPHrine (EPIPEN 2-PAK) 0.3 mg/0.3 mL IJ SOAJ injection Inject 0.3 mLs (0.3 mg total) into the muscle once. 05/30/16 05/30/16  Eyvonne Mechanic, PA-C  hydrOXYzine (ATARAX/VISTARIL) 25 MG tablet Take 1 tablet (25 mg total) by mouth every 6 (six) hours as needed for itching. Patient not taking: Reported on 05/30/2016 06/10/15   Henrietta Hoover, NP  meloxicam (MOBIC) 15 MG tablet Take 1 tablet (15 mg total) by mouth daily. 05/30/16   Massie Maroon, FNP  methocarbamol (ROBAXIN) 500 MG tablet Take 1 tablet (500 mg total) by mouth every 8 (eight) hours as needed for muscle spasms (muscle spasm/pain). 05/30/16   Massie Maroon, FNP  nicotine (NICODERM CQ) 14 mg/24hr patch Place 1 patch (14 mg total) onto the skin daily. Patient not taking: Reported on 11/22/2015 06/10/15   Henrietta Hoover, NP  permethrin (ELIMITE) 5 % cream Apply to affected area once from neck down. Leave on for 8 hours then wash off.  Repeat in 1 week. Patient not taking: Reported on 12/15/2014 05/20/14   Alvira Monday, MD  risperiDONE (RISPERDAL) 0.5 MG tablet Take 0.5 mg by mouth 2 (two) times daily.     Historical Provider, MD    Family History Family History  Problem Relation Age of Onset  . Cancer Mother   . Hypertension Father   . Diabetes Other     Social History Social History  Substance Use Topics  . Smoking status: Current Every Day Smoker    Packs/day: 0.25    Types: Cigarettes  . Smokeless tobacco: Never Used  . Alcohol use Yes     Comment: hasn't had drink in about 1 wk     Allergies   Haloperidol decanoate; Ibuprofen; Flexeril [cyclobenzaprine]; Naproxen; and Tramadol   Review of Systems Review of Systems  Respiratory: Positive for cough.   Cardiovascular: Positive for chest pain (secondary to cough).  Musculoskeletal: Positive for arthralgias.    Physical Exam Updated Vital Signs BP 129/91 (BP Location: Left Arm)   Pulse 88   Temp 98.1 F (36.7 C) (Oral)   Resp 20   Ht 5\' 6"  (1.676 m)   Wt 151 lb (68.5 kg)   LMP 05/29/2016   SpO2 100%   BMI 24.37 kg/m   Physical Exam  Constitutional: She is oriented to person, place, and time. She appears well-developed and well-nourished. No distress.  HENT:  Head: Normocephalic and atraumatic.  Eyes: EOM are normal.  Neck: Normal range of motion.  Cardiovascular: Normal rate, regular rhythm and normal heart sounds.   Pulmonary/Chest: Effort normal.  Crackles present on left side  Abdominal: Soft. She exhibits no distension. There is no tenderness.  Musculoskeletal: Normal range of motion.  She exhibits tenderness.  Right ankle is TTP on the medial lateral joint line. No obvious deformity. No TTP of the foot. No medial or malleolar tenderness.   Neurological: She is alert and oriented to person, place, and time.  Skin: Skin is warm and dry.  Psychiatric: She has a normal mood and affect. Judgment normal.  Nursing note and vitals reviewed.  ED Treatments / Results  DIAGNOSTIC STUDIES:  Oxygen Saturation is 100% on RA, normal by my interpretation.    COORDINATION OF CARE:  2:01 PM Discussed treatment plan with pt at bedside and pt agreed to plan.  Radiology Dg Chest 2 View  Result Date: 05/30/2016 CLINICAL DATA:  Cough, right shoulder pain EXAM: CHEST  2 VIEW COMPARISON:  04/20/2016 FINDINGS: Lungs are clear.  No pleural effusion or pneumothorax. The heart is normal in size. Visualized osseous structures are within normal limits. Cholecystectomy clips. IMPRESSION: Normal chest radiographs. Electronically Signed   By: Charline Bills M.D.   On: 05/30/2016 14:36   Dg Shoulder Right  Result Date: 05/30/2016 CLINICAL DATA:  Right shoulder injury. Fall against wall 05/28/2016. EXAM: RIGHT SHOULDER - 2+ VIEW COMPARISON:  None. FINDINGS: There is no evidence of fracture or  dislocation. There is no evidence of arthropathy or other focal bone abnormality. Soft tissues are unremarkable. IMPRESSION: Negative. Electronically Signed   By: Charlett Nose M.D.   On: 05/30/2016 11:19   Dg Ankle 2 Views Right  Result Date: 05/30/2016 CLINICAL DATA:  RIGHT ankle pain, twisted injury May 28, 2016. EXAM: RIGHT ANKLE - 2 VIEW COMPARISON:  RIGHT ankle radiograph February 22, 2014 FINDINGS: No fracture deformity nor dislocation. The ankle mortise appears congruent and the tibiofibular syndesmosis intact. No destructive bony lesions. Soft tissue planes are non-suspicious. IMPRESSION: Negative. Electronically Signed   By: Awilda Metro M.D.   On: 05/30/2016 14:38   Procedures Procedures (including critical care time)  Initial Impression / Assessment and Plan / ED Course  I have reviewed the triage vital signs and the nursing notes.  Pertinent labs & imaging results that were available during my care of the patient were reviewed by me and considered in my medical decision making (see chart for details).  Labs:   Imaging: DG ankle and DG chest   Consults:   Therapeutics: DuoNeb  Discharge Meds:  Assessment/Plan:   51 year old female presents today with numerous complaints. Patient with likely viral URI. She has normal plain films, afebrile in no acute respiratory distress. Patient has no signs of anaphylactic reaction here today. Patient also complaining of ankle pain, negative plain films, no signs of trauma, no swelling. Patient discharged home with symptomatic care instructions, primary care follow-up and return precautions. She verbalized understanding and agreement today's plan had no further questions or concerns  I personally performed the services described in this documentation, which was scribed in my presence. The recorded information has been reviewed and is accurate.   Final Clinical Impressions(s) / ED Diagnoses   Final diagnoses:  Viral URI with cough   Acute left ankle pain    New Prescriptions Discharge Medication List as of 05/30/2016  3:42 PM    START taking these medications   Details  albuterol (PROVENTIL) (2.5 MG/3ML) 0.083% nebulizer solution Take 3 mLs (2.5 mg total) by nebulization every 6 (six) hours as needed for wheezing or shortness of breath., Starting Tue 05/30/2016, Print    amLODipine (NORVASC) 5 MG tablet Take 1 tablet (5 mg total) by mouth daily., Starting Tue 05/30/2016, Normal  Eyvonne MechanicJeffrey Allis Quirarte, PA-C 05/30/16 1750    Lyndal Pulleyaniel Knott, MD 05/30/16 2256

## 2016-05-30 NOTE — Progress Notes (Signed)
Subjective:    Patient ID: Barbara Alvarez, female    DOB: 10-Apr-1966, 51 y.o.   MRN: [id number]  Hypertension  This is a chronic problem. The current episode started more than 1 year ago. Progression since onset: Patient has not been taking medication consistently. Associated symptoms include anxiety. Pertinent negatives include no blurred vision, chest pain, headaches, malaise/fatigue, neck pain, orthopnea, palpitations, peripheral edema, PND, shortness of breath or sweats. There are no associated agents to hypertension. Risk factors for coronary artery disease include diabetes mellitus. Past treatments include nothing. There are no compliance problems.  There is no history of angina, kidney disease, CAD/MI, CVA, heart failure, left ventricular hypertrophy, PVD or retinopathy. There is no history of a thyroid problem.  Shoulder Pain   The pain is present in the right shoulder. This is a new problem. The current episode started in the past 7 days (Patient ran into a pole, sustaining and injury to right arm). There has been no history of extremity trauma. The problem occurs constantly. The pain is at a severity of 8/10. The pain is moderate. Associated symptoms include a limited range of motion and stiffness. Pertinent negatives include no fever. The symptoms are aggravated by activity. She has tried acetaminophen and NSAIDS for the symptoms. The treatment provided mild relief. Family history does not include rheumatoid arthritis. There is no history of diabetes, gout, osteoarthritis or rheumatoid arthritis.    Past Medical History:  Diagnosis Date  . Bipolar 1 disorder (HCC)   . Bronchitis   . Chronic back pain   . Chronic dental pain   . Chronic neck pain   . Chronic pain in left foot    since great toe fx  . Claustrophobia   . Headache   . Hearing loss in right ear   . Hypertension    Social History   Social History  . Marital status: Divorced    Spouse name: N/A  . Number of  children: N/A  . Years of education: N/A   Occupational History  . Not on file.   Social History Main Topics  . Smoking status: Current Every Day Smoker    Packs/day: 0.25    Types: Cigarettes  . Smokeless tobacco: Never Used  . Alcohol use Yes     Comment: hasn't had drink in about 1 wk  . Drug use: No  . Sexual activity: Not on file   Other Topics Concern  . Not on file   Social History Narrative  . No narrative on file   Immunization History  Administered Date(s) Administered  . Influenza,inj,Quad PF,36+ Mos 01/19/2015, 05/30/2016  . Pneumococcal Polysaccharide-23 01/19/2015  . Tdap 06/10/2015   Review of Systems  Constitutional: Negative.  Negative for fatigue, fever, malaise/fatigue and unexpected weight change.  HENT: Negative.   Eyes: Positive for photophobia. Negative for blurred vision.  Respiratory: Negative.  Negative for shortness of breath.   Cardiovascular: Negative.  Negative for chest pain, palpitations, orthopnea and PND.  Gastrointestinal: Negative.   Endocrine: Negative for polydipsia, polyphagia and polyuria.  Genitourinary: Negative.   Musculoskeletal: Positive for myalgias (right shoulder pain) and stiffness. Negative for gout and neck pain.  Skin: Negative.   Allergic/Immunologic: Negative for immunocompromised state.  Neurological: Negative.  Negative for dizziness, facial asymmetry and headaches.  Hematological: Negative.   Psychiatric/Behavioral: Negative.        Objective:   Physical Exam  Constitutional: She appears well-developed and well-nourished.  HENT:  Head: Normocephalic and atraumatic.  Right  Ear: External ear normal.  Nose: Nose normal.  Mouth/Throat: Oropharynx is clear and moist.  Eyes: Conjunctivae and EOM are normal. Pupils are equal, round, and reactive to light.  Neck: Normal range of motion. Neck supple.  Abdominal: Soft. Bowel sounds are normal.  Musculoskeletal:       Right shoulder: She exhibits decreased range  of motion, pain and spasm.      BP 125/63 (BP Location: Left Arm, Patient Position: Sitting, Cuff Size: Normal)   Pulse 74   Temp 98.3 F (36.8 C) (Oral)   Resp 14   Ht 5\' 6"  (1.676 m)   Wt 151 lb (68.5 kg)   LMP 05/29/2016   SpO2 100%   BMI 24.37 kg/m  Assessment & Plan:  1. Essential hypertension Blood pressure is at goal. Will change medication to Amlodipine due to insurance constraints.  - COMPLETE METABOLIC PANEL WITH GFR - CBC with Differential - amLODipine (NORVASC) 5 MG tablet; Take 1 tablet (5 mg total) by mouth daily.  Dispense: 30 tablet; Refill: 3  2. Right shoulder injury, initial encounter - DG Shoulder Right; Future - meloxicam (MOBIC) 15 MG tablet; Take 1 tablet (15 mg total) by mouth daily.  Dispense: 30 tablet; Refill: 0  3. Seasonal allergic rhinitis, unspecified chronicity, unspecified trigger - cetirizine (ZYRTEC) 10 MG tablet; Take 1 tablet (10 mg total) by mouth daily.  Dispense: 30 tablet; Refill: 11  4. Muscle spasm of right shoulder - methocarbamol (ROBAXIN) 500 MG tablet; Take 1 tablet (500 mg total) by mouth every 8 (eight) hours as needed for muscle spasms (muscle spasm/pain).  Dispense: 30 tablet; Refill: 0 - ketorolac (TORADOL) injection 30 mg; Inject 1 mL (30 mg total) into the muscle once.  5. Need for immunization against influenza - Flu Vaccine QUAD 36+ mos IM (Fluarix)  6. Bipolar disorder, current episode mixed, severe, without psychotic features [facility]) Patient is to follow up at [facility] as previously scheduled.   RTC: 3 months hypertension Sena Clouatre M, FNP

## 2016-07-22 ENCOUNTER — Other Ambulatory Visit: Payer: Self-pay | Admitting: Family Medicine

## 2016-07-22 DIAGNOSIS — S4991XA Unspecified injury of right shoulder and upper arm, initial encounter: Secondary | ICD-10-CM

## 2016-08-09 ENCOUNTER — Ambulatory Visit (INDEPENDENT_AMBULATORY_CARE_PROVIDER_SITE_OTHER): Payer: Medicaid Other

## 2016-08-09 ENCOUNTER — Ambulatory Visit (INDEPENDENT_AMBULATORY_CARE_PROVIDER_SITE_OTHER): Payer: Medicaid Other | Admitting: Physician Assistant

## 2016-08-09 DIAGNOSIS — M25561 Pain in right knee: Secondary | ICD-10-CM | POA: Diagnosis not present

## 2016-08-09 DIAGNOSIS — M25571 Pain in right ankle and joints of right foot: Secondary | ICD-10-CM

## 2016-08-09 NOTE — Progress Notes (Signed)
Office Visit Note   Patient: Barbara Alvarez           Date of Birth: [birth year]           MRN: [id number] Visit Date: 08/09/2016              Requested by: Henrietta Hoover, NP [address] [place], [place] [address] PCP: Concepcion Living, NP   Assessment & Plan: Visit Diagnoses:  1. Pain in right ankle and joints of right foot   2. Acute pain of right knee     Plan: I encouraged her to ice both the ankle and the knee. Weightbearing as tolerated. We've placed her in an ASO brace right ankle. She was unable to take Tylenol or any NSAIDs. In fact she states that she was unsure of what medication she was able to take. Therefore no medications were prescribed. Kircher to work on range of motion of both the knee and the ankle. Did offer cortisone injection right knee she defers. Follow-up in 2 weeks' check progress lack of.  Follow-Up Instructions: Return in about 2 weeks (around 08/23/2016).   Orders:  Orders Placed This Encounter  Procedures  . XR Ankle Complete Right  . XR Knee 1-2 Views Right   No orders of the defined types were placed in this encounter.     Procedures: No procedures performed   Clinical Data: No additional findings.   Subjective: Right ankle and right knee pain  HPI Barbara Alvarez is a  51 year old female known to Dr. Eliberto Ivory service. Comes in with a new complaint of right ankle and right leg pain since an injury on 08/02/2016. She was in the bathroom in [place] and twisted her ankle injury both her ankle and her knee she went to the ER washed and radiographs were obtained of the ankle and she was told that she had an ankle sprain she was given crutches and an Ace bandage. He is having some soreness in both shoulders that she relates doing to using the crutches to ambulate. She states she has to sleep with a pillow between her knees because of her right knee pain. She states she's been unable to straighten the knee or move the ankle since the  injury. Review of Systems See history of present illness  Objective: Vital Signs: There were no vitals taken for this visit.  Physical Exam  Constitutional: She appears well-developed and well-nourished.  Skin: She is not diaphoretic.   she is quite anxious and hyper vigilant regarding her knee and right ankle  Ortho Exam Right ankle she is unable to move the ankle actively secondary to pain. She refuses passive range of motion of the ankle. She has tenderness globally over the anterior and posterior aspect of the right ankle. Achilles is without palpable defect. Calf is supple. Unable to straighten right leg secondary to pain multiple attempts made. Unable to evaluate knee for tenderness or instability due to patient's guarding. In fact touching just over her pes anserinus  area she reached out and scratched my hand. There is no visible edema ecchymosis or erythema of the knee or ankle. Specialty Comments:  No specialty comments available.  Imaging: Xr Ankle Complete Right  Result Date: 08/09/2016 AP lateral oblique views of the right ankle to include proximal tibia: No acute fracture. Talus well located within the ankle mortise no diastases.  Xr Knee 1-2 Views Right  Result Date: 08/09/2016 Right knee AP lateral views: Total attempts of AP ORs  malrotated. No acute fractures are noted. These nonstaining films. Lateral view shows no acute fracture and well maintained right knee.    PMFS History: Patient Active Problem List   Diagnosis Date Noted  . Essential hypertension 05/30/2016  . Right shoulder injury, initial encounter 05/30/2016  . Seasonal allergic rhinitis 05/30/2016  . Muscle spasm of right shoulder 05/30/2016  . Need for immunization against influenza 05/30/2016  . Bipolar disorder (HCC) 05/30/2016  . Acquired mallet finger of right hand 06/15/2015   Past Medical History:  Diagnosis Date  . Bipolar 1 disorder (HCC)   . Bronchitis   . Chronic back pain   . Chronic  dental pain   . Chronic neck pain   . Chronic pain in left foot    since great toe fx  . Claustrophobia   . Headache   . Hearing loss in right ear   . Hypertension     Family History  Problem Relation Age of Onset  . Cancer Mother   . Hypertension Father   . Diabetes Other     Past Surgical History:  Procedure Laterality Date  . CHOLECYSTECTOMY    . LIGAMENT REPAIR     left arm   Social History   Occupational History  . Not on file.   Social History Main Topics  . Smoking status: Current Every Day Smoker    Packs/day: 0.25    Types: Cigarettes  . Smokeless tobacco: Never Used  . Alcohol use Yes     Comment: hasn't had drink in about 1 wk  . Drug use: No  . Sexual activity: Not on file

## 2016-08-14 ENCOUNTER — Telehealth (INDEPENDENT_AMBULATORY_CARE_PROVIDER_SITE_OTHER): Payer: Self-pay | Admitting: Orthopaedic Surgery

## 2016-08-14 NOTE — Telephone Encounter (Signed)
Patient called asked if Dr Magnus Ivan can prescribe for her the medication he was going to give her the day of her visit. Patient said she do not remember what the medication was. The number to contact patient is [phone]

## 2016-08-15 NOTE — Telephone Encounter (Signed)
Please advise 

## 2016-08-15 NOTE — Telephone Encounter (Signed)
Patient aware of the below message  

## 2016-08-15 NOTE — Telephone Encounter (Signed)
No medication she can have an injection in her knee

## 2016-08-19 ENCOUNTER — Emergency Department (HOSPITAL_COMMUNITY)
Admission: EM | Admit: 2016-08-19 | Discharge: 2016-08-19 | Disposition: A | Discharge status: Home / Self Care | Payer: Medicaid Other | Attending: Emergency Medicine | Admitting: Emergency Medicine

## 2016-08-19 DIAGNOSIS — M25571 Pain in right ankle and joints of right foot: Secondary | ICD-10-CM | POA: Insufficient documentation

## 2016-08-19 DIAGNOSIS — F17210 Nicotine dependence, cigarettes, uncomplicated: Secondary | ICD-10-CM | POA: Diagnosis not present

## 2016-08-19 DIAGNOSIS — K0889 Other specified disorders of teeth and supporting structures: Secondary | ICD-10-CM | POA: Diagnosis present

## 2016-08-19 DIAGNOSIS — G8929 Other chronic pain: Secondary | ICD-10-CM | POA: Diagnosis not present

## 2016-08-19 DIAGNOSIS — M25561 Pain in right knee: Secondary | ICD-10-CM | POA: Diagnosis not present

## 2016-08-19 DIAGNOSIS — I10 Essential (primary) hypertension: Secondary | ICD-10-CM | POA: Insufficient documentation

## 2016-08-19 MED ORDER — PENICILLIN V POTASSIUM 500 MG PO TABS: 500.0000 mg | ORAL_TABLET | Freq: Four times a day (QID) | ORAL | 0 refills | Status: AC

## 2016-08-19 MED ORDER — ACETAMINOPHEN 325 MG PO TABS
650.0000 mg | ORAL_TABLET | Freq: Once | ORAL | Status: AC
  Administered 2016-08-19: 650 mg via ORAL
  Filled 2016-08-19 (×2): qty 2

## 2016-08-19 NOTE — ED Provider Notes (Signed)
[facility]-EMERGENCY DEPT Provider Note   CSN: [id number] Arrival date & time: 08/19/16  1734   By signing my name below, I, Barbara Alvarez, attest that this documentation has been prepared under the direction and in the presence of non-physician practitioner, Candie Mile, PA-C. Electronically Signed: Nelwyn Alvarez, Scribe. 08/19/2016. 7:04 PM.   History   Chief Complaint Chief Complaint  Patient presents with  . Leg Pain  . Dental Pain   The history is provided by the patient. No language interpreter was used.    HPI Comments:  Barbara Alvarez is a 51 y.o. female with pmhx of chronic back, neck, dental and foot pain who presents to the Emergency Department complaining of constant, mild right leg pain onset 2-3 weeks. Pt states that she fell while on vacation and injured her leg. She went to the ED at that time, where scans showed no acute injury. Pt was dx with a sprain and given tramadol and haldol, which gave her an allergic reaction. She reports associated swelling to her knee and ankle. Pt has tried ibuprofen and cold compresses since the accident with no relief. Denies any numbness. Pt has an appointment with her orthopedist in 5 days.   Pt secondarily complains of constant, mild dental pain onset today. She describes her symptoms as a pressure-like pain localized on the left side of her mouth around her 10th and 11th tooth.  Pt notes that her tooth gives her problems intermittently since an extraction of her left central incisor (tooth #9). She denies fever, nausea, vomiting, chest pain, shortness of breath history of clots.   Per EMR: Patient has been seen here for similar complaints in the past on 03/14/2016 and is being followed by Orthopedics.   Past Medical History:  Diagnosis Date  . Bipolar 1 disorder (HCC)   . Bronchitis   . Chronic back pain   . Chronic dental pain   . Chronic neck pain   . Chronic pain in left foot    since great toe fx  . Claustrophobia   .  Headache   . Hearing loss in right ear   . Hypertension     Patient Active Problem List   Diagnosis Date Noted  . Essential hypertension 05/30/2016  . Right shoulder injury, initial encounter 05/30/2016  . Seasonal allergic rhinitis 05/30/2016  . Muscle spasm of right shoulder 05/30/2016  . Need for immunization against influenza 05/30/2016  . Bipolar disorder (HCC) 05/30/2016  . Acquired mallet finger of right hand 06/15/2015    Past Surgical History:  Procedure Laterality Date  . CHOLECYSTECTOMY    . LIGAMENT REPAIR     left arm    OB History    No data available       Home Medications    Prior to Admission medications   Medication Sig Start Date End Date Taking? Authorizing Provider  albuterol (PROVENTIL HFA;VENTOLIN HFA) 108 (90 Base) MCG/ACT inhaler Inhale 2 puffs into the lungs every 6 (six) hours as needed for wheezing or shortness of breath. 05/30/16   Eyvonne Mechanic, PA-C  albuterol (PROVENTIL) (2.5 MG/3ML) 0.083% nebulizer solution Take 3 mLs (2.5 mg total) by nebulization every 6 (six) hours as needed for wheezing or shortness of breath. 05/30/16   Eyvonne Mechanic, PA-C  amLODipine (NORVASC) 5 MG tablet Take 1 tablet (5 mg total) by mouth daily. 05/30/16   Massie Maroon, FNP  carbamazepine (EQUETRO) 200 MG CP12 12 hr capsule Take 1 capsule (200 mg total) by mouth at  bedtime. Patient not taking: Reported on 12/15/2014 09/07/14   Marlon Pel, PA-C  cetirizine (ZYRTEC) 10 MG tablet Take 1 tablet (10 mg total) by mouth daily. 05/30/16   Massie Maroon, FNP  clonazePAM (KLONOPIN) 1 MG tablet Take 1 mg by mouth 3 (three) times daily as needed for anxiety.    Historical Provider, MD  diazepam (VALIUM) 5 MG tablet Take 1 tablet (5 mg total) by mouth 2 (two) times daily. Patient not taking: Reported on 12/15/2014 05/20/14   Alvira Monday, MD  hydrOXYzine (ATARAX/VISTARIL) 25 MG tablet Take 1 tablet (25 mg total) by mouth every 6 (six) hours as needed for itching. Patient  not taking: Reported on 05/30/2016 06/10/15   Henrietta Hoover, NP  meloxicam (MOBIC) 15 MG tablet TAKE 1 TABLET BY MOUTH EVERY DAY 07/24/16   Massie Maroon, FNP  methocarbamol (ROBAXIN) 500 MG tablet Take 1 tablet (500 mg total) by mouth every 8 (eight) hours as needed for muscle spasms (muscle spasm/pain). 05/30/16   Massie Maroon, FNP  nicotine (NICODERM CQ) 14 mg/24hr patch Place 1 patch (14 mg total) onto the skin daily. Patient not taking: Reported on 11/22/2015 06/10/15   Henrietta Hoover, NP  penicillin v potassium (VEETID) 500 MG tablet Take 1 tablet (500 mg total) by mouth 4 (four) times daily. 08/19/16 08/26/16  Zulema Pulaski Manuel [place]conning, PA  permethrin (ELIMITE) 5 % cream Apply to affected area once from neck down. Leave on for 8 hours then wash off.  Repeat in 1 week. Patient not taking: Reported on 12/15/2014 05/20/14   Alvira Monday, MD  risperiDONE (RISPERDAL) 0.5 MG tablet Take 0.5 mg by mouth 2 (two) times daily.     Historical Provider, MD    Family History Family History  Problem Relation Age of Onset  . Cancer Mother   . Hypertension Father   . Diabetes Other     Social History Social History  Substance Use Topics  . Smoking status: Current Every Day Smoker    Packs/day: 0.25    Types: Cigarettes  . Smokeless tobacco: Never Used  . Alcohol use Yes     Comment: hasn't had drink in about 1 wk     Allergies   Haloperidol decanoate; Ibuprofen; Flexeril [cyclobenzaprine]; Naproxen; and Tramadol   Review of Systems Review of Systems  Constitutional: Negative for chills and fever.  HENT: Positive for dental problem.   Musculoskeletal: Positive for arthralgias and joint swelling.  Neurological: Negative for numbness.     Physical Exam Updated Vital Signs BP 116/71 (BP Location: Left Arm)   Pulse 70   Temp 97.8 F (36.6 C) (Oral)   Resp 16   Ht  (1.702 m)   Wt 70.8 kg   SpO2 98%   BMI 24.43 kg/m   Physical Exam  Constitutional: She appears  well-developed and well-nourished.  Well appearing. Patent airway. No stridor, no drooling, no trismus, no respiratory distress.  HENT:  Head: Normocephalic and atraumatic.  Nose: Nose normal.  Mouth/Throat: Oropharynx is clear and moist.  Abnormal dentition. Missing several teeth. Tender to palpation to tooth 8, 23, 24 and 25. Tender to palpation to surrounding gums, No redness, swelling or evidence of dental abscess.   Eyes: Conjunctivae and EOM are normal.  Neck: Normal range of motion.  Tenderness or Swelling to the Neck.  Cardiovascular: Normal rate and intact distal pulses.   Pulmonary/Chest: Effort normal. No respiratory distress.  Normal work of breathing. No respiratory distress noted.  Abdominal: Soft.  Musculoskeletal: Normal range of motion.  Mild tenderness to palpation to right knee and right ankle. Full ROM on active and passive ROM bilaterally. No pain with flexion or extension on active or passive ROM bilaterally. No crepitus. Negative patellar ballottement test. No effusion noted bilaterally. Negative anterior/poster drawer bilaterally. Negative Lachman's test bilaterally. No varus or valgus laxity bilaterally.  Right ankle with good range of motion, no evidence of swelling, no redness, no discoloration.   Neurological: She is alert.  Skin: Skin is warm.  Psychiatric: She has a normal mood and affect. Her behavior is normal.  Nursing note and vitals reviewed.    ED Treatments / Results  DIAGNOSTIC STUDIES:  Oxygen Saturation is 98% on RA, normal by my interpretation.    COORDINATION OF CARE:  7:37 PM Discussed treatment plan with pt at bedside which includes follow-up with orthopedics and pt agreed to plan.  Labs (all labs ordered are listed, but only abnormal results are displayed) Labs Reviewed - No data to display  EKG  EKG Interpretation None       Radiology No results found.  Procedures Procedures (including critical care time)  Medications  Ordered in ED Medications  acetaminophen (TYLENOL) tablet 650 mg (not administered)     Initial Impression / Assessment and Plan / ED Course  I have reviewed the triage vital signs and the nursing notes.  Pertinent labs & imaging results that were available during my care of the patient were reviewed by me and considered in my medical decision making (see chart for details).    Patient here with no new symptoms. She has been seen for similar symptoms, knee pain and dental pain, in the past last seen here in ED on 03/14/2016 and currently being followed by orthopedics. She has another appointment next week on April 18. Pain managed in ED with tylenol. Pt advised to follow up with orthopedics. Patient given conservative therapy recommended and discussed. Patient also with dentalgia.  No abscess requiring immediate incision and drainage.  Exam not concerning for Ludwig's angina or pharyngeal abscess.  Will treat with Peniciliin. Pt instructed to follow-up with dentist.    Patient in NAD, hemodynamically stable, afebrile. Patient will be dc home & is agreeable with above plan. Reasons to immediately return to ED discussed.   Final Clinical Impressions(s) / ED Diagnoses   Final diagnoses:  Pain, dental  Chronic pain of right knee    New Prescriptions New Prescriptions   PENICILLIN V POTASSIUM (VEETID) 500 MG TABLET    Take 1 tablet (500 mg total) by mouth 4 (four) times daily.  I personally performed the services described in this documentation, which was scribed in my presence. The recorded information has been reviewed and is accurate.    [address] [place], [place] 08/19/16 2007    Jacalyn Lefevre, MD 08/19/16 2121

## 2016-08-19 NOTE — ED Triage Notes (Signed)
Pt presents with dental pain and R leg pain. The dental pain in in her lower mouth and began this morning. She tried warm water gargles with no relief. The R leg pain began on 3/27 after she fell and sprained her foot. She followed up with ortho MD but continues to have pain and is unsure of the next steps in her plan.

## 2016-08-19 NOTE — Discharge Instructions (Signed)
Take penicillin 4 times a day for 7 days. Drink at least glasses of water throughout the day. Please use rest, ice, compression, elevation to right knee. Please go to your scheduled appointment next week with your orthopedic surgeon. Take Tylenol as needed for pain.  Get help right away if: Your knee feels warm to the touch. You cannot move your knee. You have severe pain in your knee. You have chest pain. You have trouble breathing. Contact a health care provider if: Your pain is not controlled with medicines. Your symptoms are worse. You have new symptoms. Get help right away if: You are unable to open your mouth. You are having trouble breathing or swallowing. You have a fever. Your face, neck, or jaw is swollen.

## 2016-08-23 ENCOUNTER — Ambulatory Visit (INDEPENDENT_AMBULATORY_CARE_PROVIDER_SITE_OTHER): Payer: Medicaid Other | Admitting: Physician Assistant

## 2016-09-06 ENCOUNTER — Emergency Department (HOSPITAL_COMMUNITY)
Admission: EM | Admit: 2016-09-06 | Discharge: 2016-09-06 | Disposition: A | Discharge status: Home / Self Care | Payer: Medicaid Other | Attending: Emergency Medicine | Admitting: Emergency Medicine

## 2016-09-06 ENCOUNTER — Encounter (HOSPITAL_COMMUNITY): Payer: Self-pay | Admitting: *Deleted

## 2016-09-06 ENCOUNTER — Emergency Department (HOSPITAL_COMMUNITY): Payer: Medicaid Other

## 2016-09-06 DIAGNOSIS — Z79899 Other long term (current) drug therapy: Secondary | ICD-10-CM | POA: Insufficient documentation

## 2016-09-06 DIAGNOSIS — Y92410 Unspecified street and highway as the place of occurrence of the external cause: Secondary | ICD-10-CM | POA: Diagnosis not present

## 2016-09-06 DIAGNOSIS — F17210 Nicotine dependence, cigarettes, uncomplicated: Secondary | ICD-10-CM | POA: Diagnosis not present

## 2016-09-06 DIAGNOSIS — S6991XA Unspecified injury of right wrist, hand and finger(s), initial encounter: Secondary | ICD-10-CM | POA: Insufficient documentation

## 2016-09-06 DIAGNOSIS — I10 Essential (primary) hypertension: Secondary | ICD-10-CM | POA: Insufficient documentation

## 2016-09-06 DIAGNOSIS — Y939 Activity, unspecified: Secondary | ICD-10-CM | POA: Insufficient documentation

## 2016-09-06 DIAGNOSIS — Y999 Unspecified external cause status: Secondary | ICD-10-CM | POA: Insufficient documentation

## 2016-09-06 MED ORDER — METHOCARBAMOL 500 MG PO TABS
500.0000 mg | ORAL_TABLET | Freq: Three times a day (TID) | ORAL | 0 refills | Status: DC | PRN
Start: 2016-09-06 — End: 2016-09-13

## 2016-09-06 MED ORDER — KETOROLAC TROMETHAMINE 60 MG/2ML IM SOLN
30.0000 mg | Freq: Once | INTRAMUSCULAR | Status: AC
  Administered 2016-09-06: 30 mg via INTRAMUSCULAR
  Filled 2016-09-06: qty 2
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
## 2016-09-06 NOTE — Discharge Instructions (Signed)
Ibuprofen and Tylenol as needed for pain. Heat, ice, gentle stretching to the affected areas. Muscle relaxant as needed at night. Return to your primary care in the next 4-5 days for reevaluation of your finger. Return to the emergency department if any concerning symptoms develop.

## 2016-09-13 ENCOUNTER — Encounter: Payer: Self-pay | Admitting: Family Medicine

## 2016-09-13 ENCOUNTER — Ambulatory Visit (INDEPENDENT_AMBULATORY_CARE_PROVIDER_SITE_OTHER): Payer: Medicaid Other | Admitting: Family Medicine

## 2016-09-13 ENCOUNTER — Telehealth: Payer: Self-pay

## 2016-09-13 VITALS — BP 134/82 | HR 66 | Temp 97.9°F | Resp 16 | Ht 67.0 in | Wt 160.0 lb

## 2016-09-13 DIAGNOSIS — S63696A Other sprain of right little finger, initial encounter: Secondary | ICD-10-CM | POA: Diagnosis not present

## 2016-09-13 DIAGNOSIS — R42 Dizziness and giddiness: Secondary | ICD-10-CM

## 2016-09-13 DIAGNOSIS — S3991XA Unspecified injury of abdomen, initial encounter: Secondary | ICD-10-CM | POA: Diagnosis not present

## 2016-09-13 DIAGNOSIS — S4991XA Unspecified injury of right shoulder and upper arm, initial encounter: Secondary | ICD-10-CM

## 2016-09-13 DIAGNOSIS — R519 Headache, unspecified: Secondary | ICD-10-CM

## 2016-09-13 DIAGNOSIS — M542 Cervicalgia: Secondary | ICD-10-CM

## 2016-09-13 DIAGNOSIS — R51 Headache: Secondary | ICD-10-CM | POA: Diagnosis not present

## 2016-09-13 DIAGNOSIS — M546 Pain in thoracic spine: Secondary | ICD-10-CM | POA: Diagnosis not present

## 2016-09-13 LAB — POCT URINALYSIS DIP (DEVICE)
BILIRUBIN URINE: NEGATIVE
Glucose, UA: NEGATIVE mg/dL
HGB URINE DIPSTICK: NEGATIVE
Ketones, ur: NEGATIVE mg/dL
NITRITE: NEGATIVE
Protein, ur: NEGATIVE mg/dL
Specific Gravity, Urine: <=1.005 (ref 1.005–1.030)
Urobilinogen, UA: 0.2 mg/dL (ref 0.0–1.0)
pH: 6 (ref 5.0–8.0)

## 2016-09-13 MED ORDER — METHOCARBAMOL 500 MG PO TABS: 500.0000 mg | ORAL_TABLET | Freq: Four times a day (QID) | ORAL | 1 refills | Status: DC
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
## 2016-09-13 NOTE — Telephone Encounter (Signed)
Imaging was scheduled and patient was notified. Stat referral was sent to Dr. Wyline MoodWeiner office.
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
## 2016-09-18 ENCOUNTER — Telehealth: Payer: Self-pay
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
## 2016-09-18 NOTE — Telephone Encounter (Signed)
-----   Message from Bing NeighborsKimberly S Harris, FNP sent at 09/17/2016 10:13 PM EDT ----- Please correct the SPO2 reading for this patient. EMR  is still showing 66% oppose to 99 %

## 2016-09-18 NOTE — Telephone Encounter (Signed)
Left a vm for the patient to callback. Appointment is rescheduled 09/22/2016 at 11 and 12. Patient must arrive at 1045.

## 2016-09-20 ENCOUNTER — Telehealth: Payer: Self-pay

## 2016-09-20 ENCOUNTER — Encounter: Payer: Self-pay | Admitting: Physical Therapy

## 2016-09-20 ENCOUNTER — Ambulatory Visit: Payer: Medicaid Other | Attending: Family Medicine | Admitting: Physical Therapy

## 2016-09-20 DIAGNOSIS — L299 Pruritus, unspecified: Secondary | ICD-10-CM

## 2016-09-20 DIAGNOSIS — M25511 Pain in right shoulder: Secondary | ICD-10-CM

## 2016-09-20 DIAGNOSIS — M545 Low back pain, unspecified: Secondary | ICD-10-CM

## 2016-09-20 DIAGNOSIS — M542 Cervicalgia: Secondary | ICD-10-CM

## 2016-09-20 DIAGNOSIS — M546 Pain in thoracic spine: Secondary | ICD-10-CM

## 2016-09-20 MED ORDER — HYDROXYZINE HCL 25 MG PO TABS: 25.0000 mg | ORAL_TABLET | Freq: Four times a day (QID) | ORAL | 0 refills | Status: AC | PRN

## 2016-09-20 NOTE — Telephone Encounter (Signed)
Left a vm for patient to callback 

## 2016-09-20 NOTE — Telephone Encounter (Signed)
I will send her over some hydroxyzine. She may take 1-2 tablets at least 30 minutes prior to CT of head. She will need someone to drive her home as this medication causes marked sedation.

## 2016-09-20 NOTE — Patient Instructions (Signed)
  Decompression series handout 
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
## 2016-09-22 ENCOUNTER — Ambulatory Visit (HOSPITAL_COMMUNITY): Payer: Medicaid Other

## 2016-09-25 ENCOUNTER — Ambulatory Visit (INDEPENDENT_AMBULATORY_CARE_PROVIDER_SITE_OTHER): Payer: Medicaid Other

## 2016-09-25 ENCOUNTER — Encounter (INDEPENDENT_AMBULATORY_CARE_PROVIDER_SITE_OTHER): Payer: Self-pay | Admitting: Physician Assistant

## 2016-09-25 ENCOUNTER — Ambulatory Visit (INDEPENDENT_AMBULATORY_CARE_PROVIDER_SITE_OTHER): Payer: Medicaid Other | Admitting: Physician Assistant

## 2016-09-25 DIAGNOSIS — M79644 Pain in right finger(s): Secondary | ICD-10-CM
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
## 2016-09-28 ENCOUNTER — Other Ambulatory Visit (HOSPITAL_COMMUNITY): Payer: Medicaid Other

## 2016-09-28 ENCOUNTER — Ambulatory Visit (HOSPITAL_COMMUNITY): Payer: No Typology Code available for payment source

## 2016-09-29 ENCOUNTER — Ambulatory Visit (HOSPITAL_COMMUNITY): Payer: Self-pay

## 2016-09-29 ENCOUNTER — Ambulatory Visit (HOSPITAL_COMMUNITY)
Admission: RE | Admit: 2016-09-29 | Discharge: 2016-09-29 | Disposition: A | Discharge status: Home / Self Care | Payer: Medicaid Other | Source: Ambulatory Visit | Attending: Family Medicine | Admitting: Family Medicine

## 2016-09-29 ENCOUNTER — Telehealth (INDEPENDENT_AMBULATORY_CARE_PROVIDER_SITE_OTHER): Payer: Self-pay

## 2016-09-29 DIAGNOSIS — S3991XA Unspecified injury of abdomen, initial encounter: Secondary | ICD-10-CM | POA: Diagnosis not present

## 2016-09-29 DIAGNOSIS — Z9049 Acquired absence of other specified parts of digestive tract: Secondary | ICD-10-CM | POA: Insufficient documentation

## 2016-09-29 DIAGNOSIS — R519 Headache, unspecified: Secondary | ICD-10-CM

## 2016-09-29 DIAGNOSIS — R51 Headache: Secondary | ICD-10-CM | POA: Insufficient documentation

## 2016-09-29 DIAGNOSIS — R42 Dizziness and giddiness: Secondary | ICD-10-CM | POA: Diagnosis present

## 2016-09-29 NOTE — Telephone Encounter (Signed)
Patient would like a Rx for pain.  CB# is [phone].

## 2016-10-03 NOTE — Telephone Encounter (Signed)
LMOM of the below message from [place]

## 2016-10-03 NOTE — Telephone Encounter (Signed)
  She can take her NSAID she does not have a fracture.

## 2016-10-03 NOTE — Telephone Encounter (Signed)
Please advise 

## 2016-10-24 ENCOUNTER — Ambulatory Visit: Payer: Medicaid Other | Admitting: Family Medicine

## 2016-10-25 ENCOUNTER — Ambulatory Visit: Payer: Medicaid Other | Admitting: Family Medicine

## 2016-10-30 ENCOUNTER — Ambulatory Visit (INDEPENDENT_AMBULATORY_CARE_PROVIDER_SITE_OTHER): Payer: Medicaid Other | Admitting: Orthopaedic Surgery

## 2016-11-20 ENCOUNTER — Encounter: Payer: Self-pay | Admitting: Family Medicine

## 2016-11-20 ENCOUNTER — Ambulatory Visit (INDEPENDENT_AMBULATORY_CARE_PROVIDER_SITE_OTHER): Payer: Medicaid Other | Admitting: Family Medicine

## 2016-11-20 VITALS — BP 140/60 | HR 72 | Temp 98.3°F | Resp 16 | Ht 67.0 in | Wt 157.0 lb

## 2016-11-20 DIAGNOSIS — S4991XA Unspecified injury of right shoulder and upper arm, initial encounter: Secondary | ICD-10-CM | POA: Diagnosis not present

## 2016-11-20 DIAGNOSIS — I10 Essential (primary) hypertension: Secondary | ICD-10-CM

## 2016-11-20 DIAGNOSIS — M79645 Pain in left finger(s): Secondary | ICD-10-CM | POA: Diagnosis not present

## 2016-11-20 LAB — POCT URINALYSIS DIP (DEVICE)
BILIRUBIN URINE: NEGATIVE
GLUCOSE, UA: NEGATIVE mg/dL
HGB URINE DIPSTICK: NEGATIVE
KETONES UR: NEGATIVE mg/dL
Nitrite: NEGATIVE
Protein, ur: NEGATIVE mg/dL
UROBILINOGEN UA: 0.2 mg/dL (ref 0.0–1.0)
pH: 5.5 (ref 5.0–8.0)

## 2016-11-20 MED ORDER — MELOXICAM 15 MG PO TABS: 15.0000 mg | ORAL_TABLET | Freq: Every day | ORAL | 0 refills | Status: DC

## 2016-11-20 MED ORDER — LISINOPRIL 20 MG PO TABS: 20.0000 mg | ORAL_TABLET | Freq: Every day | ORAL | 3 refills | Status: AC

## 2016-11-20 NOTE — Patient Instructions (Addendum)
Continue meloxicam 15 mg for shoulder pain.  Continue antihistamine, Benadryl, as needed for congestion.  I am referring [facility] to [facility] for your chronic shoulder pain for further evaluation.  Discontinue taking amlodipine, start lisinopril 20 mg once daily. Return in 3 weeks for blood pressure recheck.    Chronic Back Pain When back pain lasts longer than 3 months, it is called chronic back pain.The cause of your back pain may not be known. Some common causes include:  Wear and tear (degenerative disease) of the bones, ligaments, or disks in your back.  Inflammation and stiffness in your back (arthritis).  People who have chronic back pain often go through certain periods in which the pain is more intense (flare-ups). Many people can learn to manage the pain with home care. Follow these instructions at home: Pay attention to any changes in your symptoms. Take these actions to help with your pain: Activity  Avoid bending and activities that make the problem worse.  Do not sit or stand in one place for long periods of time.  Take brief periods of rest throughout the day. This will reduce your pain. Resting in a lying or standing position is usually better than sitting to rest.  When you are resting for longer periods, mix in some mild activity or stretching between periods of rest. This will help to prevent stiffness and pain.  Get regular exercise. Ask your health care provider what activities are safe for you.  Do not lift anything that is heavier than 10 lb (4.5 kg). Always use proper lifting technique, which includes: ? Bending your knees. ? Keeping the load close to your body. ? Avoiding twisting. Managing pain  If directed, apply ice to the painful area. Your health care provider may recommend applying ice during the first 24-48 hours after a flare-up begins. ? Put ice in a plastic bag. ? Place a towel between your skin and the bag. ? Leave the ice on for 20  minutes, 2-3 times per day.  After icing, apply heat to the affected area as often as told by your health care provider. Use the heat source that your health care provider recommends, such as a moist heat pack or a heating pad. ? Place a towel between your skin and the heat source. ? Leave the heat on for 20-30 minutes. ? Remove the heat if your skin turns bright red. This is especially important if you are unable to feel pain, heat, or cold. You may have a greater risk of getting burned.  Try soaking in a warm tub.  Take over-the-counter and prescription medicines only as told by your health care provider.  Keep all follow-up visits as told by your health care provider. This is important. Contact a health care provider if:  You have pain that is not relieved with rest or medicine. Get help right away if:  You have weakness or numbness in one or both of your legs or feet.  You have trouble controlling your bladder or your bowels.  You have nausea or vomiting.  You have pain in your abdomen.  You have shortness of breath or you faint. This information is not intended to replace advice given to you by your health care provider. Make sure you discuss any questions you have with your health care provider. Document Released: 06/01/2004 Document Revised: 09/02/2015 Document Reviewed: 10/12/2014 Elsevier Interactive Patient Education  2018 [employer]
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
## 2016-11-22 ENCOUNTER — Ambulatory Visit (INDEPENDENT_AMBULATORY_CARE_PROVIDER_SITE_OTHER): Payer: Medicaid Other

## 2016-11-22 ENCOUNTER — Ambulatory Visit (INDEPENDENT_AMBULATORY_CARE_PROVIDER_SITE_OTHER): Payer: Medicaid Other | Admitting: Orthopaedic Surgery

## 2016-11-22 ENCOUNTER — Encounter (INDEPENDENT_AMBULATORY_CARE_PROVIDER_SITE_OTHER): Payer: Self-pay | Admitting: Orthopaedic Surgery

## 2016-11-22 DIAGNOSIS — M25511 Pain in right shoulder: Secondary | ICD-10-CM | POA: Diagnosis not present

## 2016-11-22 DIAGNOSIS — G8929 Other chronic pain: Secondary | ICD-10-CM | POA: Insufficient documentation

## 2016-11-22 DIAGNOSIS — M79644 Pain in right finger(s): Secondary | ICD-10-CM | POA: Diagnosis not present

## 2016-11-22 DIAGNOSIS — M79645 Pain in left finger(s): Secondary | ICD-10-CM

## 2016-11-22 MED ORDER — TRAMADOL HCL 50 MG PO TABS: 100.0000 mg | ORAL_TABLET | Freq: Three times a day (TID) | ORAL | 0 refills | Status: DC | PRN

## 2016-11-22 MED ORDER — METHYLPREDNISOLONE 4 MG PO TABS: ORAL_TABLET | ORAL | 0 refills | Status: DC
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
## 2016-11-27 ENCOUNTER — Ambulatory Visit: Payer: Medicaid Other | Admitting: Family Medicine

## 2016-12-20 ENCOUNTER — Ambulatory Visit (INDEPENDENT_AMBULATORY_CARE_PROVIDER_SITE_OTHER): Payer: Medicaid Other | Admitting: Orthopaedic Surgery

## 2016-12-20 ENCOUNTER — Other Ambulatory Visit (INDEPENDENT_AMBULATORY_CARE_PROVIDER_SITE_OTHER): Payer: Self-pay

## 2016-12-20 DIAGNOSIS — M25511 Pain in right shoulder: Principal | ICD-10-CM

## 2016-12-20 DIAGNOSIS — G8929 Other chronic pain: Secondary | ICD-10-CM | POA: Diagnosis not present

## 2016-12-20 MED ORDER — LIDOCAINE HCL 1 % IJ SOLN
3.0000 mL | INTRAMUSCULAR | Status: AC | PRN
  Administered 2016-12-20: 3 mL

## 2016-12-20 MED ORDER — METHYLPREDNISOLONE ACETATE 40 MG/ML IJ SUSP
40.0000 mg | INTRAMUSCULAR | Status: AC | PRN
  Administered 2016-12-20: 40 mg via INTRA_ARTICULAR
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
## 2016-12-25 ENCOUNTER — Telehealth (INDEPENDENT_AMBULATORY_CARE_PROVIDER_SITE_OTHER): Payer: Self-pay | Admitting: Orthopaedic Surgery

## 2016-12-25 NOTE — Telephone Encounter (Signed)
Patient has MRI on the 25th and is very claustrophobic and was wanting to get a refill on her diazepam. CB # [phone]

## 2016-12-26 ENCOUNTER — Other Ambulatory Visit (INDEPENDENT_AMBULATORY_CARE_PROVIDER_SITE_OTHER): Payer: Self-pay | Admitting: Orthopaedic Surgery

## 2016-12-26 ENCOUNTER — Other Ambulatory Visit (INDEPENDENT_AMBULATORY_CARE_PROVIDER_SITE_OTHER): Payer: Self-pay

## 2016-12-26 MED ORDER — DIAZEPAM 5 MG PO TABS: ORAL_TABLET | ORAL | 0 refills | Status: DC

## 2016-12-26 NOTE — Telephone Encounter (Signed)
Ok

## 2016-12-26 NOTE — Telephone Encounter (Signed)
Please call in some Valium 5 mg to take orally 30 minutes to 1 hour prior to her procedure. #2 pills no refills.

## 2016-12-26 NOTE — Telephone Encounter (Signed)
Called into pharmacy

## 2016-12-27 ENCOUNTER — Telehealth: Payer: Self-pay

## 2016-12-27 ENCOUNTER — Other Ambulatory Visit: Payer: Self-pay | Admitting: Family Medicine

## 2016-12-27 ENCOUNTER — Telehealth (INDEPENDENT_AMBULATORY_CARE_PROVIDER_SITE_OTHER): Payer: Self-pay | Admitting: Orthopaedic Surgery

## 2016-12-27 DIAGNOSIS — Z1231 Encounter for screening mammogram for malignant neoplasm of breast: Secondary | ICD-10-CM

## 2016-12-27 NOTE — Telephone Encounter (Signed)
Please advise 

## 2016-12-27 NOTE — Telephone Encounter (Signed)
I did give her an injection in her shoulder at her last visit. We'll have to see her back to document how she is responded to that injection before they may approve an MRI of that shoulder.

## 2016-12-27 NOTE — Telephone Encounter (Signed)
Patient called saying that her medicaid didn't approve her MRI, and she was wondering what she needed to do to get this done. She stated she is in a lot of pain. CB # [phone]

## 2016-12-28 NOTE — Telephone Encounter (Signed)
Please contact this patient back to advise Dr. Magnus Ivan has ordered her MRI and is managing her right shoulder pain. She will need to schedule a follow-up with his office regarding her concerns with the MRI (last documentation I see, shows that the MRI was not approved by insurance) and additional treatments for her persisten right shoulder pain.  Thanks,  [name]. [name], MSN, FNP-C [facility]  [address] [place], [place] [address] [phone]

## 2016-12-28 NOTE — Telephone Encounter (Signed)
Called and advised patient to make an appointment for Dr. Rayburn Ma since he was the one who ordered her MRI. Patient stated she wants something for her Claustrophobia and anxiety which she has all the time, not only during imaging scans. I asked that she schedule an appointment to come in and discuss this with provider. She did not want to make an "extra" appointment now, but wants to keep her visit in October, because she can not afford to pay "all these 3 dollar co-pays".

## 2016-12-29 ENCOUNTER — Other Ambulatory Visit (INDEPENDENT_AMBULATORY_CARE_PROVIDER_SITE_OTHER): Payer: Self-pay

## 2016-12-29 MED ORDER — ACETAMINOPHEN-CODEINE #3 300-30 MG PO TABS: 1.0000 | ORAL_TABLET | Freq: Four times a day (QID) | ORAL | 0 refills | Status: DC | PRN

## 2016-12-29 NOTE — Telephone Encounter (Signed)
She states she's in a lot of pain Tramadol isn't working for her and since she can't have MRI right now she would like something different for pain I told her most likely it wouldn't be anything higher than Tylenol #3 if you did agree to give her something stronger

## 2016-12-29 NOTE — Telephone Encounter (Signed)
We can only provide tylenol#3, 1-2 every 8 hours as needed.  When we see her back next week, we can see about ordering a MRI again.

## 2016-12-29 NOTE — Telephone Encounter (Signed)
Called into pharmacy

## 2016-12-30 ENCOUNTER — Inpatient Hospital Stay: Admission: RE | Admit: 2016-12-30 | Payer: Medicaid Other | Source: Ambulatory Visit

## 2017-01-01 NOTE — Telephone Encounter (Signed)
Left a vm for patient to callback 

## 2017-01-01 NOTE — Telephone Encounter (Signed)
Please contact patient to advise she will need to discuss her plan of care with Dr. Rayburn Ma regarding her continued pain and MRI plan. Once an MRI is actually scheduled, I can send over a prescription for hydroxyzine for her to take prior to her scan.  [name]. [name], MSN, FNP-C [facility]  [address] [place], [place] [address] [phone]

## 2017-01-02 NOTE — Telephone Encounter (Signed)
Left another vm for patient to callback  

## 2017-01-03 ENCOUNTER — Ambulatory Visit (INDEPENDENT_AMBULATORY_CARE_PROVIDER_SITE_OTHER): Payer: Medicaid Other | Admitting: Orthopaedic Surgery

## 2017-01-03 DIAGNOSIS — M25511 Pain in right shoulder: Secondary | ICD-10-CM

## 2017-01-03 DIAGNOSIS — G8929 Other chronic pain: Secondary | ICD-10-CM
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
## 2017-01-11 ENCOUNTER — Encounter (HOSPITAL_COMMUNITY): Payer: Self-pay | Admitting: *Deleted

## 2017-01-11 ENCOUNTER — Emergency Department (HOSPITAL_COMMUNITY)
Admission: EM | Admit: 2017-01-11 | Discharge: 2017-01-11 | Disposition: A | Discharge status: Home / Self Care | Payer: Medicaid Other | Attending: Emergency Medicine | Admitting: Emergency Medicine

## 2017-01-11 ENCOUNTER — Ambulatory Visit
Admission: RE | Admit: 2017-01-11 | Discharge: 2017-01-11 | Disposition: A | Discharge status: Home / Self Care | Payer: Medicaid Other | Source: Ambulatory Visit | Attending: Family Medicine | Admitting: Family Medicine

## 2017-01-11 DIAGNOSIS — Z1231 Encounter for screening mammogram for malignant neoplasm of breast: Secondary | ICD-10-CM

## 2017-01-11 DIAGNOSIS — F17210 Nicotine dependence, cigarettes, uncomplicated: Secondary | ICD-10-CM | POA: Diagnosis not present

## 2017-01-11 DIAGNOSIS — G8929 Other chronic pain: Secondary | ICD-10-CM | POA: Diagnosis not present

## 2017-01-11 DIAGNOSIS — Z79899 Other long term (current) drug therapy: Secondary | ICD-10-CM | POA: Insufficient documentation

## 2017-01-11 DIAGNOSIS — M25511 Pain in right shoulder: Secondary | ICD-10-CM | POA: Diagnosis present

## 2017-01-11 DIAGNOSIS — I10 Essential (primary) hypertension: Secondary | ICD-10-CM | POA: Insufficient documentation

## 2017-01-11 MED ORDER — DOXYLAMINE SUCCINATE (SLEEP) 25 MG PO TABS: 25.0000 mg | ORAL_TABLET | Freq: Every evening | ORAL | 0 refills | Status: DC | PRN
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
## 2017-01-11 NOTE — ED Notes (Signed)
See edp assessment 

## 2017-01-11 NOTE — Discharge Instructions (Signed)
Get help right away if: Your arm, hand, or fingers: Tingle. Become numb. Become swollen. Become painful. Turn white or blue. 
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
## 2017-01-15 ENCOUNTER — Other Ambulatory Visit: Payer: Self-pay | Admitting: Family Medicine

## 2017-01-15 DIAGNOSIS — R928 Other abnormal and inconclusive findings on diagnostic imaging of breast: Secondary | ICD-10-CM

## 2017-01-19 ENCOUNTER — Other Ambulatory Visit: Payer: Medicaid Other

## 2017-01-19 ENCOUNTER — Ambulatory Visit: Payer: Medicaid Other | Admitting: Family Medicine

## 2017-01-22 ENCOUNTER — Ambulatory Visit: Payer: Medicaid Other | Admitting: Family Medicine

## 2017-01-22 ENCOUNTER — Inpatient Hospital Stay: Admission: RE | Admit: 2017-01-22 | Payer: Medicaid Other | Source: Ambulatory Visit

## 2017-01-23 ENCOUNTER — Telehealth (INDEPENDENT_AMBULATORY_CARE_PROVIDER_SITE_OTHER): Payer: Self-pay | Admitting: Radiology

## 2017-01-23 ENCOUNTER — Other Ambulatory Visit (INDEPENDENT_AMBULATORY_CARE_PROVIDER_SITE_OTHER): Payer: Self-pay

## 2017-01-23 DIAGNOSIS — G8929 Other chronic pain: Secondary | ICD-10-CM

## 2017-01-23 DIAGNOSIS — M25511 Pain in right shoulder: Principal | ICD-10-CM

## 2017-01-23 NOTE — Telephone Encounter (Signed)
Order was in, this was denied we will re-try again

## 2017-01-23 NOTE — Telephone Encounter (Signed)
Please call patient back regarding getting an MRI scheduled for her Right shoulder. Her call back number is [phone].

## 2017-01-24 NOTE — Telephone Encounter (Signed)
Patient called stating that the medicine she was given is not working.  She is waiting for her MRI.  She is not able to move her arm around and stated that it hurts too much at night that she can hardly sleep.  She is out of pain medication.  She would like a nurse to call her back.  CB#[phone].  Thank you.

## 2017-01-24 NOTE — Telephone Encounter (Signed)
Please advise 

## 2017-01-26 ENCOUNTER — Ambulatory Visit
Admission: RE | Admit: 2017-01-26 | Discharge: 2017-01-26 | Disposition: A | Discharge status: Home / Self Care | Payer: Medicaid Other | Source: Ambulatory Visit | Attending: Family Medicine | Admitting: Family Medicine

## 2017-01-26 ENCOUNTER — Other Ambulatory Visit: Payer: Self-pay | Admitting: Family Medicine

## 2017-01-26 ENCOUNTER — Telehealth (INDEPENDENT_AMBULATORY_CARE_PROVIDER_SITE_OTHER): Payer: Self-pay | Admitting: Radiology

## 2017-01-26 DIAGNOSIS — R928 Other abnormal and inconclusive findings on diagnostic imaging of breast: Secondary | ICD-10-CM

## 2017-01-26 DIAGNOSIS — N63 Unspecified lump in unspecified breast: Secondary | ICD-10-CM

## 2017-01-26 NOTE — Telephone Encounter (Signed)
Patient called and asked if her MRI had been approved, and per Martie Lee it is still pending online at [employer].  Patient also said she has called multiple times to ask about her pain medication.  She would like something for pain, she can hardly sleep.  Please call her Monday and advise on meds, and on the MRI if you can, thanks.

## 2017-01-29 NOTE — Telephone Encounter (Signed)
The only thing that she can have from pain from my standpoint is Tylenol 3 or tramadol. Whichever of the she wants place and then 1-2 every 8-12 hours as needed #60.

## 2017-01-29 NOTE — Telephone Encounter (Signed)
Called and [facility] for patient telling her approval for MRI has not yet been given also to call back and let us know whether she would want Tylenol#3 or Tramadol

## 2017-01-29 NOTE — Telephone Encounter (Signed)
Patient asking for something for pain

## 2017-01-31 ENCOUNTER — Ambulatory Visit: Payer: Medicaid Other | Admitting: Family Medicine

## 2017-02-11 ENCOUNTER — Inpatient Hospital Stay: Admission: RE | Admit: 2017-02-11 | Payer: Medicaid Other | Source: Ambulatory Visit

## 2017-02-20 ENCOUNTER — Ambulatory Visit (INDEPENDENT_AMBULATORY_CARE_PROVIDER_SITE_OTHER): Payer: Medicaid Other | Admitting: Family Medicine

## 2017-02-20 ENCOUNTER — Encounter: Payer: Self-pay | Admitting: Family Medicine

## 2017-02-20 VITALS — BP 130/66 | HR 68 | Temp 98.3°F | Resp 12 | Ht 67.0 in | Wt 155.2 lb

## 2017-02-20 DIAGNOSIS — J410 Simple chronic bronchitis: Secondary | ICD-10-CM

## 2017-02-20 DIAGNOSIS — Z131 Encounter for screening for diabetes mellitus: Secondary | ICD-10-CM | POA: Diagnosis not present

## 2017-02-20 DIAGNOSIS — G4700 Insomnia, unspecified: Secondary | ICD-10-CM | POA: Diagnosis not present

## 2017-02-20 DIAGNOSIS — Z130 Encounter for screening for diseases of the blood and blood-forming organs and certain disorders involving the immune mechanism: Secondary | ICD-10-CM | POA: Diagnosis not present

## 2017-02-20 DIAGNOSIS — I10 Essential (primary) hypertension: Secondary | ICD-10-CM | POA: Diagnosis not present

## 2017-02-20 DIAGNOSIS — Z13220 Encounter for screening for lipoid disorders: Secondary | ICD-10-CM

## 2017-02-20 LAB — LIPID PANEL
CHOL/HDL RATIO: 3 (calc) (ref <5.0)
CHOLESTEROL: 218 mg/dL — AB (ref <200)
HDL: 72 mg/dL (ref >50)
LDL Cholesterol (Calc): 126 mg/dL (calc) — ABNORMAL HIGH
NON-HDL CHOLESTEROL (CALC): 146 mg/dL — AB (ref <130)
TRIGLYCERIDES: 98 mg/dL (ref <150)

## 2017-02-20 LAB — CBC WITH DIFFERENTIAL/PLATELET
BASOS PCT: 0.9 %
Basophils Absolute: 49 cells/uL (ref 0–200)
EOS ABS: 178 {cells}/uL (ref 15–500)
Eosinophils Relative: 3.3 %
HEMATOCRIT: 40.5 % (ref 35.0–45.0)
HEMOGLOBIN: 13.4 g/dL (ref 11.7–15.5)
Lymphs Abs: 2311 cells/uL (ref 850–3900)
MCH: 28.5 pg (ref 27.0–33.0)
MCHC: 33.1 g/dL (ref 32.0–36.0)
MCV: 86.2 fL (ref 80.0–100.0)
MPV: 9.2 fL (ref 7.5–12.5)
Monocytes Relative: 11.1 %
NEUTROS ABS: 2263 {cells}/uL (ref 1500–7800)
Neutrophils Relative %: 41.9 %
Platelets: 385 10*3/uL (ref 140–400)
RBC: 4.7 10*6/uL (ref 3.80–5.10)
RDW: 12.4 % (ref 11.0–15.0)
Total Lymphocyte: 42.8 %
WBC: 5.4 10*3/uL (ref 3.8–10.8)
WBCMIX: 599 {cells}/uL (ref 200–950)

## 2017-02-20 LAB — COMPLETE METABOLIC PANEL WITH GFR
AG Ratio: 1.4 (calc) (ref 1.0–2.5)
ALT: 8 U/L (ref 6–29)
AST: 16 U/L (ref 10–35)
Albumin: 4.3 g/dL (ref 3.6–5.1)
Alkaline phosphatase (APISO): 56 U/L (ref 33–115)
BUN/Creatinine Ratio: 7 (calc) (ref 6–22)
BUN: 6 mg/dL — ABNORMAL LOW (ref 7–25)
CALCIUM: 9.3 mg/dL (ref 8.6–10.2)
CO2: 21 mmol/L (ref 20–32)
CREATININE: 0.86 mg/dL (ref 0.50–1.10)
Chloride: 104 mmol/L (ref 98–110)
GFR, EST NON AFRICAN AMERICAN: 80 mL/min/{1.73_m2} (ref >=60)
GFR, Est African American: 93 mL/min/{1.73_m2} (ref >=60)
GLOBULIN: 3 g/dL (ref 1.9–3.7)
Glucose, Bld: 82 mg/dL (ref 65–99)
Potassium: 3.7 mmol/L (ref 3.5–5.3)
SODIUM: 135 mmol/L (ref 135–146)
Total Bilirubin: 0.5 mg/dL (ref 0.2–1.2)
Total Protein: 7.3 g/dL (ref 6.1–8.1)

## 2017-02-20 MED ORDER — AMOXICILLIN-POT CLAVULANATE 875-125 MG PO TABS: 1.0000 | ORAL_TABLET | Freq: Two times a day (BID) | ORAL | 0 refills | Status: DC

## 2017-02-20 MED ORDER — DOXYLAMINE SUCCINATE (SLEEP) 25 MG PO TABS: 25.0000 mg | ORAL_TABLET | Freq: Every evening | ORAL | 2 refills | Status: DC | PRN

## 2017-02-20 MED ORDER — BUDESONIDE-FORMOTEROL FUMARATE 80-4.5 MCG/ACT IN AERO: 2.0000 | INHALATION_SPRAY | Freq: Two times a day (BID) | RESPIRATORY_TRACT | 3 refills | Status: DC

## 2017-02-20 MED ORDER — FLUCONAZOLE 150 MG PO TABS: 150.0000 mg | ORAL_TABLET | Freq: Once | ORAL | 0 refills | Status: AC

## 2017-02-20 NOTE — Progress Notes (Signed)
Patient ID: Barbara Alvarez, female    DOB: 06/05/1965, 51 y.o.   MRN: [id number]  PCP: Bing Neighbors, FNP  Chief Complaint  Patient presents with  . Follow-up    3 MONTH    Subjective:  HPI Barbara Alvarez is a 51 y.o. female presents for 3 month follow-up. Medical problems include essential hypertension, bipolar disease, current daily smoker, bronchitis, chronic insomnia.  [name] is followed by psychiatry routinely treatment of her psychotropic medications.  Suffers from hypertension which is routinely well controlled.  She currently takes lisinopril 20 mg once daily.  Does not routinely monitor her blood pressure at home or engage in routine physical activity.  Denies any chest pain, shortness of breath, chronic headaches, dizziness.  [name] has another complaint today of persistent coughing, nasal drainage, and some shortness of breath.  Reports the symptoms have been consistent for the last 2 weeks.  Denies fever although reports that she recently has started to develop an itchy throat and associated chest tightness.  She also denies body aches, chills, abdominal pain. She has not taken any over-the-counter medication as she was unaware of what to take.  She also continues to complain if occult he falling asleep.  She was prescribed Unasyn after her recent ED visit and would like a refill on this medication. Social History   Social History  . Marital status: Divorced    Spouse name: N/A  . Number of children: N/A  . Years of education: N/A   Occupational History  . Not on file.   Social History Main Topics  . Smoking status: Current Every Day Smoker    Packs/day: 0.25    Types: Cigarettes  . Smokeless tobacco: Never Used  . Alcohol use Yes     Comment: hasn't had drink in about 1 wk  . Drug use: No  . Sexual activity: Not on file   Other Topics Concern  . Not on file   Social History Narrative  . No narrative on file    Family History  Problem Relation Age of  Onset  . Cancer Mother   . Hypertension Father   . Diabetes Other    Review of Systems See HPI Patient Active Problem List   Diagnosis Date Noted  . Pain in right finger(s) 11/22/2016  . Finger pain, left 11/22/2016  . Chronic right shoulder pain 11/22/2016  . Essential hypertension 05/30/2016  . Right shoulder injury, initial encounter 05/30/2016  . Seasonal allergic rhinitis 05/30/2016  . Muscle spasm of right shoulder 05/30/2016  . Need for immunization against influenza 05/30/2016  . Bipolar disorder (HCC) 05/30/2016  . Acquired mallet finger of right hand 06/15/2015    Allergies  Allergen Reactions  . Haloperidol Decanoate Anaphylaxis  . Ibuprofen Shortness Of Breath  . Flexeril [Cyclobenzaprine] Other (See Comments)    Unknown  . Naproxen Other (See Comments)    Nasal Drainage  . Tramadol Swelling    Prior to Admission medications   Medication Sig Start Date End Date Taking? Authorizing Provider  acetaminophen-codeine (TYLENOL #3) 300-30 MG tablet Take 1-2 tablets by mouth every 6 (six) hours as needed for moderate pain. 12/29/16  Yes Kathryne Hitch, MD  albuterol (PROVENTIL HFA;VENTOLIN HFA) 108 (90 Base) MCG/ACT inhaler Inhale 2 puffs into the lungs every 6 (six) hours as needed for wheezing or shortness of breath. 05/30/16  Yes Hedges, Tinnie Gens, PA-C  albuterol (PROVENTIL) (2.5 MG/3ML) 0.083% nebulizer solution Take 3 mLs (2.5 mg total) by nebulization every  6 (six) hours as needed for wheezing or shortness of breath. 05/30/16  Yes Hedges, Tinnie Gens, PA-C  carbamazepine (EQUETRO) 200 MG CP12 12 hr capsule Take 1 capsule (200 mg total) by mouth at bedtime. 09/07/14  Yes Neva Seat, Tiffany, PA-C  clonazePAM (KLONOPIN) 1 MG tablet Take 1 mg by mouth 3 (three) times daily as needed for anxiety.   Yes [provider]  doxylamine, Sleep, (UNISOM) 25 MG tablet Take 1 tablet (25 mg total) by mouth at bedtime as needed. 01/11/17  Yes Halona Amstutz, Abigail, PA-C  hydrOXYzine  (ATARAX/VISTARIL) 25 MG tablet Take 1-2 tablets (25-50 mg total) by mouth every 6 (six) hours as needed for itching. 09/20/16  Yes Bing Neighbors, FNP  lisinopril (PRINIVIL,ZESTRIL) 20 MG tablet Take 1 tablet (20 mg total) by mouth daily. 11/20/16  Yes Bing Neighbors, FNP  permethrin (ELIMITE) 5 % cream Apply to affected area once from neck down. Leave on for 8 hours then wash off.  Repeat in 1 week. 05/20/14  Yes Alvira Monday, MD  risperiDONE (RISPERDAL) 0.5 MG tablet Take 0.5 mg by mouth 2 (two) times daily.    Yes [provider]  diazepam (VALIUM) 5 MG tablet Take one by mouth one hour prior to MRI, repeat as needed Patient not taking: Reported on 02/20/2017 12/26/16   Kathryne Hitch, MD  meloxicam (MOBIC) 15 MG tablet Take 1 tablet (15 mg total) by mouth daily. Patient not taking: Reported on 02/20/2017 11/20/16   Bing Neighbors, FNP  methocarbamol (ROBAXIN) 500 MG tablet Take 1 tablet (500 mg total) by mouth 4 (four) times daily. Patient not taking: Reported on 02/20/2017 09/13/16   Bing Neighbors, FNP  methylPREDNISolone (MEDROL) 4 MG tablet Medrol dose pack. Take as instructed 11/22/16   Kathryne Hitch, MD  nicotine (NICODERM CQ) 14 mg/24hr patch Place 1 patch (14 mg total) onto the skin daily. Patient not taking: Reported on 02/20/2017 06/10/15   Henrietta Hoover, NP  traMADol (ULTRAM) 50 MG tablet Take 2 tablets (100 mg total) by mouth 3 (three) times daily as needed. Patient not taking: Reported on 02/20/2017 11/22/16   Kathryne Hitch, MD    Past Medical, Surgical Family and Social History reviewed and updated.    Objective:   Today's Vitals   02/20/17 0924  BP: 130/66  Pulse: 68  Resp: 12  Temp: 98.3 F (36.8 C)  TempSrc: Oral  SpO2: 99%  Weight: 155 lb 3.2 oz (70.4 kg)  Height:  (1.702 m)    Wt Readings from Last 3 Encounters:  02/20/17 155 lb 3.2 oz (70.4 kg)  01/11/17 157 lb (71.2 kg)  11/20/16 157 lb (71.2 kg)    Physical Exam  Constitutional: She is oriented to person, place, and time. She appears well-developed and well-nourished.  HENT:  Head: Atraumatic.  Right Ear: Hearing, tympanic membrane, external ear and ear canal normal.  Left Ear: Hearing, tympanic membrane, external ear and ear canal normal.  Nose: Rhinorrhea present.  Mouth/Throat: No oropharyngeal exudate, posterior oropharyngeal edema, posterior oropharyngeal erythema or tonsillar abscesses.  Eyes: Pupils are equal, round, and reactive to light. Conjunctivae and EOM are normal.  Neck: Normal range of motion. Neck supple. No thyromegaly present.  Cardiovascular: Normal rate, regular rhythm, normal heart sounds and intact distal pulses.   Pulmonary/Chest: She has wheezes.  Dry hacking cough noted during exam  Abdominal: Soft. Bowel sounds are normal.  Musculoskeletal: Normal range of motion.  Lymphadenopathy:    She has no  cervical adenopathy.  Neurological: She is alert and oriented to person, place, and time.  Skin: Skin is warm and dry.  Psychiatric: She has a normal mood and affect. Her behavior is normal. Judgment and thought content normal.   Assessment & Plan:  1. Essential hypertension, controlled.  Continue lisinopril 20 mg once daily. 2. Simple chronic bronchitis (HCC), current smoking cessation.  Amoxicillin 875-125 mg twice daily times 10 days.  Added albuterol inhaler 2 puffs every 4-6 hours as needed for wheezing or shortness of breath.  Adding Symbicort 2 puffs twice daily for symptoms likely associated with COPD secondary to chronic smoking. 3. Screening, lipid- Lipid panel 4. Screening for diabetes mellitus- COMPLETE METABOLIC PANEL WITH GFR 5. Screening for deficiency anemia- CBC with Differential 6.  Insomnia, start Doxylamine Succinate 25 mg as needed for sleep   Meds ordered this encounter  Medications  . budesonide-formoterol (SYMBICORT) 80-4.5 MCG/ACT inhaler    Sig: Inhale 2 puffs into the lungs 2  (two) times daily.    Dispense:  1 Inhaler    Refill:  3    Order Specific Question:   Supervising Provider    Answer:   Quentin Angst [id number]  . amoxicillin-clavulanate (AUGMENTIN) 875-125 MG tablet    Sig: Take 1 tablet by mouth 2 (two) times daily.    Dispense:  20 tablet    Refill:  0    Order Specific Question:   Supervising Provider    Answer:   Quentin Angst [id number]  . fluconazole (DIFLUCAN) 150 MG tablet    Sig: Take 1 tablet (150 mg total) by mouth once. Repeat if needed    Dispense:  2 tablet    Refill:  0    Order Specific Question:   Supervising Provider    Answer:   Quentin Angst [id number]  . doxylamine, Sleep, (UNISOM) 25 MG tablet    Sig: Take 1 tablet (25 mg total) by mouth at bedtime as needed.    Dispense:  30 tablet    Refill:  2    Order Specific Question:   Supervising Provider    Answer:   Quentin Angst [id number]    Orders Placed This Encounter  Procedures  . Lipid panel  . COMPLETE METABOLIC PANEL WITH GFR  . CBC with Differential    Return for care as needed, patient to follow-up in 6 months for chronic disease management.   [name]. [name], MSN, FNP-C [facility]  [address] [place], [place] [address] [phone]

## 2017-02-20 NOTE — Patient Instructions (Addendum)
-Start Augmentin 1 tablet twice daily with food to avoid stomach upset. Complete all medication. -I'm adding Diflucan in case you developed vaginal irritation. -I'm also prescribing use Symbicort for management of chronic bronchitis likely due to COPD.  Administer medication twice daily regardless of symptoms.  -I have reordered your Doxylamine for insomnia.   Chronic Obstructive Pulmonary Disease Chronic obstructive pulmonary disease (COPD) is a long-term (chronic) lung problem. When you have COPD, it is hard for air to get in and out of your lungs. The way your lungs work will never return to normal. Usually the condition gets worse over time. There are things you can do to keep yourself as healthy as possible. Your doctor may treat your condition with:  Medicines.  Quitting smoking, if you smoke.  Rehabilitation. This may involve a team of specialists.  Oxygen.  Exercise and changes to your diet.  Lung surgery.  Comfort measures (palliative care).  Follow these instructions at home: Medicines  Take over-the-counter and prescription medicines only as told by your doctor.  Talk to your doctor before taking any cough or allergy medicines. You may need to avoid medicines that cause your lungs to be dry. Lifestyle  If you smoke, stop. Smoking makes the problem worse. If you need help quitting, ask your doctor.  Avoid being around things that make your breathing worse. This may include smoke, chemicals, and fumes.  Stay active, but remember to also rest.  Learn and use tips on how to relax.  Make sure you get enough sleep. Most adults need at least 7 hours a night.  Eat healthy foods. Eat smaller meals more often. Rest before meals. Controlled breathing  Learn and use tips on how to control your breathing as told by your doctor. Try: ? Breathing in (inhaling) through your nose for 1 second. Then, pucker your lips and breath out (exhale) through your lips for 2  seconds. ? Putting one hand on your belly (abdomen). Breathe in slowly through your nose for 1 second. Your hand on your belly should move out. Pucker your lips and breathe out slowly through your lips. Your hand on your belly should move in as you breathe out. Controlled coughing  Learn and use controlled coughing to clear mucus from your lungs. The steps are: 1. Lean your head a little forward. 2. Breathe in deeply. 3. Try to hold your breath for 3 seconds. 4. Keep your mouth slightly open while coughing 2 times. 5. Spit any mucus out into a tissue. 6. Rest and do the steps again 1 or 2 times as needed. General instructions  Make sure you get all the shots (vaccines) that your doctor recommends. Ask your doctor about a flu shot and a pneumonia shot.  Use oxygen therapy and therapy to help improve your lungs (pulmonary rehabilitation) if told by your doctor. If you need home oxygen therapy, ask your doctor if you should buy a tool to measure your oxygen level (oximeter).  Make a COPD action plan with your doctor. This helps you know what to do if you feel worse than usual.  Manage any other conditions you have as told by your doctor.  Avoid going outside when it is very hot, cold, or humid.  Avoid people who have a sickness you can catch (contagious).  Keep all follow-up visits as told by your doctor. This is important. Contact a doctor if:  You cough up more mucus than usual.  There is a change in the color or thickness  of the mucus.  It is harder to breathe than usual.  Your breathing is faster than usual.  You have trouble sleeping.  You need to use your medicines more often than usual.  You have trouble doing your normal activities such as getting dressed or walking around the house. Get help right away if:  You have shortness of breath while resting.  You have shortness of breath that stops you from: ? Being able to talk. ? Doing normal activities.  Your chest  hurts for longer than 5 minutes.  Your skin color is more blue than usual.  Your pulse oximeter shows that you have low oxygen for longer than 5 minutes.  You have a fever.  You feel too tired to breathe normally. Summary  Chronic obstructive pulmonary disease (COPD) is a long-term lung problem.  The way your lungs work will never return to normal. Usually the condition gets worse over time. There are things you can do to keep yourself as healthy as possible.  Take over-the-counter and prescription medicines only as told by your doctor.  If you smoke, stop. Smoking makes the problem worse. This information is not intended to replace advice given to you by your health care provider. Make sure you discuss any questions you have with your health care provider. Document Released: 10/11/2007 Document Revised: 09/30/2015 Document Reviewed: 12/19/2012 Elsevier Interactive Patient Education  2017 [employer].

## 2017-02-22 ENCOUNTER — Telehealth: Payer: Self-pay | Admitting: Family Medicine

## 2017-02-22 NOTE — Telephone Encounter (Signed)
Advise that patient that her plan of care did not necessitate for any imaging study. Abdominal pain was not discussed during her last visit. If she is having abdominal pain and would like to be evaluated, she will need to schedule an appointment. However, she will only have imaging performed if my evaluation and her symptoms deems this to be necessary.  Godfrey PickKimberly S. Tiburcio PeaHarris, MSN, FNP-C [facility]  [address] [place], [place] [address] [phone]

## 2017-02-22 NOTE — Telephone Encounter (Signed)
Patient notified

## 2017-02-26 ENCOUNTER — Ambulatory Visit: Payer: Medicaid Other | Admitting: Family Medicine

## 2017-03-06 ENCOUNTER — Emergency Department (HOSPITAL_COMMUNITY)
Admission: EM | Admit: 2017-03-06 | Discharge: 2017-03-06 | Disposition: A | Discharge status: Home / Self Care | Payer: Medicaid Other | Attending: Emergency Medicine | Admitting: Emergency Medicine

## 2017-03-06 ENCOUNTER — Emergency Department (HOSPITAL_COMMUNITY): Payer: Medicaid Other

## 2017-03-06 ENCOUNTER — Encounter (HOSPITAL_COMMUNITY): Payer: Self-pay

## 2017-03-06 DIAGNOSIS — B9789 Other viral agents as the cause of diseases classified elsewhere: Secondary | ICD-10-CM

## 2017-03-06 DIAGNOSIS — B349 Viral infection, unspecified: Secondary | ICD-10-CM | POA: Insufficient documentation

## 2017-03-06 DIAGNOSIS — F17210 Nicotine dependence, cigarettes, uncomplicated: Secondary | ICD-10-CM | POA: Insufficient documentation

## 2017-03-06 DIAGNOSIS — I10 Essential (primary) hypertension: Secondary | ICD-10-CM | POA: Insufficient documentation

## 2017-03-06 DIAGNOSIS — J069 Acute upper respiratory infection, unspecified: Secondary | ICD-10-CM | POA: Insufficient documentation

## 2017-03-06 DIAGNOSIS — Z79899 Other long term (current) drug therapy: Secondary | ICD-10-CM | POA: Insufficient documentation

## 2017-03-06 DIAGNOSIS — R05 Cough: Secondary | ICD-10-CM | POA: Diagnosis present

## 2017-03-06 MED ORDER — NAPROXEN 250 MG PO TABS
500.0000 mg | ORAL_TABLET | Freq: Once | ORAL | Status: AC
  Administered 2017-03-06: 500 mg via ORAL
  Filled 2017-03-06: qty 2

## 2017-03-06 NOTE — ED Notes (Signed)
Pt back from x-ray.

## 2017-03-06 NOTE — Discharge Instructions (Signed)
You can treat her symptoms with Sudafed, Flonase, cough syrups, and Tylenol for pain. Drink lots of fluids, you can use warm teas with honey to help with sore throat and cough. Please continue to use your inhalers as prescribed by her PCP. Follow-up with your PCP if symptoms are not improving by the end of the week. If you have worsening shortness of breath, chest pain, fevers or chills or other new or concerning symptoms develop please return for sooner reevaluation.
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
## 2017-03-06 NOTE — ED Triage Notes (Signed)
Patient complains of cough and congestion x 1 day. Smoker. Alert and oriented, NAD

## 2017-03-12 ENCOUNTER — Ambulatory Visit: Payer: Medicaid Other | Admitting: Family Medicine

## 2017-03-12 ENCOUNTER — Telehealth: Payer: Self-pay

## 2017-03-12 NOTE — Telephone Encounter (Signed)
Could you follow-up with patient to inquire about which medication she wishes refill. I do not see any cholesterol medication needing a refill.   Godfrey PickKimberly S. Tiburcio PeaHarris, MSN, FNP-C [facility]  [address] [place], [place] [address] [phone]

## 2017-03-13 NOTE — Telephone Encounter (Signed)
Left a vm to find out which medication patient needs a refill on

## 2017-03-14 ENCOUNTER — Ambulatory Visit (INDEPENDENT_AMBULATORY_CARE_PROVIDER_SITE_OTHER): Payer: Medicaid Other | Admitting: Family Medicine

## 2017-03-14 ENCOUNTER — Encounter: Payer: Self-pay | Admitting: Family Medicine

## 2017-03-14 VITALS — BP 136/80 | HR 80 | Temp 98.9°F | Resp 16 | Ht 67.0 in | Wt 152.6 lb

## 2017-03-14 DIAGNOSIS — Z5321 Procedure and treatment not carried out due to patient leaving prior to being seen by health care provider: Secondary | ICD-10-CM

## 2017-03-14 MED ORDER — ATORVASTATIN CALCIUM 40 MG PO TABS: 40.0000 mg | ORAL_TABLET | Freq: Every day | ORAL | 3 refills | Status: DC

## 2017-03-14 NOTE — Telephone Encounter (Signed)
Patient is in office today and provider will [facility]
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
## 2017-07-27 ENCOUNTER — Other Ambulatory Visit: Payer: Medicaid Other

## 2017-07-27 ENCOUNTER — Inpatient Hospital Stay: Admission: RE | Admit: 2017-07-27 | Payer: Medicaid Other | Source: Ambulatory Visit

## 2017-08-06 ENCOUNTER — Other Ambulatory Visit: Payer: Self-pay | Admitting: Family Medicine

## 2017-08-06 ENCOUNTER — Ambulatory Visit
Admission: RE | Admit: 2017-08-06 | Discharge: 2017-08-06 | Disposition: A | Discharge status: Home / Self Care | Payer: Medicaid Other | Source: Ambulatory Visit | Attending: Family Medicine | Admitting: Family Medicine

## 2017-08-06 DIAGNOSIS — N6320 Unspecified lump in the left breast, unspecified quadrant: Secondary | ICD-10-CM

## 2017-08-06 DIAGNOSIS — N63 Unspecified lump in unspecified breast: Secondary | ICD-10-CM

## 2017-08-06 DIAGNOSIS — N6310 Unspecified lump in the right breast, unspecified quadrant: Secondary | ICD-10-CM

## 2017-08-16 ENCOUNTER — Emergency Department (HOSPITAL_COMMUNITY)
Admission: EM | Admit: 2017-08-16 | Discharge: 2017-08-16 | Disposition: A | Discharge status: Home / Self Care | Payer: Medicaid Other | Attending: Emergency Medicine | Admitting: Emergency Medicine

## 2017-08-16 ENCOUNTER — Emergency Department (HOSPITAL_COMMUNITY): Payer: Medicaid Other

## 2017-08-16 ENCOUNTER — Encounter (HOSPITAL_COMMUNITY): Payer: Self-pay | Admitting: Emergency Medicine

## 2017-08-16 ENCOUNTER — Other Ambulatory Visit: Payer: Self-pay

## 2017-08-16 DIAGNOSIS — M25511 Pain in right shoulder: Secondary | ICD-10-CM | POA: Diagnosis not present

## 2017-08-16 DIAGNOSIS — Z79899 Other long term (current) drug therapy: Secondary | ICD-10-CM | POA: Diagnosis not present

## 2017-08-16 DIAGNOSIS — M25521 Pain in right elbow: Secondary | ICD-10-CM

## 2017-08-16 DIAGNOSIS — F17210 Nicotine dependence, cigarettes, uncomplicated: Secondary | ICD-10-CM | POA: Diagnosis not present

## 2017-08-16 DIAGNOSIS — F319 Bipolar disorder, unspecified: Secondary | ICD-10-CM | POA: Insufficient documentation

## 2017-08-16 DIAGNOSIS — I10 Essential (primary) hypertension: Secondary | ICD-10-CM | POA: Insufficient documentation

## 2017-08-16 DIAGNOSIS — G8929 Other chronic pain: Secondary | ICD-10-CM | POA: Diagnosis not present

## 2017-08-16 HISTORY — DX: Pain in unspecified shoulder: M25.519

## 2017-08-16 MED ORDER — TIZANIDINE HCL 4 MG PO TABS: 4.0000 mg | ORAL_TABLET | Freq: Four times a day (QID) | ORAL | 0 refills | Status: AC | PRN

## 2017-08-16 MED ORDER — KETOROLAC TROMETHAMINE 15 MG/ML IJ SOLN
30.0000 mg | Freq: Once | INTRAMUSCULAR | Status: AC
  Administered 2017-08-16: 30 mg via INTRAMUSCULAR
  Filled 2017-08-16: qty 2
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
## 2017-08-21 ENCOUNTER — Ambulatory Visit: Payer: Medicaid Other | Admitting: Family Medicine

## 2017-09-03 ENCOUNTER — Encounter (HOSPITAL_COMMUNITY): Payer: Self-pay

## 2017-09-03 ENCOUNTER — Emergency Department (HOSPITAL_COMMUNITY)
Admission: EM | Admit: 2017-09-03 | Discharge: 2017-09-03 | Disposition: A | Discharge status: Home / Self Care | Payer: Medicaid Other | Attending: Emergency Medicine | Admitting: Emergency Medicine

## 2017-09-03 DIAGNOSIS — F17210 Nicotine dependence, cigarettes, uncomplicated: Secondary | ICD-10-CM | POA: Diagnosis not present

## 2017-09-03 DIAGNOSIS — Z79899 Other long term (current) drug therapy: Secondary | ICD-10-CM | POA: Diagnosis not present

## 2017-09-03 DIAGNOSIS — J301 Allergic rhinitis due to pollen: Secondary | ICD-10-CM

## 2017-09-03 DIAGNOSIS — R0981 Nasal congestion: Secondary | ICD-10-CM | POA: Diagnosis present

## 2017-09-03 DIAGNOSIS — I10 Essential (primary) hypertension: Secondary | ICD-10-CM | POA: Diagnosis not present

## 2017-09-03 DIAGNOSIS — J302 Other seasonal allergic rhinitis: Secondary | ICD-10-CM | POA: Insufficient documentation

## 2017-09-03 MED ORDER — LORATADINE 10 MG PO TABS: 10.0000 mg | ORAL_TABLET | Freq: Every day | ORAL | 0 refills | Status: DC

## 2017-09-03 MED ORDER — BENZONATATE 100 MG PO CAPS: 100.0000 mg | ORAL_CAPSULE | Freq: Three times a day (TID) | ORAL | 0 refills | Status: DC

## 2017-09-03 NOTE — Discharge Instructions (Addendum)
Please read attached information. If you experience any new or worsening signs or symptoms please return to the emergency room for evaluation. Please follow-up with your primary care provider or specialist as discussed. Please use medication prescribed only as directed and discontinue taking if you have any concerning signs or symptoms.   °

## 2017-09-03 NOTE — ED Provider Notes (Signed)
[facility] EMERGENCY DEPARTMENT Provider Note   CSN: [id number] Arrival date & time: 09/03/17  1121     History   Chief Complaint Chief Complaint  Patient presents with  . URI    HPI Barbara Alvarez is a 52 y.o. female.  HPI  52 year old female presents today with complaints of upper respiratory congestion.  Patient notes she woke up this morning with itchy watery eyes, dry throat, nasal congestion and facial pain.  Patient notes that she has been taking Claritin as she has seasonal allergies but this has not improved her symptoms.  She notes right sided upper dental pain starting this morning as well.  Patient also notes a dry nonproductive cough over the last week.  She denies any fevers, denies any oral swelling.  She also notes she is been using Flonase for nasal congestion.    Past Medical History:  Diagnosis Date  . Bipolar 1 disorder (HCC)   . Bronchitis   . Chronic back pain   . Chronic dental pain   . Chronic neck pain   . Chronic pain in left foot    since great toe fx  . Claustrophobia   . Headache   . Hearing loss in right ear   . Hypertension   . Shoulder pain     Patient Active Problem List   Diagnosis Date Noted  . Pain in right finger(s) 11/22/2016  . Finger pain, left 11/22/2016  . Chronic right shoulder pain 11/22/2016  . Essential hypertension 05/30/2016  . Right shoulder injury, initial encounter 05/30/2016  . Seasonal allergic rhinitis 05/30/2016  . Muscle spasm of right shoulder 05/30/2016  . Need for immunization against influenza 05/30/2016  . Bipolar disorder (HCC) 05/30/2016  . Acquired mallet finger of right hand 06/15/2015    Past Surgical History:  Procedure Laterality Date  . CHOLECYSTECTOMY    . LIGAMENT REPAIR     left arm     OB History   None      Home Medications    Prior to Admission medications   Medication Sig Start Date End Date Taking? Authorizing Provider  acetaminophen-codeine (TYLENOL  #3) 300-30 MG tablet Take 1-2 tablets by mouth every 6 (six) hours as needed for moderate pain. 12/29/16   Kathryne Hitch, MD  albuterol (PROVENTIL HFA;VENTOLIN HFA) 108 (90 Base) MCG/ACT inhaler Inhale 2 puffs into the lungs every 6 (six) hours as needed for wheezing or shortness of breath. 05/30/16   Tiandre Teall, Tinnie Gens, PA-C  albuterol (PROVENTIL) (2.5 MG/3ML) 0.083% nebulizer solution Take 3 mLs (2.5 mg total) by nebulization every 6 (six) hours as needed for wheezing or shortness of breath. 05/30/16   Amberrose Friebel, Tinnie Gens, PA-C  amoxicillin-clavulanate (AUGMENTIN) 875-125 MG tablet Take 1 tablet by mouth 2 (two) times daily. 02/20/17   Bing Neighbors, FNP  atorvastatin (LIPITOR) 40 MG tablet Take 1 tablet (40 mg total) daily by mouth. 03/14/17   Bing Neighbors, FNP  benzonatate (TESSALON) 100 MG capsule Take 1 capsule (100 mg total) by mouth every 8 (eight) hours. 09/03/17   Keliyah Spillman, Tinnie Gens, PA-C  budesonide-formoterol (SYMBICORT) 80-4.5 MCG/ACT inhaler Inhale 2 puffs into the lungs 2 (two) times daily. 02/20/17   Bing Neighbors, FNP  carbamazepine (EQUETRO) 200 MG CP12 12 hr capsule Take 1 capsule (200 mg total) by mouth at bedtime. 09/07/14   Marlon Pel, PA-C  clonazePAM (KLONOPIN) 1 MG tablet Take 1 mg by mouth 3 (three) times daily as needed for anxiety.  [provider]  diazepam (VALIUM) 5 MG tablet Take one by mouth one hour prior to MRI, repeat as needed Patient not taking: Reported on 02/20/2017 12/26/16   Kathryne Hitch, MD  doxylamine, Sleep, (UNISOM) 25 MG tablet Take 1 tablet (25 mg total) by mouth at bedtime as needed. 02/20/17   Bing Neighbors, FNP  hydrOXYzine (ATARAX/VISTARIL) 25 MG tablet Take 1-2 tablets (25-50 mg total) by mouth every 6 (six) hours as needed for itching. 09/20/16   Bing Neighbors, FNP  lisinopril (PRINIVIL,ZESTRIL) 20 MG tablet Take 1 tablet (20 mg total) by mouth daily. 11/20/16   Bing Neighbors, FNP  loratadine  (CLARITIN) 10 MG tablet Take 1 tablet (10 mg total) by mouth daily. 09/03/17   Gweneth Fredlund, Tinnie Gens, PA-C  meloxicam (MOBIC) 15 MG tablet Take 1 tablet (15 mg total) by mouth daily. Patient not taking: Reported on 02/20/2017 11/20/16   Bing Neighbors, FNP  methocarbamol (ROBAXIN) 500 MG tablet Take 1 tablet (500 mg total) by mouth 4 (four) times daily. Patient not taking: Reported on 02/20/2017 09/13/16   Bing Neighbors, FNP  methylPREDNISolone (MEDROL) 4 MG tablet Medrol dose pack. Take as instructed 11/22/16   Kathryne Hitch, MD  nicotine (NICODERM CQ) 14 mg/24hr patch Place 1 patch (14 mg total) onto the skin daily. Patient not taking: Reported on 02/20/2017 06/10/15   Henrietta Hoover, NP  permethrin (ELIMITE) 5 % cream Apply to affected area once from neck down. Leave on for 8 hours then wash off.  Repeat in 1 week. 05/20/14   Alvira Monday, MD  risperiDONE (RISPERDAL) 0.5 MG tablet Take 0.5 mg by mouth 2 (two) times daily.     [provider]  tiZANidine (ZANAFLEX) 4 MG tablet Take 1 tablet (4 mg total) by mouth every 6 (six) hours as needed for muscle spasms. 08/16/17   Bethel Born, PA-C  traMADol (ULTRAM) 50 MG tablet Take 2 tablets (100 mg total) by mouth 3 (three) times daily as needed. Patient not taking: Reported on 02/20/2017 11/22/16   Kathryne Hitch, MD    Family History Family History  Problem Relation Age of Onset  . Cancer Mother   . Hypertension Father   . Diabetes Other     Social History Social History   Tobacco Use  . Smoking status: Current Every Day Smoker    Packs/day: 0.25    Types: Cigarettes  . Smokeless tobacco: Never Used  Substance Use Topics  . Alcohol use: Yes    Comment: hasn't had drink in about 1 wk  . Drug use: No     Allergies   Haloperidol decanoate; Ibuprofen; Flexeril [cyclobenzaprine]; Naproxen; and Tramadol   Review of Systems Review of Systems  All other systems reviewed and are  negative.  Physical Exam Updated Vital Signs BP (!) 143/78 (BP Location: Right Arm)   Pulse 67   Temp 99.1 F (37.3 C) (Oral)   Resp 16   LMP 09/06/2016   SpO2 100%   Physical Exam  Constitutional: She is oriented to person, place, and time. She appears well-developed and well-nourished.  HENT:  Head: Normocephalic and atraumatic.  Right Ear: Hearing, tympanic membrane and ear canal normal.  Left Ear: Hearing, tympanic membrane and ear canal normal.  Nose: Rhinorrhea present.  Mouth/Throat: Uvula is midline. No oropharyngeal exudate, posterior oropharyngeal edema, posterior oropharyngeal erythema or tonsillar abscesses.  Dentition with numerous carries-gumline palpated no swelling or edema  Eyes: Pupils are equal, round, and  reactive to light. Conjunctivae are normal. Right eye exhibits no discharge. Left eye exhibits no discharge. No scleral icterus.  Neck: Normal range of motion. No JVD present. No tracheal deviation present.  Pulmonary/Chest: Effort normal and breath sounds normal. No stridor. No respiratory distress. She has no wheezes. She has no rales. She exhibits no tenderness.  Neurological: She is alert and oriented to person, place, and time. Coordination normal.  Psychiatric: She has a normal mood and affect. Her behavior is normal. Judgment and thought content normal.  Nursing note and vitals reviewed.   ED Treatments / Results  Labs (all labs ordered are listed, but only abnormal results are displayed) Labs Reviewed - No data to display  EKG None  Radiology No results found.  Procedures Procedures (including critical care time)  Medications Ordered in ED Medications - No data to display   Initial Impression / Assessment and Plan / ED Course  I have reviewed the triage vital signs and the nursing notes.  Pertinent labs & imaging results that were available during my care of the patient were reviewed by me and considered in my medical decision making (see  chart for details).      Final Clinical Impressions(s) / ED Diagnoses   Final diagnoses:  Seasonal allergic rhinitis due to pollen    Labs:   Imaging:  Consults:  Therapeutics:  Discharge Meds: Claritin, Tessalon  Assessment/Plan: 52 year old female presents today with likely allergic rhinitis.  She is well-appearing in no acute distress, encouraged her to use Flonase, Claritin, follow-up with primary care if symptoms persist return if they worsen.  Patient verbalized understanding and agreement to today's plan had no further questions or concerns the time discharge.    ED Discharge Orders        Ordered    loratadine (CLARITIN) 10 MG tablet  Daily     09/03/17 1339    benzonatate (TESSALON) 100 MG capsule  Every 8 hours     09/03/17 1339       Eyvonne Mechanic, PA-C 09/03/17 1339    Rolland Porter, MD 09/08/17 [phone]

## 2017-09-03 NOTE — ED Triage Notes (Signed)
Pt presents for evaluation of URI symptoms. Pt reports cough, nasal congestion, and bilateral eye itching/swelling. Pt reports some dental pain as well.

## 2017-09-03 NOTE — ED Notes (Signed)
Patient verbalized understanding of discharge instructions and denies any further needs or questions at this time. VS stable. Patient ambulatory with steady gait.  

## 2017-12-17 ENCOUNTER — Emergency Department (HOSPITAL_COMMUNITY)
Admission: EM | Admit: 2017-12-17 | Discharge: 2017-12-17 | Disposition: A | Discharge status: Home / Self Care | Payer: Medicaid Other | Attending: Emergency Medicine | Admitting: Emergency Medicine

## 2017-12-17 ENCOUNTER — Encounter (HOSPITAL_COMMUNITY): Payer: Self-pay | Admitting: Emergency Medicine

## 2017-12-17 DIAGNOSIS — Z79899 Other long term (current) drug therapy: Secondary | ICD-10-CM | POA: Insufficient documentation

## 2017-12-17 DIAGNOSIS — R21 Rash and other nonspecific skin eruption: Secondary | ICD-10-CM

## 2017-12-17 DIAGNOSIS — F17210 Nicotine dependence, cigarettes, uncomplicated: Secondary | ICD-10-CM | POA: Insufficient documentation

## 2017-12-17 DIAGNOSIS — H1013 Acute atopic conjunctivitis, bilateral: Secondary | ICD-10-CM | POA: Insufficient documentation

## 2017-12-17 MED ORDER — CETIRIZINE HCL 10 MG PO TABS: 10.0000 mg | ORAL_TABLET | Freq: Every day | ORAL | 0 refills | Status: AC

## 2017-12-17 NOTE — ED Provider Notes (Signed)
[facility]-EMERGENCY DEPT Provider Note   CSN: [id number] Arrival date & time: 12/17/17  4098     History   Chief Complaint Chief Complaint  Patient presents with  . Alopecia  . Facial Swelling    HPI Barbara Alvarez is a 52 y.o. female.  The history is provided by the patient. No language interpreter was used.   Barbara Alvarez is a 52 y.o. female who presents to the Emergency Department complaining of itchy face. Presents to the emergency department for several days of itchy face. She states that two months ago she developed an itchy rash on her face that she has been treating with Noxzema. She has been using Noczema prior to developing the rash. Over the last week she has experienced some swelling around her eyes with burning and itching in her eyes as well with nasal congestion and discharge. She does live in a hotel room states that she has been exposed to insects. She denies any fevers, chest pain, shortness of breath, nausea, vomiting, urinary difficulties, swelling in her legs. Past Medical History:  Diagnosis Date  . Bipolar 1 disorder (HCC)   . Bronchitis   . Chronic back pain   . Chronic dental pain   . Chronic neck pain   . Chronic pain in left foot    since great toe fx  . Claustrophobia   . Headache   . Hearing loss in right ear   . Hypertension   . Shoulder pain     Patient Active Problem List   Diagnosis Date Noted  . Pain in right finger(s) 11/22/2016  . Finger pain, left 11/22/2016  . Chronic right shoulder pain 11/22/2016  . Essential hypertension 05/30/2016  . Right shoulder injury, initial encounter 05/30/2016  . Seasonal allergic rhinitis 05/30/2016  . Muscle spasm of right shoulder 05/30/2016  . Need for immunization against influenza 05/30/2016  . Bipolar disorder (HCC) 05/30/2016  . Acquired mallet finger of right hand 06/15/2015    Past Surgical History:  Procedure Laterality Date  . CHOLECYSTECTOMY    . LIGAMENT  REPAIR     left arm     OB History   None      Home Medications    Prior to Admission medications   Medication Sig Start Date End Date Taking? Authorizing Provider  acetaminophen-codeine (TYLENOL #3) 300-30 MG tablet Take 1-2 tablets by mouth every 6 (six) hours as needed for moderate pain. 12/29/16   Kathryne Hitch, MD  albuterol (PROVENTIL HFA;VENTOLIN HFA) 108 (90 Base) MCG/ACT inhaler Inhale 2 puffs into the lungs every 6 (six) hours as needed for wheezing or shortness of breath. 05/30/16   Hedges, Tinnie Gens, PA-C  albuterol (PROVENTIL) (2.5 MG/3ML) 0.083% nebulizer solution Take 3 mLs (2.5 mg total) by nebulization every 6 (six) hours as needed for wheezing or shortness of breath. 05/30/16   Hedges, Tinnie Gens, PA-C  amoxicillin-clavulanate (AUGMENTIN) 875-125 MG tablet Take 1 tablet by mouth 2 (two) times daily. 02/20/17   Bing Neighbors, FNP  atorvastatin (LIPITOR) 40 MG tablet Take 1 tablet (40 mg total) daily by mouth. 03/14/17   Bing Neighbors, FNP  benzonatate (TESSALON) 100 MG capsule Take 1 capsule (100 mg total) by mouth every 8 (eight) hours. 09/03/17   Hedges, Tinnie Gens, PA-C  budesonide-formoterol (SYMBICORT) 80-4.5 MCG/ACT inhaler Inhale 2 puffs into the lungs 2 (two) times daily. 02/20/17   Bing Neighbors, FNP  carbamazepine (EQUETRO) 200 MG CP12 12 hr capsule Take 1 capsule (  200 mg total) by mouth at bedtime. 09/07/14   Marlon PelGreene, Tiffany, PA-C  cetirizine (ZYRTEC) 10 MG tablet Take 1 tablet (10 mg total) by mouth daily. 12/17/17   Tilden Fossaees, Latash Nouri, MD  clonazePAM (KLONOPIN) 1 MG tablet Take 1 mg by mouth 3 (three) times daily as needed for anxiety.    [provider]  diazepam (VALIUM) 5 MG tablet Take one by mouth one hour prior to MRI, repeat as needed Patient not taking: Reported on 02/20/2017 12/26/16   Kathryne HitchBlackman, Christopher Y, MD  doxylamine, Sleep, (UNISOM) 25 MG tablet Take 1 tablet (25 mg total) by mouth at bedtime as needed. 02/20/17   Bing NeighborsHarris,  Kimberly S, FNP  hydrOXYzine (ATARAX/VISTARIL) 25 MG tablet Take 1-2 tablets (25-50 mg total) by mouth every 6 (six) hours as needed for itching. 09/20/16   Bing NeighborsHarris, Kimberly S, FNP  lisinopril (PRINIVIL,ZESTRIL) 20 MG tablet Take 1 tablet (20 mg total) by mouth daily. 11/20/16   Bing NeighborsHarris, Kimberly S, FNP  loratadine (CLARITIN) 10 MG tablet Take 1 tablet (10 mg total) by mouth daily. 09/03/17   Hedges, Tinnie GensJeffrey, PA-C  meloxicam (MOBIC) 15 MG tablet Take 1 tablet (15 mg total) by mouth daily. Patient not taking: Reported on 02/20/2017 11/20/16   Bing NeighborsHarris, Kimberly S, FNP  methocarbamol (ROBAXIN) 500 MG tablet Take 1 tablet (500 mg total) by mouth 4 (four) times daily. Patient not taking: Reported on 02/20/2017 09/13/16   Bing NeighborsHarris, Kimberly S, FNP  methylPREDNISolone (MEDROL) 4 MG tablet Medrol dose pack. Take as instructed 11/22/16   Kathryne HitchBlackman, Christopher Y, MD  nicotine (NICODERM CQ) 14 mg/24hr patch Place 1 patch (14 mg total) onto the skin daily. Patient not taking: Reported on 02/20/2017 06/10/15   Henrietta HooverBernhardt, Linda C, NP  permethrin (ELIMITE) 5 % cream Apply to affected area once from neck down. Leave on for 8 hours then wash off.  Repeat in 1 week. 05/20/14   Alvira MondaySchlossman, Erin, MD  risperiDONE (RISPERDAL) 0.5 MG tablet Take 0.5 mg by mouth 2 (two) times daily.     [provider]  tiZANidine (ZANAFLEX) 4 MG tablet Take 1 tablet (4 mg total) by mouth every 6 (six) hours as needed for muscle spasms. 08/16/17   Bethel BornGekas, Kelly Marie, PA-C  traMADol (ULTRAM) 50 MG tablet Take 2 tablets (100 mg total) by mouth 3 (three) times daily as needed. Patient not taking: Reported on 02/20/2017 11/22/16   Kathryne HitchBlackman, Christopher Y, MD    Family History Family History  Problem Relation Age of Onset  . Cancer Mother   . Hypertension Father   . Diabetes Other     Social History Social History   Tobacco Use  . Smoking status: Current Every Day Smoker    Packs/day: 0.25    Types: Cigarettes  . Smokeless tobacco:  Never Used  Substance Use Topics  . Alcohol use: Yes    Comment: hasn't had drink in about 1 wk  . Drug use: No     Allergies   Haloperidol decanoate; Ibuprofen; Flexeril [cyclobenzaprine]; Naproxen; and Tramadol   Review of Systems Review of Systems  All other systems reviewed and are negative.    Physical Exam Updated Vital Signs BP 120/79 (BP Location: Left Arm)   Pulse 66   Temp 98.4 F (36.9 C) (Oral)   Resp 16   LMP 09/06/2016   SpO2 100%   Physical Exam  Constitutional: She is oriented to person, place, and time. She appears well-developed and well-nourished.  HENT:  Head: Normocephalic and atraumatic.  TMs clear bilaterally. There is mild peri orbital swelling without erythema. Mild conjunctival irritation. No foreign body on eye exam. OP without erythema or edema.  Eyes: Right eye exhibits no discharge. Left eye exhibits no discharge.  Cardiovascular: Normal rate and regular rhythm.  No murmur heard. Pulmonary/Chest: Effort normal and breath sounds normal. No respiratory distress.  Abdominal: Soft. There is no tenderness. There is no rebound and no guarding.  Musculoskeletal: She exhibits no edema or tenderness.  Neurological: She is alert and oriented to person, place, and time.  Skin: Skin is warm and dry.  Scattered papules on bilateral cheeks without any erythema or induration.  Psychiatric: She has a normal mood and affect. Her behavior is normal.  Nursing note and vitals reviewed.    ED Treatments / Results  Labs (all labs ordered are listed, but only abnormal results are displayed) Labs Reviewed - No data to display  EKG None  Radiology No results found.  Procedures Procedures (including critical care time)  Medications Ordered in ED Medications - No data to display   Initial Impression / Assessment and Plan / ED Course  I have reviewed the triage vital signs and the nursing notes.  Pertinent labs & imaging results that were  available during my care of the patient were reviewed by me and considered in my medical decision making (see chart for details).     Patient here for evaluation of puffy eyes and watery eyes as well as nasal congestion and rash on her face. In terms of her eye swelling and nasal discharge, this is consistent with allergies. No evidence of acute infectious process, foreign body. Presentation is not c/w renal failure or nephrotic syndrome. Discussed home care for allergies. In terms of her facial rash, recommend discontinuing the Noxzema cream. Discussed outpatient follow-up and return precautions. Final Clinical Impressions(s) / ED Diagnoses   Final diagnoses:  Allergic conjunctivitis of both eyes  Rash    ED Discharge Orders         Ordered    cetirizine (ZYRTEC) 10 MG tablet  Daily     12/17/17 1024           Tilden Fossaees, Clyde Upshaw, MD 12/17/17 1624

## 2017-12-17 NOTE — ED Triage Notes (Signed)
Pt reports for a months she has noticed her hair is falling out. Since yesterday having runny nose, congestion and eye watery and burning. Reports she cant sleep so pain is 10/10. Having to wear shades due to eyes pain.

## 2018-02-06 ENCOUNTER — Other Ambulatory Visit: Payer: Medicaid Other

## 2018-02-06 ENCOUNTER — Inpatient Hospital Stay: Admission: RE | Admit: 2018-02-06 | Payer: Medicaid Other | Source: Ambulatory Visit

## 2018-02-13 ENCOUNTER — Ambulatory Visit (INDEPENDENT_AMBULATORY_CARE_PROVIDER_SITE_OTHER): Payer: Self-pay | Admitting: Orthopaedic Surgery

## 2018-04-03 ENCOUNTER — Ambulatory Visit (INDEPENDENT_AMBULATORY_CARE_PROVIDER_SITE_OTHER): Payer: Self-pay | Admitting: Physician Assistant

## 2018-05-19 ENCOUNTER — Emergency Department (HOSPITAL_COMMUNITY)
Admission: EM | Admit: 2018-05-19 | Discharge: 2018-05-19 | Disposition: A | Discharge status: Home / Self Care | Payer: Medicaid Other | Attending: Emergency Medicine | Admitting: Emergency Medicine

## 2018-05-19 ENCOUNTER — Emergency Department (HOSPITAL_COMMUNITY): Payer: Medicaid Other

## 2018-05-19 ENCOUNTER — Encounter (HOSPITAL_COMMUNITY): Payer: Self-pay

## 2018-05-19 DIAGNOSIS — I10 Essential (primary) hypertension: Secondary | ICD-10-CM | POA: Diagnosis not present

## 2018-05-19 DIAGNOSIS — Z79899 Other long term (current) drug therapy: Secondary | ICD-10-CM | POA: Insufficient documentation

## 2018-05-19 DIAGNOSIS — Y999 Unspecified external cause status: Secondary | ICD-10-CM | POA: Diagnosis not present

## 2018-05-19 DIAGNOSIS — S93602A Unspecified sprain of left foot, initial encounter: Secondary | ICD-10-CM | POA: Diagnosis not present

## 2018-05-19 DIAGNOSIS — F17210 Nicotine dependence, cigarettes, uncomplicated: Secondary | ICD-10-CM | POA: Insufficient documentation

## 2018-05-19 DIAGNOSIS — Y9259 Other trade areas as the place of occurrence of the external cause: Secondary | ICD-10-CM | POA: Insufficient documentation

## 2018-05-19 DIAGNOSIS — S99922A Unspecified injury of left foot, initial encounter: Secondary | ICD-10-CM | POA: Diagnosis present

## 2018-05-19 DIAGNOSIS — W228XXA Striking against or struck by other objects, initial encounter: Secondary | ICD-10-CM | POA: Insufficient documentation

## 2018-05-19 DIAGNOSIS — Y9301 Activity, walking, marching and hiking: Secondary | ICD-10-CM | POA: Insufficient documentation

## 2018-05-19 MED ORDER — OXYCODONE-ACETAMINOPHEN 5-325 MG PO TABS
1.0000 | ORAL_TABLET | Freq: Once | ORAL | Status: AC
  Administered 2018-05-19: 1 via ORAL
  Filled 2018-05-19: qty 1

## 2018-05-19 NOTE — ED Triage Notes (Signed)
Pt hit right foot on furniture today c/o swelling and pain.

## 2018-05-19 NOTE — Discharge Instructions (Signed)
Please rest, ice and elevate the foot to help with pain Use the crutches until you are able to walk without severe pain Take over the counter medicines for pain as needed

## 2018-05-19 NOTE — ED Provider Notes (Signed)
[facility] EMERGENCY DEPARTMENT Provider Note   CSN: [id number] Arrival date & time: 05/19/18  1531     History   Chief Complaint Chief Complaint  Patient presents with  . Foot Pain    HPI Barbara Alvarez is a 53 y.o. female who presents with right foot injury. PMH significant for chronic pain syndrome, bipolar d/o, HTN. She states she's been living in a hotel and got up to turn the heat off this morning because it's so warm and she stubbed her foot on a stool. She had severe pain in her foot that radiates from her 2nd toe to her ankle and the bottom of her foot. She felt a crack when it happened. It's getting hard to walk on. Nothing makes it better or worse. No numbness or tingling. She tried to ice it but it didn't help.  HPI  Past Medical History:  Diagnosis Date  . Bipolar 1 disorder (HCC)   . Bronchitis   . Chronic back pain   . Chronic dental pain   . Chronic neck pain   . Chronic pain in left foot    since great toe fx  . Claustrophobia   . Headache   . Hearing loss in right ear   . Hypertension   . Shoulder pain     Patient Active Problem List   Diagnosis Date Noted  . Pain in right finger(s) 11/22/2016  . Finger pain, left 11/22/2016  . Chronic right shoulder pain 11/22/2016  . Essential hypertension 05/30/2016  . Right shoulder injury, initial encounter 05/30/2016  . Seasonal allergic rhinitis 05/30/2016  . Muscle spasm of right shoulder 05/30/2016  . Need for immunization against influenza 05/30/2016  . Bipolar disorder (HCC) 05/30/2016  . Acquired mallet finger of right hand 06/15/2015    Past Surgical History:  Procedure Laterality Date  . CHOLECYSTECTOMY    . LIGAMENT REPAIR     left arm     OB History   No obstetric history on file.      Home Medications    Prior to Admission medications   Medication Sig Start Date End Date Taking? Authorizing Provider  acetaminophen-codeine (TYLENOL #3) 300-30 MG tablet Take 1-2  tablets by mouth every 6 (six) hours as needed for moderate pain. 12/29/16   Kathryne Hitch, MD  albuterol (PROVENTIL HFA;VENTOLIN HFA) 108 (90 Base) MCG/ACT inhaler Inhale 2 puffs into the lungs every 6 (six) hours as needed for wheezing or shortness of breath. 05/30/16   Hedges, Tinnie Gens, PA-C  albuterol (PROVENTIL) (2.5 MG/3ML) 0.083% nebulizer solution Take 3 mLs (2.5 mg total) by nebulization every 6 (six) hours as needed for wheezing or shortness of breath. 05/30/16   Hedges, Tinnie Gens, PA-C  amoxicillin-clavulanate (AUGMENTIN) 875-125 MG tablet Take 1 tablet by mouth 2 (two) times daily. 02/20/17   Bing Neighbors, FNP  atorvastatin (LIPITOR) 40 MG tablet Take 1 tablet (40 mg total) daily by mouth. 03/14/17   Bing Neighbors, FNP  benzonatate (TESSALON) 100 MG capsule Take 1 capsule (100 mg total) by mouth every 8 (eight) hours. 09/03/17   Hedges, Tinnie Gens, PA-C  budesonide-formoterol (SYMBICORT) 80-4.5 MCG/ACT inhaler Inhale 2 puffs into the lungs 2 (two) times daily. 02/20/17   Bing Neighbors, FNP  carbamazepine (EQUETRO) 200 MG CP12 12 hr capsule Take 1 capsule (200 mg total) by mouth at bedtime. 09/07/14   Marlon Pel, PA-C  cetirizine (ZYRTEC) 10 MG tablet Take 1 tablet (10 mg total) by mouth daily. 12/17/17  Tilden Fossaees, Elizabeth, MD  clonazePAM (KLONOPIN) 1 MG tablet Take 1 mg by mouth 3 (three) times daily as needed for anxiety.    [provider]  diazepam (VALIUM) 5 MG tablet Take one by mouth one hour prior to MRI, repeat as needed Patient not taking: Reported on 02/20/2017 12/26/16   Kathryne HitchBlackman, Christopher Y, MD  doxylamine, Sleep, (UNISOM) 25 MG tablet Take 1 tablet (25 mg total) by mouth at bedtime as needed. 02/20/17   Bing NeighborsHarris, Kimberly S, FNP  hydrOXYzine (ATARAX/VISTARIL) 25 MG tablet Take 1-2 tablets (25-50 mg total) by mouth every 6 (six) hours as needed for itching. 09/20/16   Bing NeighborsHarris, Kimberly S, FNP  lisinopril (PRINIVIL,ZESTRIL) 20 MG tablet Take 1 tablet (20 mg  total) by mouth daily. 11/20/16   Bing NeighborsHarris, Kimberly S, FNP  loratadine (CLARITIN) 10 MG tablet Take 1 tablet (10 mg total) by mouth daily. 09/03/17   Hedges, Tinnie GensJeffrey, PA-C  meloxicam (MOBIC) 15 MG tablet Take 1 tablet (15 mg total) by mouth daily. Patient not taking: Reported on 02/20/2017 11/20/16   Bing NeighborsHarris, Kimberly S, FNP  methocarbamol (ROBAXIN) 500 MG tablet Take 1 tablet (500 mg total) by mouth 4 (four) times daily. Patient not taking: Reported on 02/20/2017 09/13/16   Bing NeighborsHarris, Kimberly S, FNP  methylPREDNISolone (MEDROL) 4 MG tablet Medrol dose pack. Take as instructed 11/22/16   Kathryne HitchBlackman, Christopher Y, MD  nicotine (NICODERM CQ) 14 mg/24hr patch Place 1 patch (14 mg total) onto the skin daily. Patient not taking: Reported on 02/20/2017 06/10/15   Henrietta HooverBernhardt, Linda C, NP  permethrin (ELIMITE) 5 % cream Apply to affected area once from neck down. Leave on for 8 hours then wash off.  Repeat in 1 week. 05/20/14   Alvira MondaySchlossman, Erin, MD  risperiDONE (RISPERDAL) 0.5 MG tablet Take 0.5 mg by mouth 2 (two) times daily.     [provider]  tiZANidine (ZANAFLEX) 4 MG tablet Take 1 tablet (4 mg total) by mouth every 6 (six) hours as needed for muscle spasms. 08/16/17   Bethel BornGekas, Kao Conry Marie, PA-C  traMADol (ULTRAM) 50 MG tablet Take 2 tablets (100 mg total) by mouth 3 (three) times daily as needed. Patient not taking: Reported on 02/20/2017 11/22/16   Kathryne HitchBlackman, Christopher Y, MD    Family History Family History  Problem Relation Age of Onset  . Cancer Mother   . Hypertension Father   . Diabetes Other     Social History Social History   Tobacco Use  . Smoking status: Current Every Day Smoker    Packs/day: 0.25    Types: Cigarettes  . Smokeless tobacco: Never Used  Substance Use Topics  . Alcohol use: Yes    Comment: hasn't had drink in about 1 wk  . Drug use: No     Allergies   Haloperidol decanoate; Ibuprofen; Flexeril [cyclobenzaprine]; Naproxen; and Tramadol   Review of  Systems Review of Systems  Musculoskeletal: Positive for arthralgias. Negative for joint swelling.  Skin: Positive for color change. Negative for wound.     Physical Exam Updated Vital Signs BP (!) 149/80 (BP Location: Right Arm)   Pulse 67   Temp 98.3 F (36.8 C) (Oral)   Resp 18   Ht 5\' 6"  (1.676 m)   Wt 77.1 kg   LMP 09/06/2016   SpO2 100%   BMI 27.44 kg/m   Physical Exam Vitals signs and nursing note reviewed.  Constitutional:      General: She is not in acute distress.    Appearance: Normal  appearance. She is well-developed.     Comments: Tearful  HENT:     Head: Normocephalic and atraumatic.  Eyes:     General: No scleral icterus.       Right eye: No discharge.        Left eye: No discharge.     Conjunctiva/sclera: Conjunctivae normal.     Pupils: Pupils are equal, round, and reactive to light.  Neck:     Musculoskeletal: Normal range of motion.  Cardiovascular:     Rate and Rhythm: Normal rate.  Pulmonary:     Effort: Pulmonary effort is normal. No respiratory distress.  Abdominal:     General: There is no distension.  Musculoskeletal:     Comments: Right foot: Slight bruising over the second toe. Pain is somewhat out of proportion to exam. Diffuse tenderness of the foot. Achilles intact. 2+DP pulse  Skin:    General: Skin is warm and dry.  Neurological:     Mental Status: She is alert and oriented to person, place, and time.  Psychiatric:        Behavior: Behavior normal.      ED Treatments / Results  Labs (all labs ordered are listed, but only abnormal results are displayed) Labs Reviewed - No data to display  EKG None  Radiology Dg Foot Complete Right  Result Date: 05/19/2018 CLINICAL DATA:  Foot pain and swelling after injury. EXAM: RIGHT FOOT COMPLETE - 3+ VIEW COMPARISON:  Right foot x-rays dated August 19, 2007. FINDINGS: There is no evidence of fracture or dislocation. There is no evidence of arthropathy or other focal bone abnormality.  Soft tissues are unremarkable. IMPRESSION: Negative. Electronically Signed   By: Obie DredgeWilliam T Derry M.D.   On: 05/19/2018 17:30    Procedures Procedures (including critical care time)  Medications Ordered in ED Medications  oxyCODONE-acetaminophen (PERCOCET/ROXICET) 5-325 MG per tablet 1 tablet (1 tablet Oral Given 05/19/18 1615)     Initial Impression / Assessment and Plan / ED Course  I have reviewed the triage vital signs and the nursing notes.  Pertinent labs & imaging results that were available during my care of the patient were reviewed by me and considered in my medical decision making (see chart for details).  53 year old female presents with right foot pain after stubbing her foot on a stool earlier today.  She has mild bruising over the second toe..  Pain somewhat out of proportion to exam.  X-ray is negative for fracture.  Discussed results with patient.  Advised rest, ice, elevation.  We will give her crutches and Ace wrap and she states she is been unable to bear weight.  She was given return precautions.  Final Clinical Impressions(s) / ED Diagnoses   Final diagnoses:  Sprain of left foot, initial encounter    ED Discharge Orders    None       Bethel BornGekas, Raizel Wesolowski Marie, PA-C 05/19/18 1934    Sabas SousBero, Michael M, MD 05/20/18 1034

## 2018-05-19 NOTE — ED Notes (Signed)
Pt stable, ambulatory, states understanding of discharge instructions 

## 2018-05-19 NOTE — ED Notes (Signed)
Patient transported to X-ray 

## 2018-05-27 ENCOUNTER — Ambulatory Visit (INDEPENDENT_AMBULATORY_CARE_PROVIDER_SITE_OTHER): Payer: Medicaid Other | Admitting: Physician Assistant

## 2018-05-27 ENCOUNTER — Ambulatory Visit (INDEPENDENT_AMBULATORY_CARE_PROVIDER_SITE_OTHER): Payer: Medicaid Other

## 2018-05-27 ENCOUNTER — Encounter (INDEPENDENT_AMBULATORY_CARE_PROVIDER_SITE_OTHER): Payer: Self-pay | Admitting: Physician Assistant

## 2018-05-27 VITALS — Ht 66.0 in | Wt 170.0 lb

## 2018-05-27 DIAGNOSIS — M79672 Pain in left foot: Secondary | ICD-10-CM

## 2018-05-27 DIAGNOSIS — M25562 Pain in left knee: Secondary | ICD-10-CM

## 2018-05-27 NOTE — Progress Notes (Signed)
Office Visit Note   Patient: Barbara Alvarez           Date of Birth: Aug 13, 1965           MRN: [id number] Visit Date: 05/27/2018              Requested by: Leilani Able, MD [address] [place], [place] [address] PCP: Leilani Able, MD   Assessment & Plan: Visit Diagnoses:  1. Left knee pain, unspecified chronicity   2. Pain in left foot     Plan: We will have her weight-bear as tolerated in a postop shoe taping the buddy taping the first through third toes together to splint the second toe.  In regards to her knee pain have her follow-up in 2 weeks check her response to the injection.  Follow-Up Instructions: Return in about 2 weeks (around 06/10/2018).   Orders:  Orders Placed This Encounter  Procedures  . XR Knee 1-2 Views Left   No orders of the defined types were placed in this encounter.     Procedures: No procedures performed   Clinical Data: No additional findings.   Subjective: Chief Complaint  Patient presents with  . Right Leg - Pain    HPI Barbara Alvarez comes in today due to left knee pain and right foot pain.  She was recently seen in the ER on 05/19/2018 due to stubbing her right foot on stool.  She is mainly complaining of second toe pain.  She had radiographs in the ER which I personally reviewed 3 views of the foot shows no acute fractures no bony abnormalities.  She was given crutches and Ace wrap.  She is also having left knee pain this been ongoing for some time.  She states that her pains been ongoing at least since she was seen by Dr. Magnus Alvarez for her shoulder in August 2018.  No particular injury to the knee.  States her pain is 9 out of 10 pain.  She is been taking Motrin for the pain.  No mechanical symptoms of the knee. Review of Systems Please see HPI otherwise negative  Objective: Vital Signs: Ht 5\' 6"  (1.676 m)   Wt 170 lb (77.1 kg)   LMP 09/06/2016   BMI 27.44 kg/m   Physical Exam Constitutional:      Appearance: She is not  ill-appearing or diaphoretic.  Pulmonary:     Effort: Pulmonary effort is normal.  Neurological:     Mental Status: She is alert and oriented to person, place, and time.  Psychiatric:        Mood and Affect: Mood normal.     Ortho Exam Bilateral knees no effusion abnormal warmth.  No instability valgus varus stressing of either knee..  Tenderness inferior pole of patella left knee.  Good range of motion of the both knees otherwise.  Right foot dorsal pedal pulse intact.  She has tenderness and edema of the second toe.  Compared to the left foot she may have a slight extension of the second toe compared to the left 2nd toe.    Specialty Comments:  No specialty comments available.  Imaging: Xr Knee 1-2 Views Left  Result Date: 05/27/2018 AP and lateral views left knee: No acute fracture.  Slight narrowing of the medial joint line.  Otherwise is well-preserved.    PMFS History: Patient Active Problem List   Diagnosis Date Noted  . Pain in right finger(s) 11/22/2016  . Finger pain, left 11/22/2016  . Chronic right shoulder  pain 11/22/2016  . Essential hypertension 05/30/2016  . Right shoulder injury, initial encounter 05/30/2016  . Seasonal allergic rhinitis 05/30/2016  . Muscle spasm of right shoulder 05/30/2016  . Need for immunization against influenza 05/30/2016  . Bipolar disorder (HCC) 05/30/2016  . Acquired mallet finger of right hand 06/15/2015   Past Medical History:  Diagnosis Date  . Bipolar 1 disorder (HCC)   . Bronchitis   . Chronic back pain   . Chronic dental pain   . Chronic neck pain   . Chronic pain in left foot    since great toe fx  . Claustrophobia   . Headache   . Hearing loss in right ear   . Hypertension   . Shoulder pain     Family History  Problem Relation Age of Onset  . Cancer Mother   . Hypertension Father   . Diabetes Other     Past Surgical History:  Procedure Laterality Date  . CHOLECYSTECTOMY    . LIGAMENT REPAIR     left arm    Social History   Occupational History  . Not on file  Tobacco Use  . Smoking status: Current Every Day Smoker    Packs/day: 0.25    Types: Cigarettes  . Smokeless tobacco: Never Used  Substance and Sexual Activity  . Alcohol use: Yes    Comment: hasn't had drink in about 1 wk  . Drug use: No  . Sexual activity: Not on file

## 2018-05-28 ENCOUNTER — Telehealth (INDEPENDENT_AMBULATORY_CARE_PROVIDER_SITE_OTHER): Payer: Self-pay | Admitting: Physician Assistant

## 2018-05-28 NOTE — Telephone Encounter (Signed)
Pt called saying she received a shot yesterday in your left knee and pt said that the pain is radiating up her whole left side. Pt # [phone]

## 2018-05-28 NOTE — Telephone Encounter (Signed)
See below

## 2018-05-29 NOTE — Telephone Encounter (Signed)
Tried to call patient there was no answer on her phone.  Would recommend ice and Tylenol she is unable take NSAIDs.  She can follow-up as needed.

## 2018-05-30 NOTE — Telephone Encounter (Signed)
LMOM for patient of the below message  

## 2018-05-31 ENCOUNTER — Telehealth (INDEPENDENT_AMBULATORY_CARE_PROVIDER_SITE_OTHER): Payer: Self-pay | Admitting: Physician Assistant

## 2018-05-31 NOTE — Telephone Encounter (Signed)
See below

## 2018-05-31 NOTE — Telephone Encounter (Signed)
Patient called advised she is taking Tylenol, Ibuprofen,using cold packs on the back of her thigh and knee. Patient said nothing is working and want to know what else she can do? The number to contact patient is [phone]

## 2018-06-03 ENCOUNTER — Other Ambulatory Visit (INDEPENDENT_AMBULATORY_CARE_PROVIDER_SITE_OTHER): Payer: Self-pay

## 2018-06-03 DIAGNOSIS — G8929 Other chronic pain: Secondary | ICD-10-CM

## 2018-06-03 DIAGNOSIS — M25562 Pain in left knee: Principal | ICD-10-CM

## 2018-06-03 NOTE — Telephone Encounter (Signed)
Please advise Patient called advised she is taking Tylenol, Ibuprofen,using cold packs on the back of her thigh and knee. Patient said nothing is working and want to know what else she can do? The number to contact patient is [phone]

## 2018-06-03 NOTE — Telephone Encounter (Signed)
Please order a MRI of her knee to rule out a tear given her continued pain.  Nothing else to really do.

## 2018-06-10 ENCOUNTER — Ambulatory Visit (INDEPENDENT_AMBULATORY_CARE_PROVIDER_SITE_OTHER): Payer: Medicaid Other | Admitting: Physician Assistant

## 2018-06-19 ENCOUNTER — Other Ambulatory Visit: Payer: Medicaid Other

## 2018-06-26 ENCOUNTER — Ambulatory Visit (INDEPENDENT_AMBULATORY_CARE_PROVIDER_SITE_OTHER): Payer: Medicaid Other | Admitting: Physician Assistant

## 2018-06-28 ENCOUNTER — Ambulatory Visit
Admission: RE | Admit: 2018-06-28 | Discharge: 2018-06-28 | Disposition: A | Discharge status: Home / Self Care | Payer: Medicaid Other | Source: Ambulatory Visit | Attending: Orthopaedic Surgery | Admitting: Orthopaedic Surgery

## 2018-06-28 DIAGNOSIS — M25562 Pain in left knee: Principal | ICD-10-CM

## 2018-06-28 DIAGNOSIS — G8929 Other chronic pain: Secondary | ICD-10-CM

## 2018-07-04 ENCOUNTER — Ambulatory Visit (INDEPENDENT_AMBULATORY_CARE_PROVIDER_SITE_OTHER): Payer: Medicaid Other | Admitting: Physician Assistant

## 2018-07-15 ENCOUNTER — Ambulatory Visit (INDEPENDENT_AMBULATORY_CARE_PROVIDER_SITE_OTHER): Payer: Medicaid Other | Admitting: Physician Assistant

## 2018-10-07 ENCOUNTER — Ambulatory Visit: Payer: Medicaid Other | Admitting: Physician Assistant

## 2018-10-30 ENCOUNTER — Ambulatory Visit (INDEPENDENT_AMBULATORY_CARE_PROVIDER_SITE_OTHER): Payer: Medicaid Other | Admitting: Physician Assistant

## 2018-10-30 ENCOUNTER — Encounter: Payer: Self-pay | Admitting: Physician Assistant

## 2018-10-30 ENCOUNTER — Other Ambulatory Visit: Payer: Self-pay

## 2018-10-30 ENCOUNTER — Ambulatory Visit (INDEPENDENT_AMBULATORY_CARE_PROVIDER_SITE_OTHER): Payer: Medicaid Other

## 2018-10-30 DIAGNOSIS — M5440 Lumbago with sciatica, unspecified side: Secondary | ICD-10-CM | POA: Diagnosis not present
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
## 2018-10-31 ENCOUNTER — Other Ambulatory Visit: Payer: Self-pay

## 2018-10-31 DIAGNOSIS — M4807 Spinal stenosis, lumbosacral region: Secondary | ICD-10-CM

## 2018-11-29 ENCOUNTER — Telehealth: Payer: Self-pay

## 2018-11-29 NOTE — Telephone Encounter (Signed)
Tried calling patient. No answer. LMVM with all detail below per Dr Ninfa Linden Sending to you as FYI just in case she calls back.

## 2018-11-29 NOTE — Telephone Encounter (Signed)
Patient called and was very upset about her MRI not being approved. She said that it is ridiculous that she has been having this problem for years and it gets denied without peer to peer being done.   Barbara Alvarez said that you had a message for a peer to peer. Could you please do this so we can try to get the scan approved? I tried to explain all that was going on to patient and she hung up on me.

## 2018-11-29 NOTE — Telephone Encounter (Signed)
You can let her know that I actually did check into a peer to peer review, but according to her insurance, she would still need at least a visit with physical therapy prior to approving the MRI.  See if she would be willing to see out patient physical therapy so they can see her for her back and have any documentation of treatment for her back pain.  That will then allow [place] to be able to get her to qualify for an MRI if she does not have success with any physical therapy.  Our hands are tied otherwise.  We will get MRI of her left knee earlier this year due to severe pain and problems she was having with that knee and the MRI ended up being completely normal.  That is probably why also the insurance company is hesitating with allowing for the MRI.  She needs to try least 1 or 2 therapy sessions so we can document that and then have better information for getting an MRI approved.

## 2018-11-30 ENCOUNTER — Other Ambulatory Visit: Payer: Medicaid Other

## 2018-12-02 ENCOUNTER — Telehealth: Payer: Self-pay | Admitting: Orthopaedic Surgery

## 2018-12-02 NOTE — Telephone Encounter (Signed)
Patient called advised medicaid only allowed 1 visit for (PT) last year back in May at [address] Patient asked for a call back to discuss. The number to contact patient is [phone]

## 2018-12-06 ENCOUNTER — Telehealth: Payer: Self-pay | Admitting: Orthopaedic Surgery

## 2018-12-06 NOTE — Telephone Encounter (Signed)
Called patient back and LMOM for patient asking her to leave me a detailed message about what she may need

## 2018-12-06 NOTE — Telephone Encounter (Signed)
Patient called asked when can she expect a call back concerning her note that was left on 12/02/2018? Please see previous note.The number to contact patient is [phone]

## 2018-12-11 ENCOUNTER — Other Ambulatory Visit: Payer: Self-pay

## 2018-12-11 DIAGNOSIS — G8929 Other chronic pain: Secondary | ICD-10-CM

## 2018-12-11 NOTE — Telephone Encounter (Signed)
Patient called back stating that she was approved for PT through Medicaid and is still hurting.  She is wanting to know how to get Medicaid to approve her for another PT visit.   It may be that we need to do another authorization to get Medicaid to approve her for the PT.  Thank you.

## 2018-12-11 NOTE — Telephone Encounter (Signed)
Sent order to PT 

## 2018-12-18 ENCOUNTER — Ambulatory Visit: Payer: Medicaid Other | Admitting: Physical Therapy

## 2018-12-25 ENCOUNTER — Ambulatory Visit: Payer: Medicaid Other | Attending: Orthopaedic Surgery | Admitting: Physical Therapy

## 2018-12-25 ENCOUNTER — Other Ambulatory Visit: Payer: Self-pay

## 2018-12-25 DIAGNOSIS — G8929 Other chronic pain: Secondary | ICD-10-CM | POA: Diagnosis present

## 2018-12-25 DIAGNOSIS — M545 Low back pain, unspecified: Secondary | ICD-10-CM

## 2018-12-25 DIAGNOSIS — M25511 Pain in right shoulder: Secondary | ICD-10-CM | POA: Insufficient documentation

## 2018-12-25 DIAGNOSIS — M546 Pain in thoracic spine: Secondary | ICD-10-CM | POA: Insufficient documentation

## 2018-12-29 ENCOUNTER — Encounter: Payer: Self-pay | Admitting: Physical Therapy
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
## 2018-12-29 NOTE — Patient Instructions (Signed)
Access Code: [email or web address]  URL: [email or web address]  Date: 12/25/2018  Prepared by: Lyndee Hensen   Exercises Single Knee to Chest Stretch - 3 reps - 30 hold - 2x daily Supine Lower Trunk Rotation - 10 reps - 5 hold - 2x daily Supine Shoulder Flexion AAROM with Hands Clasped - 10 reps - 1 sets - 5 hold - 2x daily Seated Scapular Retraction - 10 reps - 1 sets - 2x daily

## 2019-01-01 ENCOUNTER — Encounter: Payer: Self-pay | Admitting: Physical Therapy

## 2019-01-01 ENCOUNTER — Other Ambulatory Visit: Payer: Self-pay

## 2019-01-01 ENCOUNTER — Ambulatory Visit: Payer: Medicaid Other | Admitting: Physical Therapy

## 2019-01-01 DIAGNOSIS — M546 Pain in thoracic spine: Secondary | ICD-10-CM | POA: Diagnosis not present

## 2019-01-01 DIAGNOSIS — G8929 Other chronic pain: Secondary | ICD-10-CM

## 2019-01-01 NOTE — Therapy (Signed)
[facility] [address] [place], [place], [address] Phone: [phone]   Fax:  [phone]  Physical Therapy Treatment  Patient Details  Name: Marijean Bravoloise B Buck MRN: [id number] Date of Birth: 05-Jun-1965 Referring Provider (PT): Dr. Magnus IvanBlackman   Encounter Date: 01/01/2019  PT End of Session - 01/01/19 0851    Visit Number  2    Number of Visits  4    Date for PT Re-Evaluation  02/05/19    Authorization Type  Medicaid    PT Start Time  0831    PT Stop Time  0910    PT Time Calculation (min)  39 min    Activity Tolerance  Patient limited by pain    Behavior During Therapy  [facility] for tasks assessed/performed;Agitated       Past Medical History:  Diagnosis Date  . Bipolar 1 disorder (HCC)   . Bronchitis   . Chronic back pain   . Chronic dental pain   . Chronic neck pain   . Chronic pain in left foot    since great toe fx  . Claustrophobia   . Headache   . Hearing loss in right ear   . Hypertension   . Shoulder pain     Past Surgical History:  Procedure Laterality Date  . CHOLECYSTECTOMY    . LIGAMENT REPAIR     left arm    There were no vitals filed for this visit.  Subjective Assessment - 01/01/19 0850    Subjective  Pt states that she has been doing HEP, "it hurts, but im doing it".    Limitations  Sitting;Lifting;Standing;House hold activities    Currently in Pain?  Yes    Pain Score  8     Pain Location  Back    Pain Orientation  Right;Mid;Upper    Pain Descriptors / Indicators  Sore    Pain Type  Chronic pain    Pain Onset  More than a month ago    Pain Frequency  Intermittent                       [facility] Adult PT Treatment/Exercise - 01/01/19 [id number]      Posture/Postural Control   Posture Comments  Seated: R trunk SB, and R shoulder protraction, due to pain       Exercises   Exercises  Lumbar      Lumbar Exercises: Stretches   Single Knee to Chest Stretch  2 reps;30  seconds;Right;Left    Lower Trunk Rotation  10 seconds;5 reps    Other Lumbar Stretch Exercise  Standing QL stretch 30 sec x3 bil;       Lumbar Exercises: Aerobic   Nustep  L1, arms at 14, x 8 min;       Lumbar Exercises: Standing   Row  20 reps    Theraband Level (Row)  Level 2 (Red)    Other Standing Lumbar Exercises  shoulder ladder x3 bilateral;        Lumbar Exercises: Seated   Other Seated Lumbar Exercises  scap retraction x10      Lumbar Exercises: Supine   Other Supine Lumbar Exercises  Supine: Bil shoulder AAROM , CP x10;  Flexion x10 with cane ;  Horizontal abd x10;                PT Short Term Goals - 12/29/18 1411      PT SHORT TERM GOAL #1  Title  Pt to report pain to be 8/10 in R side of Back    Baseline  Pain 10/10    Time  2    Period  Weeks    Status  New    Target Date  01/08/19      PT SHORT TERM GOAL #2   Title  Pt to demo ability to achieve neutral seated posture, without cuing for improved alignment and pain    Baseline  Pt sitting with significnat R trunk lean/side bending, and R shoulder protraction. , due to pain    Time  2    Period  Weeks    Status  New    Target Date  01/08/19        PT Long Term Goals - 12/29/18 1418      PT LONG TERM GOAL #1   Title  Pt to report pain in back to be 0-4/10 with activity, to improve ability for ADLS. and IADLS    Baseline  Pain 10/10    Time  6    Period  Weeks    Status  New    Target Date  02/05/19      PT LONG TERM GOAL #2   Title  Pt to demo improved lumbar ROM to be [facility], to improve ability for IADLS and transfers.    Baseline  Significant limitation with lumbar mobility, and pain    Time  6    Period  Weeks    Status  New    Target Date  02/05/19      PT LONG TERM GOAL #3   Title  Pt to demo improved ROM of R shoulder to be [facility], with pain 0-3/10 to improve ability for reaching, lifing and ADLs.    Baseline  Flexion ROM: 130, with pain up to 8/10    Time  6    Period  Weeks     Status  New    Target Date  02/05/19            Plan - 01/01/19 0905    Clinical Impression Statement  Pt with improved posture, and improved ability for movement today, from previous session. Pt with soreness, but has less pain with most all motions today than at Eval. Pt able to progress activity today. Plan to progress as tolerated.    Personal Factors and Comorbidities  Behavior Pattern;Time since onset of injury/illness/exacerbation    Examination-Activity Limitations  Sit;Sleep;Bend;Stand;Carry;Transfers;Lift;Locomotion Level;Reach Overhead    Examination-Participation Restrictions  Cleaning;Shop;Community Activity    Stability/Clinical Decision Making  Evolving/Moderate complexity    Rehab Potential  Fair    PT Frequency  1x / week    PT Duration  6 weeks    PT Treatment/Interventions  ADLs/Self Care Home Management;Cryotherapy;Electrical Stimulation;Iontophoresis 4mg /ml Dexamethasone;Moist Heat;Traction;Therapeutic exercise;Therapeutic activities;Functional mobility training;Stair training;Ultrasound;Neuromuscular re-education;Patient/family education;Manual techniques;Taping;Vasopneumatic Device;Dry needling;Passive range of motion;Spinal Manipulations;Joint Manipulations    PT Next Visit Plan  seated posture for R shoulder and back, thoracic/lumbar and R shoulder ROM, light stretching, review HEP, more special testing/screening for R shoulder, once pain improves.    PT Home Exercise Plan  [email or web address]    Consulted and Agree with Plan of Care  Patient       Patient will benefit from skilled therapeutic intervention in order to improve the following deficits and impairments:  Abnormal gait, Decreased endurance, Hypomobility, Decreased activity tolerance, Decreased strength, Impaired UE functional use, Pain, Increased muscle spasms, Decreased mobility, Decreased range of motion, Improper body mechanics,  Impaired flexibility, Postural dysfunction  Visit Diagnosis: Chronic bilateral low  back pain without sciatica  Pain in thoracic spine  Chronic right shoulder pain     Problem List Patient Active Problem List   Diagnosis Date Noted  . Pain in right finger(s) 11/22/2016  . Finger pain, left 11/22/2016  . Chronic right shoulder pain 11/22/2016  . Essential hypertension 05/30/2016  . Right shoulder injury, initial encounter 05/30/2016  . Seasonal allergic rhinitis 05/30/2016  . Muscle spasm of right shoulder 05/30/2016  . Need for immunization against influenza 05/30/2016  . Bipolar disorder (HCC) 05/30/2016  . Acquired mallet finger of right hand 06/15/2015    Sedalia Muta, PT, DPT 9:10 AM  01/01/19    [facility] [address] [place], [place], [address] Phone: [phone]   Fax:  [phone]  Name: SANTIAGA BAYES MRN: [id number] Date of Birth: [birth year]

## 2019-01-08 ENCOUNTER — Other Ambulatory Visit: Payer: Self-pay

## 2019-01-08 ENCOUNTER — Encounter: Payer: Self-pay | Admitting: Physical Therapy

## 2019-01-08 ENCOUNTER — Ambulatory Visit: Payer: Medicaid Other | Attending: Orthopaedic Surgery | Admitting: Physical Therapy

## 2019-01-08 DIAGNOSIS — M546 Pain in thoracic spine: Secondary | ICD-10-CM | POA: Diagnosis present

## 2019-01-08 DIAGNOSIS — M545 Low back pain, unspecified: Secondary | ICD-10-CM

## 2019-01-08 DIAGNOSIS — M25511 Pain in right shoulder: Secondary | ICD-10-CM | POA: Diagnosis present

## 2019-01-08 DIAGNOSIS — G8929 Other chronic pain: Secondary | ICD-10-CM | POA: Insufficient documentation

## 2019-01-08 NOTE — Therapy (Signed)
[facility] [address] [place], [place], [address] Phone: [phone]   Fax:  [phone]  Physical Therapy Treatment  Patient Details  Name: Barbara Alvarez MRN: [id number] Date of Birth: [birth year] Referring Provider (PT): Dr. Magnus IvanBlackman   Encounter Date: 01/08/2019  PT End of Session - 01/08/19 0906    Visit Number  3    Number of Visits  4    Date for PT Re-Evaluation  02/05/19    Authorization Type  Medicaid    PT Start Time  0900    PT Stop Time  0941    PT Time Calculation (min)  41 min    Activity Tolerance  Patient limited by pain    Behavior During Therapy  [facility] for tasks assessed/performed       Past Medical History:  Diagnosis Date  . Bipolar 1 disorder (HCC)   . Bronchitis   . Chronic back pain   . Chronic dental pain   . Chronic neck pain   . Chronic pain in left foot    since great toe fx  . Claustrophobia   . Headache   . Hearing loss in right ear   . Hypertension   . Shoulder pain     Past Surgical History:  Procedure Laterality Date  . CHOLECYSTECTOMY    . LIGAMENT REPAIR     left arm    There were no vitals filed for this visit.  Subjective Assessment - 01/08/19 0919    Subjective  Pt states she hurts all over today, not a good day. She states she has been in pain for 3 days. Has appt with sports med dr tomorrow.    Patient Stated Goals  Decreased pain    Currently in Pain?  Yes    Pain Score  8     Pain Location  Back    Pain Orientation  Right;Left    Pain Descriptors / Indicators  Sore    Pain Type  Chronic pain    Pain Onset  More than a month ago    Pain Frequency  Intermittent    Multiple Pain Sites  Yes                       [facility] Adult PT Treatment/Exercise - 01/08/19 0856      Posture/Postural Control   Posture Comments  --      Exercises   Exercises  Lumbar      Lumbar Exercises: Stretches   Single Knee to Chest Stretch  2 reps;30 seconds;Right;Left     Lower Trunk Rotation  10 seconds;5 reps    Other Lumbar Stretch Exercise  Standing QL stretch 30 sec x3 bil;     Other Lumbar Stretch Exercise  Seated lumbar flexion 30 sec x3;       Lumbar Exercises: Aerobic   Nustep  L1, arms at 14, x 5 min with rest 1/2 way through       Lumbar Exercises: Standing   Row  20 reps    Theraband Level (Row)  Level 2 (Red)    Other Standing Lumbar Exercises  shoulder ladder x3 bilateral;        Lumbar Exercises: Seated   Other Seated Lumbar Exercises  --      Lumbar Exercises: Supine   Pelvic Tilt  15 reps    Other Supine Lumbar Exercises  --      Manual Therapy   Manual Therapy  Soft tissue mobilization;Joint mobilization    Joint Mobilization  attempt for joint mobs for t-spine, too painful with even grade 1/2    Soft tissue mobilization  STM to R med scap border, R thoracic and lumbar paraspinals               PT Short Term Goals - 01/08/19 0957      PT SHORT TERM GOAL #1   Title  Pt to report pain to be 8/10 in R side of Back    Baseline  Pain 10/10    Time  2    Period  Weeks    Status  Achieved    Target Date  01/08/19      PT SHORT TERM GOAL #2   Title  Pt to demo ability to achieve neutral seated posture, without cuing for improved alignment and pain    Baseline  Pt sitting with significnat R trunk lean/side bending, and R shoulder protraction. , due to pain    Time  2    Period  Weeks    Status  Achieved    Target Date  01/08/19        PT Long Term Goals - 12/29/18 1418      PT LONG TERM GOAL #1   Title  Pt to report pain in back to be 0-4/10 with activity, to improve ability for ADLS. and IADLS    Baseline  Pain 10/10    Time  6    Period  Weeks    Status  New    Target Date  02/05/19      PT LONG TERM GOAL #2   Title  Pt to demo improved lumbar ROM to be [facility], to improve ability for IADLS and transfers.    Baseline  Significant limitation with lumbar mobility, and pain    Time  6    Period  Weeks     Status  New    Target Date  02/05/19      PT LONG TERM GOAL #3   Title  Pt to demo improved ROM of R shoulder to be [facility], with pain 0-3/10 to improve ability for reaching, lifing and ADLs.    Baseline  Flexion ROM: 130, with pain up to 8/10    Time  6    Period  Weeks    Status  New    Target Date  02/05/19            Plan - 01/08/19 0958    Clinical Impression Statement  Pt with increased soreness today, and more diffiuclty walking, due to pain. She was able to perform all ther ex, but is hesitant due to pain. She has improving R shoulder AROM, and improved sitting posture. She has tightness with light touch to thoracic region on R, attempt for joint mobs in t-spine, but too painful. Plan to progress as able.    Personal Factors and Comorbidities  Behavior Pattern;Time since onset of injury/illness/exacerbation    Examination-Activity Limitations  Sit;Sleep;Bend;Stand;Carry;Transfers;Lift;Locomotion Level;Reach Overhead    Examination-Participation Restrictions  Cleaning;Shop;Community Activity    Stability/Clinical Decision Making  Evolving/Moderate complexity    Rehab Potential  Fair    PT Frequency  1x / week    PT Duration  6 weeks    PT Treatment/Interventions  ADLs/Self Care Home Management;Cryotherapy;Electrical Stimulation;Iontophoresis 4mg /ml Dexamethasone;Moist Heat;Traction;Therapeutic exercise;Therapeutic activities;Functional mobility training;Stair training;Ultrasound;Neuromuscular re-education;Patient/family education;Manual techniques;Taping;Vasopneumatic Device;Dry needling;Passive range of motion;Spinal Manipulations;Joint Manipulations    PT Next Visit Plan  thoracic/lumbar  and R shoulder ROM, light stretching, progress strengthening as able    PT Home Exercise Plan  [email or web address]    Consulted and Agree with Plan of Care  Patient       Patient will benefit from skilled therapeutic intervention in order to improve the following deficits and impairments:  Abnormal gait,  Decreased endurance, Hypomobility, Decreased activity tolerance, Decreased strength, Impaired UE functional use, Pain, Increased muscle spasms, Decreased mobility, Decreased range of motion, Improper body mechanics, Impaired flexibility, Postural dysfunction  Visit Diagnosis: Chronic bilateral low back pain without sciatica  Pain in thoracic spine  Chronic right shoulder pain     Problem List Patient Active Problem List   Diagnosis Date Noted  . Pain in right finger(s) 11/22/2016  . Finger pain, left 11/22/2016  . Chronic right shoulder pain 11/22/2016  . Essential hypertension 05/30/2016  . Right shoulder injury, initial encounter 05/30/2016  . Seasonal allergic rhinitis 05/30/2016  . Muscle spasm of right shoulder 05/30/2016  . Need for immunization against influenza 05/30/2016  . Bipolar disorder ([place]) 05/30/2016  . Acquired mallet finger of right hand 06/15/2015    Lyndee Hensen, PT, DPT 10:06 AM  01/08/19    [facility] [address] [place], [place], [address] Phone: [phone]   Fax:  [phone]  Name: Barbara Alvarez MRN: [id number] Date of Birth: May 03, 1966

## 2019-01-09 ENCOUNTER — Ambulatory Visit: Payer: Medicaid Other | Admitting: Sports Medicine

## 2019-01-09 VITALS — BP 126/80 | Ht 67.0 in | Wt 157.0 lb

## 2019-01-09 DIAGNOSIS — G8929 Other chronic pain: Secondary | ICD-10-CM | POA: Diagnosis not present

## 2019-01-09 DIAGNOSIS — M549 Dorsalgia, unspecified: Secondary | ICD-10-CM | POA: Diagnosis not present

## 2019-01-09 DIAGNOSIS — M5412 Radiculopathy, cervical region: Secondary | ICD-10-CM

## 2019-01-09 MED ORDER — GABAPENTIN 100 MG PO CAPS: 100.0000 mg | ORAL_CAPSULE | Freq: Every day | ORAL | 0 refills | Status: DC
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
## 2019-01-09 NOTE — Patient Instructions (Addendum)
It was a pleasure seeing you today!  For your back pain, follow up with Dr. Ninfa Linden after you have been doing physical therapy for 6 weeks to see if your MRI will be approved.  We will get x-rays of your neck.  Gabapentin: take 1 pill at night for 3 days, then take 2 pills for 3 days, then take 3 pills every night after that. This should hopefully help with some pain (treats nerve pain).  We will see you back in 3 weeks.

## 2019-01-15 ENCOUNTER — Other Ambulatory Visit: Payer: Self-pay

## 2019-01-15 ENCOUNTER — Encounter: Payer: Self-pay | Admitting: Physical Therapy

## 2019-01-15 ENCOUNTER — Ambulatory Visit: Payer: Medicaid Other | Admitting: Physical Therapy

## 2019-01-15 ENCOUNTER — Ambulatory Visit
Admission: RE | Admit: 2019-01-15 | Discharge: 2019-01-15 | Disposition: A | Discharge status: Home / Self Care | Payer: Medicaid Other | Source: Ambulatory Visit | Attending: Sports Medicine | Admitting: Sports Medicine

## 2019-01-15 DIAGNOSIS — M545 Low back pain: Secondary | ICD-10-CM | POA: Diagnosis not present

## 2019-01-15 DIAGNOSIS — G8929 Other chronic pain: Secondary | ICD-10-CM

## 2019-01-15 DIAGNOSIS — M546 Pain in thoracic spine: Secondary | ICD-10-CM

## 2019-01-15 DIAGNOSIS — M5412 Radiculopathy, cervical region: Secondary | ICD-10-CM

## 2019-01-15 NOTE — Therapy (Addendum)
[facility] [place], [place], [address] Phone: [phone]   Fax:  [phone]  Physical Therapy Treatment/Re-Cert/Discharge    Patient Details  Name: Barbara Alvarez MRN: [id number] Date of Birth: 01-Aug-1965 Referring Provider (PT): Dr. Ninfa Linden   Encounter Date: 01/15/2019  PT End of Session - 01/15/19 1727    Visit Number  4    Number of Visits  10    Date for PT Re-Evaluation  02/26/19    Authorization Type  Medicaid    PT Start Time  0830    PT Stop Time  0914    PT Time Calculation (min)  44 min    Activity Tolerance  Patient limited by pain    Behavior During Therapy  [facility] for tasks assessed/performed       Past Medical History:  Diagnosis Date  . Bipolar 1 disorder ([place])   . Bronchitis   . Chronic back pain   . Chronic dental pain   . Chronic neck pain   . Chronic pain in left foot    since great toe fx  . Claustrophobia   . Headache   . Hearing loss in right ear   . Hypertension   . Shoulder pain     Past Surgical History:  Procedure Laterality Date  . CHOLECYSTECTOMY    . LIGAMENT REPAIR     left arm    There were no vitals filed for this visit.  Subjective Assessment - 01/15/19 0842    Subjective  Pt had MD visit last week, going to get Neck X-ray today. Pt states much soreness in R shoulder, back, and neck today, but states that she thinks PT is helping. Pain variable from day to day.    Limitations  Sitting;Lifting;Standing;House hold activities    Patient Stated Goals  Decreased pain    Currently in Pain?  Yes    Pain Score  7     Pain Location  Back    Pain Orientation  Right    Pain Descriptors / Indicators  Sore    Pain Type  Chronic pain    Pain Onset  More than a month ago    Pain Frequency  Intermittent    Aggravating Factors   bending, sitting, walking , most movement, transfers.    Pain Relieving Factors  sleeping         [facility] PT Assessment - 01/15/19 0001       AROM   Overall AROM Comments  lumbar: mod deficits/pain, improved flexion    Right Shoulder Flexion  150 Degrees    Right Shoulder ABduction  130 Degrees      Strength   Overall Strength Comments  taken at mid range    Right Shoulder Flexion  4-/5    Right Shoulder ABduction  4-/5                   [facility] Adult PT Treatment/Exercise - 01/15/19 0001      Exercises   Exercises  Lumbar      Lumbar Exercises: Stretches   Single Knee to Chest Stretch  2 reps;30 seconds;Right;Left    Lower Trunk Rotation  --    Other Lumbar Stretch Exercise  Standing QL stretch 30 sec x3 bil;     Other Lumbar Stretch Exercise  Seated lumbar flexion 30 sec x3;       Lumbar Exercises: Aerobic   Nustep  L3, x 8 min  Lumbar Exercises: Standing   Row  20 reps    Theraband Level (Row)  Level 3 (Green)    Other Standing Lumbar Exercises  standing shoulder scaption x10 bil;       Lumbar Exercises: Supine   Pelvic Tilt  15 reps    Straight Leg Raise  10 reps    Straight Leg Raises Limitations  2x5 bil;    Other Supine Lumbar Exercises  Bil Shoulder AROM x10;       Manual Therapy   Manual Therapy  Soft tissue mobilization;Joint mobilization;Passive ROM    Joint Mobilization  Post and inf GHJ mobs gr 3, GHJ distraction,     Soft tissue mobilization  --    Passive ROM  PROM for R shoulder for flexion;              PT Education - 01/15/19 1727    Education Details  HEP updated, reviewed today    Person(s) Educated  Patient    Methods  Explanation;Demonstration;Handout    Comprehension  Verbalized understanding;Returned demonstration;Need further instruction       PT Short Term Goals - 01/15/19 1728      PT SHORT TERM GOAL #1   Title  Pt to report pain to be 8/10 in R side of Back    Baseline  Today: pain 7/10    Time  2    Period  Weeks    Status  Achieved    Target Date  01/08/19      PT SHORT TERM GOAL #2   Title  Pt to demo ability to achieve neutral seated  posture, without cuing for improved alignment and pain    Baseline  today: seated posture WL    Time  2    Period  Weeks    Status  Achieved    Target Date  01/08/19        PT Long Term Goals - 01/15/19 1729      PT LONG TERM GOAL #1   Title  Pt to report pain in back to be 0-4/10 with activity, to improve ability for ADLS. and IADLS    Baseline  today: 7/10    Time  6    Period  Weeks    Status  Partially Met    Target Date  02/26/19      PT LONG TERM GOAL #2   Title  Pt to demo improved lumbar ROM to be [facility], to improve ability for IADLS and transfers.    Baseline  Moderate limitation with lumbar mobility, and pain, improved for flexion since Eval    Time  6    Period  Weeks    Status  New    Target Date  02/26/19      PT LONG TERM GOAL #3   Title  Pt to demo improved ROM of R shoulder to be [facility], with pain 0-3/10 to improve ability for reaching, lifing and ADLs.    Baseline  Flexion ROM: 150, abd to 130,  with pain up to 7/10    Time  6    Period  Weeks    Status  New    Target Date  02/26/19            Plan - 01/15/19 1732    Clinical Impression Statement  Pt has been seen for 4 visits, no missed visits. She does continue to have quite a bit of pain today, but states pain is variable. She does  show improved ROM for lumbar flexion, as well as R shoulder elevation today. She also has some strength improvements in R UE, and improved seated posture since Eval. Pt with pain that is limiting further activity. She continues to have pain and tightness in R side of back and into R shoulder. Pt will require continuation of skilled PT to meet LTGs and to return to PLOF. STGs met at this time. Pt doing well with current HEP, reviewed today, will benefit from continued education on strengthening once pain is improved.    Personal Factors and Comorbidities  Behavior Pattern;Time since onset of injury/illness/exacerbation    Examination-Activity Limitations   Sit;Sleep;Bend;Stand;Carry;Transfers;Lift;Locomotion Level;Reach Overhead    Examination-Participation Restrictions  Cleaning;Shop;Community Activity    Stability/Clinical Decision Making  Evolving/Moderate complexity    Rehab Potential  Fair    PT Frequency  1x / week    PT Duration  6 weeks    PT Treatment/Interventions  ADLs/Self Care Home Management;Cryotherapy;Electrical Stimulation;Iontophoresis 36m/ml Dexamethasone;Moist Heat;Traction;Therapeutic exercise;Therapeutic activities;Functional mobility training;Stair training;Ultrasound;Neuromuscular re-education;Patient/family education;Manual techniques;Taping;Vasopneumatic Device;Dry needling;Passive range of motion;Spinal Manipulations;Joint Manipulations    PT Next Visit Plan  thoracic/lumbar and R shoulder ROM, light stretching, progress strengthening as able    PT Home Exercise Plan  [email or web address]    Consulted and Agree with Plan of Care  Patient       Patient will benefit from skilled therapeutic intervention in order to improve the following deficits and impairments:  Abnormal gait, Decreased endurance, Hypomobility, Decreased activity tolerance, Decreased strength, Impaired UE functional use, Pain, Increased muscle spasms, Decreased mobility, Decreased range of motion, Improper body mechanics, Impaired flexibility, Postural dysfunction  Visit Diagnosis: Chronic bilateral low back pain without sciatica  Pain in thoracic spine  Chronic right shoulder pain  PHYSICAL THERAPY DISCHARGE SUMMARY  Visits from Start of Care: 2 Current functional level related to goals / functional outcomes: Unknown patient did not return for follow up   Remaining deficits: Unknown   Education / Equipment: Unknown   Plan: Patient agrees to discharge.  Patient goals were not met. Patient is being discharged due to not returning since the last visit.  ?????       Problem List Patient Active Problem List   Diagnosis Date Noted  . Pain in right  finger(s) 11/22/2016  . Finger pain, left 11/22/2016  . Chronic right shoulder pain 11/22/2016  . Essential hypertension 05/30/2016  . Right shoulder injury, initial encounter 05/30/2016  . Seasonal allergic rhinitis 05/30/2016  . Muscle spasm of right shoulder 05/30/2016  . Need for immunization against influenza 05/30/2016  . Bipolar disorder ([place] 05/30/2016  . Acquired mallet finger of right hand 06/15/2015    LLyndee Hensen PT, DPT 5:36 PM  01/15/19   DCarolyne LittlesPT DPT  12/08/2019 Addendum for discharge     [facility]197 Hartford AvenueGLake [place] [address]Phone: [phone]  Fax:  [phone] Name: Barbara GANDOLFIMRN: [id number] of Birth: [birth year]

## 2019-01-15 NOTE — Patient Instructions (Signed)
Access Code: [email or web address]  URL: [email or web address]  Date: 01/15/2019  Prepared by: Lyndee Hensen   Exercises Single Knee to Chest Stretch - 3 reps - 30 hold - 2x daily Supine Lower Trunk Rotation - 10 reps - 5 hold - 2x daily Supine Shoulder Flexion AAROM with Hands Clasped - 10 reps - 1 sets - 5 hold - 2x daily Straight Leg Raise - 10 reps - 2 sets - 1x daily Scapular Retraction with Resistance - 10 reps - 2 sets - 1x daily Standing Shoulder Scaption - 10 reps - 1 sets - 1x daily Seated Lumbar Flexion Stretch - 30 hold - 2x daily

## 2019-01-21 ENCOUNTER — Other Ambulatory Visit: Payer: Self-pay

## 2019-01-21 ENCOUNTER — Ambulatory Visit: Payer: Medicaid Other | Admitting: Sports Medicine

## 2019-01-21 DIAGNOSIS — M542 Cervicalgia: Secondary | ICD-10-CM

## 2019-01-21 MED ORDER — METHYLPREDNISOLONE ACETATE 80 MG/ML IJ SUSP
80.0000 mg | Freq: Once | INTRAMUSCULAR | Status: AC
  Administered 2019-01-21: 12:00:00 80 mg via INTRAMUSCULAR
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
## 2019-01-22 ENCOUNTER — Telehealth: Payer: Self-pay | Admitting: *Deleted

## 2019-01-22 ENCOUNTER — Telehealth: Payer: Self-pay | Admitting: Orthopaedic Surgery

## 2019-01-22 DIAGNOSIS — M542 Cervicalgia: Secondary | ICD-10-CM

## 2019-01-22 NOTE — Telephone Encounter (Signed)
Order changed to CT cervical spine

## 2019-01-22 NOTE — Telephone Encounter (Signed)
Pt called in wondering if she is clear to get an mri now?   [phone]

## 2019-01-22 NOTE — Telephone Encounter (Signed)
Can we re-submit the MRI and see if it will get approved now?

## 2019-01-22 NOTE — Addendum Note (Signed)
Addended by: Cyd Silence on: 01/22/2019 03:01 PM   Modules accepted: Orders

## 2019-01-23 ENCOUNTER — Telehealth: Payer: Self-pay | Admitting: Orthopaedic Surgery

## 2019-01-23 ENCOUNTER — Other Ambulatory Visit: Payer: Self-pay

## 2019-01-23 DIAGNOSIS — M542 Cervicalgia: Secondary | ICD-10-CM

## 2019-01-23 NOTE — Telephone Encounter (Signed)
Patient called advised she can not get an MRI due to the metal that is in her body. Patient said she called [facility] and they found in their system that she is not suppose to have an MRI in 2018. The number to contact patient is [phone]

## 2019-01-23 NOTE — Telephone Encounter (Signed)
I called patient to let her know that we have put an order in for MRI, that it will take a couple days before she hears anything from a scheduler most likely. She said that we should have a letter from [facility] stating she can't have MRI's because she has metal in her body. I asked where she had metal in her body, because she has had several MRI's in the past.She said she didn't know, but Medicaid told her she did.?

## 2019-01-23 NOTE — Telephone Encounter (Signed)
Patient called advised she can not get an MRI due to the metal that is in her body. Patient said she called [facility] and they found in their system that she is not suppose to have an MRI in 2018. The number to contact patient is [phone] ----- message taken on 9/17.

## 2019-01-23 NOTE — Telephone Encounter (Signed)
Sent order.

## 2019-01-23 NOTE — Telephone Encounter (Signed)
Patient called. Says she missed a call from someone here. Patient called advised she can not get an MRI due to the metal that is in her body.  [phone]

## 2019-01-23 NOTE — Telephone Encounter (Signed)
She would then need a CT myelogram of the lumbar spine to rule out nerve compression.

## 2019-01-24 ENCOUNTER — Telehealth: Payer: Self-pay | Admitting: Nurse Practitioner

## 2019-01-24 ENCOUNTER — Telehealth: Payer: Self-pay

## 2019-01-24 ENCOUNTER — Other Ambulatory Visit: Payer: Self-pay | Admitting: Orthopaedic Surgery

## 2019-01-24 ENCOUNTER — Telehealth: Payer: Self-pay | Admitting: Orthopaedic Surgery

## 2019-01-24 DIAGNOSIS — M542 Cervicalgia: Secondary | ICD-10-CM

## 2019-01-24 NOTE — Telephone Encounter (Signed)
[place] from [facility] called. Says patient refused to schedule.

## 2019-01-24 NOTE — Telephone Encounter (Signed)
FYI

## 2019-01-24 NOTE — Telephone Encounter (Signed)
PC to pt to screen for myelogram procedure. Pt reports she does not want to have the myelogram done at this time and will only be having the CT done, ordered by Dr. Micheline Chapman.
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
## 2019-01-27 ENCOUNTER — Telehealth: Payer: Self-pay | Admitting: Nurse Practitioner

## 2019-01-27 NOTE — Telephone Encounter (Signed)
Phone call to patient to verify medication list and allergies for myelogram procedure. Pt aware she will not need to hold any medications. She states she is no longer taking risperdal d/t a lack of a prescription. Informed her to call back if she restarted that medication prior to her myelogram appointment. Pt verbalized understanding. Pre and post procedure instructions reviewed with pt.

## 2019-01-28 ENCOUNTER — Telehealth: Payer: Self-pay | Admitting: *Deleted

## 2019-01-28 NOTE — Telephone Encounter (Signed)
Received call from Bradley Ferris with [facility] stating she is seeing the CT order for Cervical Myelogram but then sees a note in phone call note stating lumbar spine. Is a Cervical CT myelogram needed or a CT lumbar? Please clarify.

## 2019-01-28 NOTE — Telephone Encounter (Signed)
CT myelogram of the lumbar spine to rule out nerve compression.

## 2019-01-30 ENCOUNTER — Other Ambulatory Visit: Payer: Self-pay | Admitting: Orthopaedic Surgery

## 2019-01-30 ENCOUNTER — Ambulatory Visit: Payer: Medicaid Other | Admitting: Sports Medicine

## 2019-01-30 DIAGNOSIS — M545 Low back pain, unspecified: Secondary | ICD-10-CM

## 2019-01-30 DIAGNOSIS — G8929 Other chronic pain: Secondary | ICD-10-CM

## 2019-02-03 NOTE — Telephone Encounter (Signed)
Order changed.

## 2019-02-04 ENCOUNTER — Inpatient Hospital Stay
Admission: RE | Admit: 2019-02-04 | Discharge: 2019-02-04 | Disposition: A | Discharge status: Home / Self Care | Payer: Medicaid Other | Source: Ambulatory Visit | Attending: Orthopaedic Surgery | Admitting: Orthopaedic Surgery

## 2019-02-04 ENCOUNTER — Inpatient Hospital Stay: Admission: RE | Admit: 2019-02-04 | Payer: Medicaid Other | Source: Ambulatory Visit

## 2019-02-04 NOTE — Discharge Instructions (Signed)

## 2019-02-05 ENCOUNTER — Other Ambulatory Visit: Payer: Self-pay | Admitting: Sports Medicine

## 2019-02-24 ENCOUNTER — Other Ambulatory Visit: Payer: Self-pay | Admitting: Orthopaedic Surgery

## 2019-02-24 DIAGNOSIS — G8929 Other chronic pain: Secondary | ICD-10-CM

## 2019-03-03 ENCOUNTER — Inpatient Hospital Stay (HOSPITAL_COMMUNITY)
Admission: EM | Admit: 2019-03-03 | Discharge: 2019-03-06 | DRG: 552 | Disposition: A | Discharge status: Home / Self Care | Payer: Medicaid Other | Attending: Internal Medicine | Admitting: Internal Medicine

## 2019-03-03 ENCOUNTER — Other Ambulatory Visit: Payer: Self-pay

## 2019-03-03 ENCOUNTER — Ambulatory Visit
Admission: RE | Admit: 2019-03-03 | Discharge: 2019-03-03 | Disposition: A | Discharge status: Home / Self Care | Payer: Medicaid Other | Source: Ambulatory Visit | Attending: Orthopaedic Surgery | Admitting: Orthopaedic Surgery

## 2019-03-03 ENCOUNTER — Encounter (HOSPITAL_COMMUNITY): Payer: Self-pay | Admitting: Emergency Medicine

## 2019-03-03 DIAGNOSIS — M5127 Other intervertebral disc displacement, lumbosacral region: Secondary | ICD-10-CM | POA: Diagnosis present

## 2019-03-03 DIAGNOSIS — M545 Low back pain: Secondary | ICD-10-CM

## 2019-03-03 DIAGNOSIS — M5137 Other intervertebral disc degeneration, lumbosacral region: Secondary | ICD-10-CM | POA: Diagnosis present

## 2019-03-03 DIAGNOSIS — Z809 Family history of malignant neoplasm, unspecified: Secondary | ICD-10-CM

## 2019-03-03 DIAGNOSIS — F40240 Claustrophobia: Secondary | ICD-10-CM | POA: Diagnosis present

## 2019-03-03 DIAGNOSIS — E785 Hyperlipidemia, unspecified: Secondary | ICD-10-CM | POA: Diagnosis present

## 2019-03-03 DIAGNOSIS — M79662 Pain in left lower leg: Secondary | ICD-10-CM | POA: Diagnosis present

## 2019-03-03 DIAGNOSIS — R292 Abnormal reflex: Secondary | ICD-10-CM

## 2019-03-03 DIAGNOSIS — G8929 Other chronic pain: Secondary | ICD-10-CM | POA: Diagnosis present

## 2019-03-03 DIAGNOSIS — Z79899 Other long term (current) drug therapy: Secondary | ICD-10-CM

## 2019-03-03 DIAGNOSIS — M79661 Pain in right lower leg: Secondary | ICD-10-CM | POA: Diagnosis present

## 2019-03-03 DIAGNOSIS — Z8249 Family history of ischemic heart disease and other diseases of the circulatory system: Secondary | ICD-10-CM

## 2019-03-03 DIAGNOSIS — R6889 Other general symptoms and signs: Secondary | ICD-10-CM

## 2019-03-03 DIAGNOSIS — R531 Weakness: Secondary | ICD-10-CM | POA: Diagnosis present

## 2019-03-03 DIAGNOSIS — F319 Bipolar disorder, unspecified: Secondary | ICD-10-CM | POA: Diagnosis present

## 2019-03-03 DIAGNOSIS — R202 Paresthesia of skin: Secondary | ICD-10-CM

## 2019-03-03 DIAGNOSIS — Z9049 Acquired absence of other specified parts of digestive tract: Secondary | ICD-10-CM

## 2019-03-03 DIAGNOSIS — R200 Anesthesia of skin: Secondary | ICD-10-CM

## 2019-03-03 DIAGNOSIS — F17210 Nicotine dependence, cigarettes, uncomplicated: Secondary | ICD-10-CM | POA: Diagnosis present

## 2019-03-03 DIAGNOSIS — R269 Unspecified abnormalities of gait and mobility: Secondary | ICD-10-CM | POA: Diagnosis present

## 2019-03-03 DIAGNOSIS — M5126 Other intervertebral disc displacement, lumbar region: Principal | ICD-10-CM | POA: Diagnosis present

## 2019-03-03 DIAGNOSIS — Z20828 Contact with and (suspected) exposure to other viral communicable diseases: Secondary | ICD-10-CM | POA: Diagnosis present

## 2019-03-03 DIAGNOSIS — I10 Essential (primary) hypertension: Secondary | ICD-10-CM | POA: Diagnosis present

## 2019-03-03 DIAGNOSIS — H9191 Unspecified hearing loss, right ear: Secondary | ICD-10-CM | POA: Diagnosis present

## 2019-03-03 DIAGNOSIS — M542 Cervicalgia: Secondary | ICD-10-CM | POA: Diagnosis present

## 2019-03-03 LAB — CBC WITH DIFFERENTIAL/PLATELET
Abs Immature Granulocytes: 0.02 10*3/uL (ref 0.00–0.07)
Basophils Absolute: 0 10*3/uL (ref 0.0–0.1)
Basophils Relative: 1 %
Eosinophils Absolute: 0.3 10*3/uL (ref 0.0–0.5)
Eosinophils Relative: 5 %
HCT: 39.5 % (ref 36.0–46.0)
Hemoglobin: 12.8 g/dL (ref 12.0–15.0)
Immature Granulocytes: 0 %
Lymphocytes Relative: 45 %
Lymphs Abs: 2.9 10*3/uL (ref 0.7–4.0)
MCH: 28.8 pg (ref 26.0–34.0)
MCHC: 32.4 g/dL (ref 30.0–36.0)
MCV: 89 fL (ref 80.0–100.0)
Monocytes Absolute: 0.5 10*3/uL (ref 0.1–1.0)
Monocytes Relative: 9 %
Neutro Abs: 2.5 10*3/uL (ref 1.7–7.7)
Neutrophils Relative %: 40 %
Platelets: 400 10*3/uL (ref 150–400)
RBC: 4.44 MIL/uL (ref 3.87–5.11)
RDW: 13.6 % (ref 11.5–15.5)
WBC: 6.3 10*3/uL (ref 4.0–10.5)
nRBC: 0 % (ref 0.0–0.2)

## 2019-03-03 LAB — BASIC METABOLIC PANEL
Anion gap: 9 (ref 5–15)
BUN: 6 mg/dL (ref 6–20)
CO2: 24 mmol/L (ref 22–32)
Calcium: 8.9 mg/dL (ref 8.9–10.3)
Chloride: 109 mmol/L (ref 98–111)
Creatinine, Ser: 0.77 mg/dL (ref 0.44–1.00)
GFR calc Af Amer: >60 mL/min (ref >60)
GFR calc non Af Amer: >60 mL/min (ref >60)
Glucose, Bld: 94 mg/dL (ref 70–99)
Potassium: 3.5 mmol/L (ref 3.5–5.1)
Sodium: 142 mmol/L (ref 135–145)

## 2019-03-03 MED ORDER — ONDANSETRON HCL 4 MG/2ML IJ SOLN
4.0000 mg | Freq: Once | INTRAMUSCULAR | Status: AC
  Administered 2019-03-03: 4 mg via INTRAMUSCULAR

## 2019-03-03 MED ORDER — MEPERIDINE HCL 25 MG/ML IJ SOLN
25.0000 mg | Freq: Once | INTRAMUSCULAR | Status: AC
  Administered 2019-03-03: 22:00:00 25 mg via INTRAMUSCULAR
  Filled 2019-03-03: qty 1

## 2019-03-03 MED ORDER — MEPERIDINE HCL 50 MG/ML IJ SOLN
50.0000 mg | Freq: Once | INTRAMUSCULAR | Status: AC
  Administered 2019-03-03: 50 mg via INTRAMUSCULAR

## 2019-03-03 MED ORDER — MEPERIDINE HCL 25 MG/ML IJ SOLN: 25.0000 mg | Freq: Once | INTRAMUSCULAR | Status: DC

## 2019-03-03 MED ORDER — DIAZEPAM 5 MG PO TABS
10.0000 mg | ORAL_TABLET | Freq: Once | ORAL | Status: AC
  Administered 2019-03-03: 10 mg via ORAL

## 2019-03-03 MED ORDER — IOPAMIDOL (ISOVUE-M 200) INJECTION 41%
15.0000 mL | Freq: Once | INTRAMUSCULAR | Status: AC
  Administered 2019-03-03: 15 mL via INTRATHECAL

## 2019-03-03 NOTE — ED Triage Notes (Signed)
Pt arrives via EMS from [place] after myelography. After procedure, pt reports not being able to move or feel bilateral legs. Pt given valium, zofran, and demerol at office.

## 2019-03-03 NOTE — Discharge Instr - Other Orders (Addendum)
1345: Pt c/o pain 10/10 related to procedure. Describing pain as in her back and sharp. PRN meds given per Dr. Jobe Igo. See MAR.   1415: PT back in nurses station. Pt reports some relief from pain medications. Pain level now 5/10. Pt is moaning and concerned because "I can not move my legs". I rubbed pts bilateral lower legs and she stated "I can feel some of it". Pulses present +2 bilaterally. Dr. Jobe Igo was notified and to speak to pt at the bedside. Vitals are stable. Will continue to monitor.   1425: Dr. Jobe Igo at pts bedside doing assessment.   1500: pt still reports she still can not move her legs or feel sensation as she normally does. MD assessed pt. Carrolyn Leigh to stay with pt to await MD orders. Dr. Jobe Igo will reassess pt.

## 2019-03-03 NOTE — ED Notes (Signed)
Patient able to move bilateral lower extremities and stood with 2 person assist. Used bedside commode and returned to stretcher. Provider notified.

## 2019-03-03 NOTE — ED Notes (Signed)
Nurse and nurse tech placed purewick patient verbalized understanding.
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
## 2019-03-03 NOTE — Discharge Instructions (Signed)

## 2019-03-03 NOTE — ED Notes (Signed)
Post void bladder scan 68ML

## 2019-03-03 NOTE — Progress Notes (Signed)
Following routine myelography, the patient complained that she was unable to use her lower extremities.  She was also complaining of significant increase in her pain.  She was given Valium 10mg  PO prior to the procedure.  Due to increased pain after myelogram, she was given demerol 50mg  IM and Zofran 4mg  IM.  3 hours after the procedure she was regaining some movement in her right great toe, but was unable to ambulate.  She has no reflexes at the knee or ankle.  Her toes are down going.  I discussed the case with Dr. Leonel Ramsay at [facility] as well as the [facility] ED triage nurse and have send the patient by [facility] to [facility] ED for further evaluation and pain control as I do not have staffing into the evening to care for this patient.

## 2019-03-04 ENCOUNTER — Other Ambulatory Visit: Payer: Self-pay

## 2019-03-04 ENCOUNTER — Encounter (HOSPITAL_COMMUNITY): Payer: Self-pay | Admitting: *Deleted

## 2019-03-04 ENCOUNTER — Observation Stay (HOSPITAL_COMMUNITY): Payer: Medicaid Other

## 2019-03-04 DIAGNOSIS — M79661 Pain in right lower leg: Secondary | ICD-10-CM

## 2019-03-04 DIAGNOSIS — Z809 Family history of malignant neoplasm, unspecified: Secondary | ICD-10-CM | POA: Diagnosis not present

## 2019-03-04 DIAGNOSIS — Z20828 Contact with and (suspected) exposure to other viral communicable diseases: Secondary | ICD-10-CM | POA: Diagnosis present

## 2019-03-04 DIAGNOSIS — Z9049 Acquired absence of other specified parts of digestive tract: Secondary | ICD-10-CM | POA: Diagnosis not present

## 2019-03-04 DIAGNOSIS — F17210 Nicotine dependence, cigarettes, uncomplicated: Secondary | ICD-10-CM | POA: Diagnosis present

## 2019-03-04 DIAGNOSIS — R269 Unspecified abnormalities of gait and mobility: Secondary | ICD-10-CM | POA: Diagnosis present

## 2019-03-04 DIAGNOSIS — M5137 Other intervertebral disc degeneration, lumbosacral region: Secondary | ICD-10-CM | POA: Diagnosis present

## 2019-03-04 DIAGNOSIS — Z8249 Family history of ischemic heart disease and other diseases of the circulatory system: Secondary | ICD-10-CM | POA: Diagnosis not present

## 2019-03-04 DIAGNOSIS — F319 Bipolar disorder, unspecified: Secondary | ICD-10-CM | POA: Diagnosis present

## 2019-03-04 DIAGNOSIS — E785 Hyperlipidemia, unspecified: Secondary | ICD-10-CM

## 2019-03-04 DIAGNOSIS — Z79899 Other long term (current) drug therapy: Secondary | ICD-10-CM | POA: Diagnosis not present

## 2019-03-04 DIAGNOSIS — I10 Essential (primary) hypertension: Secondary | ICD-10-CM

## 2019-03-04 DIAGNOSIS — R202 Paresthesia of skin: Secondary | ICD-10-CM

## 2019-03-04 DIAGNOSIS — H9191 Unspecified hearing loss, right ear: Secondary | ICD-10-CM | POA: Diagnosis present

## 2019-03-04 DIAGNOSIS — F40240 Claustrophobia: Secondary | ICD-10-CM | POA: Diagnosis present

## 2019-03-04 DIAGNOSIS — M79662 Pain in left lower leg: Secondary | ICD-10-CM

## 2019-03-04 DIAGNOSIS — M542 Cervicalgia: Secondary | ICD-10-CM | POA: Diagnosis present

## 2019-03-04 DIAGNOSIS — R531 Weakness: Secondary | ICD-10-CM | POA: Diagnosis present

## 2019-03-04 DIAGNOSIS — R200 Anesthesia of skin: Secondary | ICD-10-CM | POA: Diagnosis present

## 2019-03-04 DIAGNOSIS — M5127 Other intervertebral disc displacement, lumbosacral region: Secondary | ICD-10-CM | POA: Diagnosis present

## 2019-03-04 DIAGNOSIS — M5126 Other intervertebral disc displacement, lumbar region: Secondary | ICD-10-CM | POA: Diagnosis present

## 2019-03-04 DIAGNOSIS — G8929 Other chronic pain: Secondary | ICD-10-CM | POA: Diagnosis present

## 2019-03-04 LAB — BASIC METABOLIC PANEL
Anion gap: 9 (ref 5–15)
BUN: 5 mg/dL — ABNORMAL LOW (ref 6–20)
CO2: 23 mmol/L (ref 22–32)
Calcium: 8.6 mg/dL — ABNORMAL LOW (ref 8.9–10.3)
Chloride: 108 mmol/L (ref 98–111)
Creatinine, Ser: 0.77 mg/dL (ref 0.44–1.00)
GFR calc Af Amer: >60 mL/min (ref >60)
GFR calc non Af Amer: >60 mL/min (ref >60)
Glucose, Bld: 93 mg/dL (ref 70–99)
Potassium: 3.6 mmol/L (ref 3.5–5.1)
Sodium: 140 mmol/L (ref 135–145)

## 2019-03-04 LAB — CBC
HCT: 39.1 % (ref 36.0–46.0)
Hemoglobin: 12.8 g/dL (ref 12.0–15.0)
MCH: 28.8 pg (ref 26.0–34.0)
MCHC: 32.7 g/dL (ref 30.0–36.0)
MCV: 88.1 fL (ref 80.0–100.0)
Platelets: 378 10*3/uL (ref 150–400)
RBC: 4.44 MIL/uL (ref 3.87–5.11)
RDW: 13.6 % (ref 11.5–15.5)
WBC: 5.6 10*3/uL (ref 4.0–10.5)
nRBC: 0 % (ref 0.0–0.2)

## 2019-03-04 LAB — HIV ANTIBODY (ROUTINE TESTING W REFLEX): HIV Screen 4th Generation wRfx: NONREACTIVE

## 2019-03-04 LAB — SARS CORONAVIRUS 2 (TAT 6-24 HRS): SARS Coronavirus 2: NEGATIVE

## 2019-03-04 MED ORDER — ONDANSETRON HCL 4 MG/2ML IJ SOLN
4.0000 mg | Freq: Four times a day (QID) | INTRAMUSCULAR | Status: DC | PRN
  Administered 2019-03-04 – 2019-03-05 (×2): 4 mg via INTRAVENOUS
  Filled 2019-03-04 (×2): qty 2

## 2019-03-04 MED ORDER — MORPHINE SULFATE (PF) 2 MG/ML IV SOLN
2.0000 mg | INTRAVENOUS | Status: DC | PRN
  Administered 2019-03-04 (×2): 2 mg via INTRAVENOUS
  Filled 2019-03-04 (×2): qty 1

## 2019-03-04 MED ORDER — ACETAMINOPHEN 325 MG PO TABS: 650.0000 mg | ORAL_TABLET | Freq: Four times a day (QID) | ORAL | Status: DC | PRN

## 2019-03-04 MED ORDER — METHYLPREDNISOLONE SODIUM SUCC 40 MG IJ SOLR
40.0000 mg | Freq: Every day | INTRAMUSCULAR | Status: DC
  Administered 2019-03-04 – 2019-03-05 (×2): 40 mg via INTRAVENOUS
  Filled 2019-03-04 (×2): qty 1

## 2019-03-04 MED ORDER — ENOXAPARIN SODIUM 40 MG/0.4ML ~~LOC~~ SOLN
40.0000 mg | Freq: Every day | SUBCUTANEOUS | Status: DC
  Administered 2019-03-05 – 2019-03-06 (×2): 40 mg via SUBCUTANEOUS
  Filled 2019-03-04 (×2): qty 0.4

## 2019-03-04 MED ORDER — TIZANIDINE HCL 4 MG PO TABS
4.0000 mg | ORAL_TABLET | Freq: Once | ORAL | Status: AC
  Administered 2019-03-04: 01:00:00 4 mg via ORAL
  Filled 2019-03-04: qty 1

## 2019-03-04 MED ORDER — LISINOPRIL 20 MG PO TABS
20.0000 mg | ORAL_TABLET | Freq: Every day | ORAL | Status: DC
  Administered 2019-03-04 – 2019-03-06 (×3): 20 mg via ORAL
  Filled 2019-03-04 (×3): qty 1

## 2019-03-04 MED ORDER — MORPHINE SULFATE (PF) 2 MG/ML IV SOLN
2.0000 mg | INTRAVENOUS | Status: DC | PRN
  Administered 2019-03-04 – 2019-03-06 (×7): 2 mg via INTRAVENOUS
  Filled 2019-03-04 (×7): qty 1

## 2019-03-04 MED ORDER — LORAZEPAM 2 MG/ML IJ SOLN
1.0000 mg | Freq: Once | INTRAMUSCULAR | Status: AC
  Administered 2019-03-04: 1 mg via INTRAVENOUS
  Filled 2019-03-04: qty 1

## 2019-03-04 MED ORDER — ATORVASTATIN CALCIUM 40 MG PO TABS
40.0000 mg | ORAL_TABLET | Freq: Every day | ORAL | Status: DC
  Administered 2019-03-04 – 2019-03-06 (×3): 40 mg via ORAL
  Filled 2019-03-04 (×3): qty 1

## 2019-03-04 NOTE — ED Notes (Addendum)
PT/OT at bedside.

## 2019-03-04 NOTE — ED Notes (Signed)
Pt states she is still not ready to have MRI done

## 2019-03-04 NOTE — ED Notes (Signed)
Pt assisted to and from restroom via wheelchair.

## 2019-03-04 NOTE — H&P (Signed)
History and Physical    Barbara Alvarez:[id number] DOB: Nov 14, 1965 DOA: 03/03/2019  PCP: Leilani Able, MD  Patient coming from: Imaging center  I have personally briefly reviewed patient's old medical records in [facility]  Chief Complaint: bilateral LE weakness and pain  HPI: Barbara Alvarez is a 53 y.o. female with medical history significant of Chronic back pain, hypertension who presents concerns of not being able to ambulate and bilateral leg pain following CT myelogram.   Patient has been following orthopedic outpatient for chronic back pain and reportedly was told by Medicare that she cannot get an MRI and so had a CT myelogram ordered. Following routine myelogram today, patient reportedly complained that she was unable to use her lower extremities and also had increased pain.  She was given 10 mg p.o. Valium prior to procedure.  Given increased pain, she was given Demerol 50 mg IM and Zofran 4 mg IM.  Reportedly per radiology note she was able to regain some movement in her right great toe 3 hours after procedure but was unable to ambulate.  She had no reflexes at the knee or ankle.  Her case was discussed with Dr. Amada Jupiter at [facility] and patient was sent over to ED for further evaluation.  Patient reports that her pain is somewhat improved after receiving pain medication.  She is able to ambulate her leg some but reports left worse than right in terms of weakness.  Notes pain to bilateral lumbar paraspinal musculature and sacral region.  Notes pain down her legs with numbness of the feet and toes.  Endorse tobacco use about 4 cigarettes/day.  Denies any alcohol or illicit drug use.   ED Course: She is afebrile and normotensive on room air.  CBC showed no leukocytosis or anemia.  BMP unremarkable.Neurology was consulted by EDP and did serial exam but unable to obtain reflexes- recommends PT/OT. VIR thought possible epidural abscess has low suspicion.    Review of  Systems:  Constitutional: No Weight Change, No Fever ENT/Mouth: No sore throat, No Rhinorrhea Eyes: No Eye Pain, No Vision Changes Cardiovascular: No Chest Pain, no SOB Respiratory: No Cough, No Sputum, No Wheezing, no Dyspnea  Gastrointestinal: No Nausea, No Vomiting, No Diarrhea, No Constipation, No Pain Genitourinary: no Urinary Incontinence, No Urgency, No Flank Pain Musculoskeletal: No Arthralgias, No Myalgias Skin: No Skin Lesions, No Pruritus, Neuro: + Weakness, + Numbness,  No Loss of Consciousness, No Syncope Psych: No Anxiety/Panic, No Depression, no decrease appetite Heme/Lymph: No Bruising, No Bleeding  Past Medical History:  Diagnosis Date   Bipolar 1 disorder (HCC)    Bronchitis    Chronic back pain    Chronic dental pain    Chronic neck pain    Chronic pain in left foot    since great toe fx   Claustrophobia    Headache    Hearing loss in right ear    Hypertension    Shoulder pain     Past Surgical History:  Procedure Laterality Date   CHOLECYSTECTOMY     LIGAMENT REPAIR     left arm     reports that she has been smoking cigarettes. She has been smoking about 0.25 packs per day. She has never used smokeless tobacco. She reports current alcohol use. She reports that she does not use drugs.  Allergies  Allergen Reactions   Haloperidol Decanoate Anaphylaxis   Ibuprofen Shortness Of Breath   Tramadol Swelling   Flexeril [Cyclobenzaprine] Other (See Comments)  Unknown   Naproxen Other (See Comments)    Nasal Drainage    Family History  Problem Relation Age of Onset   Cancer Mother    Hypertension Father    Diabetes Other     Family history reviewed and not pertinent   Prior to Admission medications   Medication Sig Start Date End Date Taking? Authorizing Provider  albuterol (PROVENTIL HFA;VENTOLIN HFA) 108 (90 Base) MCG/ACT inhaler Inhale 2 puffs into the lungs every 6 (six) hours as needed for wheezing or shortness of  breath. 05/30/16  Yes Hedges, Tinnie Gens, PA-C  albuterol (PROVENTIL) (2.5 MG/3ML) 0.083% nebulizer solution Take 3 mLs (2.5 mg total) by nebulization every 6 (six) hours as needed for wheezing or shortness of breath. 05/30/16  Yes Hedges, Tinnie Gens, PA-C  cetirizine (ZYRTEC) 10 MG tablet Take 1 tablet (10 mg total) by mouth daily. 12/17/17  Yes Tilden Fossa, MD  atorvastatin (LIPITOR) 40 MG tablet Take 1 tablet (40 mg total) daily by mouth. 03/14/17   Bing Neighbors, FNP  carbamazepine (EQUETRO) 200 MG CP12 12 hr capsule Take 1 capsule (200 mg total) by mouth at bedtime. 09/07/14   Marlon Pel, PA-C  clonazePAM (KLONOPIN) 1 MG tablet Take 1 mg by mouth 3 (three) times daily as needed for anxiety.    [provider]  gabapentin (NEURONTIN) 100 MG capsule Take 1 capsule (100 mg total) by mouth at bedtime. 01/09/19   Ralene Cork, DO  hydrOXYzine (ATARAX/VISTARIL) 25 MG tablet Take 1-2 tablets (25-50 mg total) by mouth every 6 (six) hours as needed for itching. 09/20/16   Bing Neighbors, FNP  lisinopril (PRINIVIL,ZESTRIL) 20 MG tablet Take 1 tablet (20 mg total) by mouth daily. 11/20/16   Bing Neighbors, FNP  risperiDONE (RISPERDAL) 0.5 MG tablet Take 0.5 mg by mouth 2 (two) times daily.     [provider]  tiZANidine (ZANAFLEX) 4 MG tablet Take 1 tablet (4 mg total) by mouth every 6 (six) hours as needed for muscle spasms. 08/16/17   Bethel Born, PA-C    Physical Exam: Vitals:   03/03/19 1930 03/03/19 2145 03/03/19 2245 03/04/19 0049  BP: 121/68 132/86 132/64 (!) 145/99  Pulse: (!) 55 (!) 55 (!) 55 70  Resp: Temp:      TempSrc:      SpO2: 98% 100% 100% 99%    Constitutional: NAD, calm, comfortable female laying on her left side flat in bed Vitals:   03/03/19 1930 03/03/19 2145 03/03/19 2245 03/04/19 0049  BP: 121/68 132/86 132/64 (!) 145/99  Pulse: (!) 55 (!) 55 (!) 55 70  Resp: Temp:      TempSrc:      SpO2: 98% 100% 100% 99%    Eyes: PERRL, lids and conjunctivae normal ENMT: Mucous membranes are moist.  Neck: normal, supple, no masses Respiratory: clear to auscultation bilaterally, no wheezing, no crackles. Normal respiratory effort. No accessory muscle use.  Cardiovascular: Regular rate and rhythm, no murmurs / rubs / gallops. No extremity edema. 2+ pedal pulses.  Abdomen: no tenderness, no masses palpated.  Bowel sounds positive.  Musculoskeletal: no clubbing / cyanosis. No joint deformity upper and lower extremities. Good ROM, no contractures. Normal muscle tone.  Skin: no rashes, lesions, ulcers. No induration Back: No obvious deformities.  Clear bandage over mid lumbar spine.  Mild pain to palpation of paraspinal lumbar musculature. Neurologic: CN 2-12 grossly intact. Sensation intact, 3+ reflex to right knee  but no reflex elected to left knee or ankles although it was a difficult exam with patient wanting to lay on her left side. 5/5 of strength of the right leg and 3/5 of the left leg.  Psychiatric: Normal judgment and insight. Alert and oriented x 3. Normal mood.     Labs on Admission: I have personally reviewed following labs and imaging studies  CBC: Recent Labs  Lab 03/03/19 1940  WBC 6.3  NEUTROABS 2.5  HGB 12.8  HCT 39.5  MCV 89.0  PLT 400   Basic Metabolic Panel: Recent Labs  Lab 03/03/19 1940  NA 142  K 3.5  CL 109  CO2 24  GLUCOSE 94  BUN 6  CREATININE 0.77  CALCIUM 8.9   GFR: CrCl cannot be calculated (Unknown ideal weight.). Liver Function Tests: No results for input(s): AST, ALT, ALKPHOS, BILITOT, PROT, ALBUMIN in the last 168 hours. No results for input(s): LIPASE, AMYLASE in the last 168 hours. No results for input(s): AMMONIA in the last 168 hours. Coagulation Profile: No results for input(s): INR, PROTIME in the last 168 hours. Cardiac Enzymes: No results for input(s): CKTOTAL, CKMB, CKMBINDEX, TROPONINI in the last 168 hours. BNP (last 3 results) No results for  input(s): PROBNP in the last 8760 hours. HbA1C: No results for input(s): HGBA1C in the last 72 hours. CBG: No results for input(s): GLUCAP in the last 168 hours. Lipid Profile: No results for input(s): CHOL, HDL, LDLCALC, TRIG, CHOLHDL, LDLDIRECT in the last 72 hours. Thyroid Function Tests: No results for input(s): TSH, T4TOTAL, FREET4, T3FREE, THYROIDAB in the last 72 hours. Anemia Panel: No results for input(s): VITAMINB12, FOLATE, FERRITIN, TIBC, IRON, RETICCTPCT in the last 72 hours. Urine analysis:    Component Value Date/Time   COLORURINE YELLOW 12/22/2010 1638   APPEARANCEUR CLEAR 12/22/2010 1638   LABSPEC <=1.005 11/20/2016 0918   PHURINE 5.5 11/20/2016 0918   GLUCOSEU NEGATIVE 11/20/2016 0918   HGBUR NEGATIVE 11/20/2016 0918   BILIRUBINUR NEGATIVE 11/20/2016 0918   KETONESUR NEGATIVE 11/20/2016 0918   PROTEINUR NEGATIVE 11/20/2016 0918   UROBILINOGEN 0.2 11/20/2016 0918   NITRITE NEGATIVE 11/20/2016 0918   LEUKOCYTESUR TRACE (A) 11/20/2016 0918    Radiological Exams on Admission: Ct Lumbar Spine W Contrast  Result Date: 03/03/2019 CLINICAL DATA:  Low back pain extending into the lower extremities bilaterally. EXAM: LUMBAR MYELOGRAM FLUOROSCOPY TIME:  Radiation Exposure Index (as provided by the fluoroscopic device): 237.95 uGy*m2 PROCEDURE: After thorough discussion of risks and benefits of the procedure including bleeding, infection, injury to nerves, blood vessels, adjacent structures as well as headache and CSF leak, written and oral informed consent was obtained. Consent was obtained by Dr. Marin Roberts. Time out form was completed. Patient was positioned prone on the fluoroscopy table. Local anesthesia was provided with 1% lidocaine without epinephrine after prepped and draped in the usual sterile fashion. Puncture was performed at L2-3 using a 3 1/2 inch 22-gauge spinal needle via MIDLINE (____Paramedian) approach. Using a single pass through the dura, the  needle was placed within the thecal sac, with return of clear CSF. 15 mL of Isovue M-200 was injected into the thecal sac, with normal opacification of the nerve roots and cauda equina consistent with free flow within the subarachnoid space. I personally performed the lumbar puncture and administered the intrathecal contrast. I also personally supervised acquisition of the myelogram images. TECHNIQUE: Contiguous axial images were obtained through the Lumbar spine after the intrathecal infusion of infusion. Coronal and sagittal reconstructions were obtained  of the axial image sets. 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 There is moderate stenosis at the L5-S1 level with early truncation of the S1 nerve roots bilaterally. There is incomplete filling of the L5 nerve roots as well. CT LUMBAR MYELOGRAM FINDINGS: The lumbar spine is imaged from the midbody of T10 through the midbody of S3. Mild rightward curvature is centered at L2 and leftward curvature at L5-S1. Atherosclerotic calcifications are present in the aorta without aneurysm. The patient is status post cholecystectomy. No other solid organ lesions are present. There is no significant adenopathy. L1-2: Negative. L2-3: Negative. L3-4: A broad-based disc protrusion extends into the foramina bilaterally. Mild foraminal narrowing, worse on the left, is similar the prior study. L4-5: A broad-based disc protrusion is present. The disc extends into the left neural foramen. The central canal is patent. Mild left foraminal narrowing is present. L5-S1: A broad-based disc protrusion is present. There is significant progression since 2013. Moderate central canal stenosis is present. Disc material extends inferiorly the with asymmetric impact on the left S1 and S2 nerve roots. Moderate foraminal stenosis is worse right than left, with progression. No significant  epidural hematoma or mass effect is present on the conus medullaris or nerve roots. Following the procedure, the patient was unable to walk. She profound weakness in both lower extremities. 3 hours after the procedure she was regaining some strength in her right greater than left great toe. She a persistent back pain extending into the thoracic region. Immediately following the procedure her pain was treated with Demerol 50 mg and Zofran 4 mg IM. She still had severe pain 3 hours later. She was transferred to the emergency department for further evaluation of her weakness and pain management without adequate support at home or the ability to ambulate yet at that point. IMPRESSION: 1. Progressive broad-based disc protrusion and inferior disc extrusion at L5-S1 with moderate central canal stenosis and subarticular narrowing, left greater than right. 2. Moderate foraminal narrowing at L5-S1 is worse on the right. 3. Mild left foraminal narrowing at L4-5 demonstrates some progression. 4. Mild foraminal narrowing bilaterally at L3-4 is stable. 5. Weakness in the lower extremities following the procedure. This is atypical with myelography. No extrinsic mass effect is evident. Patient was regaining some strength but unable to ambulate and was therefore transferred to the emergency department for further evaluation as well as pain management. Ambulance transfer was arranged. The case was discussed with the triage nurse at the [facility] emergency room as well as the neural hospitalist on-call. Electronically Signed   By: San Morelle M.D.   On: 03/03/2019 18:51   Dg Myelography Lumbar Inj Lumbosacral  Result Date: 03/03/2019 CLINICAL DATA:  Low back pain extending into the lower extremities bilaterally. EXAM: LUMBAR MYELOGRAM FLUOROSCOPY TIME:  Radiation Exposure Index (as provided by the fluoroscopic device): 237.95 uGy*m2 PROCEDURE: After thorough discussion of risks and benefits of the procedure including  bleeding, infection, injury to nerves, blood vessels, adjacent structures as well as headache and CSF leak, written and oral informed consent was obtained. Consent was obtained by Dr. San Morelle. Time out form was completed. Patient was positioned prone on the fluoroscopy table. Local anesthesia was provided with 1% lidocaine without epinephrine after prepped and draped in the usual sterile fashion. Puncture was performed at L2-3 using a 3 1/2 inch 22-gauge spinal needle via MIDLINE (____Paramedian) approach. Using a single pass through  the dura, the needle was placed within the thecal sac, with return of clear CSF. 15 mL of Isovue M-200 was injected into the thecal sac, with normal opacification of the nerve roots and cauda equina consistent with free flow within the subarachnoid space. I personally performed the lumbar puncture and administered the intrathecal contrast. I also personally supervised acquisition of the myelogram images. TECHNIQUE: Contiguous axial images were obtained through the Lumbar spine after the intrathecal infusion of infusion. Coronal and sagittal reconstructions were obtained of the axial image sets. 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 There is moderate stenosis at the L5-S1 level with early truncation of the S1 nerve roots bilaterally. There is incomplete filling of the L5 nerve roots as well. CT LUMBAR MYELOGRAM FINDINGS: The lumbar spine is imaged from the midbody of T10 through the midbody of S3. Mild rightward curvature is centered at L2 and leftward curvature at L5-S1. Atherosclerotic calcifications are present in the aorta without aneurysm. The patient is status post cholecystectomy. No other solid organ lesions are present. There is no significant adenopathy. L1-2: Negative. L2-3: Negative. L3-4: A broad-based disc protrusion extends into the foramina  bilaterally. Mild foraminal narrowing, worse on the left, is similar the prior study. L4-5: A broad-based disc protrusion is present. The disc extends into the left neural foramen. The central canal is patent. Mild left foraminal narrowing is present. L5-S1: A broad-based disc protrusion is present. There is significant progression since 2013. Moderate central canal stenosis is present. Disc material extends inferiorly the with asymmetric impact on the left S1 and S2 nerve roots. Moderate foraminal stenosis is worse right than left, with progression. No significant epidural hematoma or mass effect is present on the conus medullaris or nerve roots. Following the procedure, the patient was unable to walk. She profound weakness in both lower extremities. 3 hours after the procedure she was regaining some strength in her right greater than left great toe. She a persistent back pain extending into the thoracic region. Immediately following the procedure her pain was treated with Demerol 50 mg and Zofran 4 mg IM. She still had severe pain 3 hours later. She was transferred to the emergency department for further evaluation of her weakness and pain management without adequate support at home or the ability to ambulate yet at that point. IMPRESSION: 1. Progressive broad-based disc protrusion and inferior disc extrusion at L5-S1 with moderate central canal stenosis and subarticular narrowing, left greater than right. 2. Moderate foraminal narrowing at L5-S1 is worse on the right. 3. Mild left foraminal narrowing at L4-5 demonstrates some progression. 4. Mild foraminal narrowing bilaterally at L3-4 is stable. 5. Weakness in the lower extremities following the procedure. This is atypical with myelography. No extrinsic mass effect is evident. Patient was regaining some strength but unable to ambulate and was therefore transferred to the emergency department for further evaluation as well as pain management. Ambulance transfer  was arranged. The case was discussed with the [facility] as well as the neural hospitalist on-call. Electronically Signed   By: Marin Robertshristopher  Mattern M.D.   On: 03/03/2019 18:51    EKG: Independently reviewed.   Assessment/Plan  Lower extremity weakness, pain and areflexia following CT myelogram -Patient was receiving CT myelogram ordered by orthopedic outpatient today when patient suddenly could not ambulate her legs and complaint of severe lower extremity pain and numbness and tingling. -Symptoms  are improved in the ED but continues complain of pain.  Difficult to obtain reflexes due to patient positioning. -Neurology consulted by EDP and recommends PT/OT - frequent neurovascular checks of the lower extremity -CT myelography of Lumbar outpatient showed progressive broad-based disc protrusion and inferior disc extrusion at L5-S1 and foraminal narrowing of L4-L5 and L5-SI which does not explain her symptoms - Trial IV solu-medrol  - PRN morphine 2mg  for pain  Hyperlipidemia -Continue atorvastatin   Hypertension -Continue lisinopril  DVT prophylaxis: Lovenox Code Status: Full Family Communication: Plan discussed with patient at bedside  disposition Plan: Home with observation Consults called: Neuro Admission status: Observation   Kamiya Acord T Dan Scearce DO [facility]   If 7PM-7AM, please contact night-coverage [email or web address] Password [email or web address]  03/04/2019, 1:13 AM

## 2019-03-04 NOTE — ED Notes (Signed)
Lunch tray at bedside. ?

## 2019-03-04 NOTE — Progress Notes (Signed)
Called ER RN for report. Room ready.  

## 2019-03-04 NOTE — ED Notes (Signed)
Attempted report 

## 2019-03-04 NOTE — ED Notes (Signed)
Pt states she is agreeable to MRI; MRI notified

## 2019-03-04 NOTE — ED Notes (Signed)
Pt refusing MRI

## 2019-03-04 NOTE — ED Notes (Signed)
Patient transported to MRI 

## 2019-03-04 NOTE — Progress Notes (Signed)
New Admission Note:  Arrival Method: Stretcher Mental Orientation: Alert and oriented x 4. Pt very irritable and short to staff.  Telemetry: None Assessment: Completed Skin: Warm and dry.  IV: NSL Pain: Denies  Tubes: N/A Safety Measures: Safety Fall Prevention Plan initiated.  Admission: Completed 5 M  Orientation: Patient has been orientated to the room, unit and the staff. Welcome booklet given.  Family: None  Orders have been reviewed and implemented. Will continue to monitor the patient. Call light has been placed within reach and bed alarm has been activated.   Sima Matas BSN, RN  Phone Number: [phone]

## 2019-03-04 NOTE — Progress Notes (Signed)
Patient placed in observation after midnight, please see H&P.  Care began prior to midnight in ER.  MRIs being done.  Neurology consult appreciated.  PT eval pending-- functional component suspected.

## 2019-03-04 NOTE — Evaluation (Signed)
Occupational Therapy Evaluation Patient Details Name: Barbara Alvarez MRN: [id number] DOB: August 09, 1965 Today's Date: 03/04/2019    History of Present Illness Pt presented to ED wtih acute onset back pain and LE pain and weakness following CT myelogram.  MRI of cervical and thoracic spine were normal.  MRI of lumbar spine with no evidence of complication and no explanation for acute weakness.  L5-S1 focal disc degeneration with subarticular recess and Rt foraminal narrowing.  PMH includes:  shoulder pain, HTN, HA, clausterphobia, chronic neck pain, chronic pain Lt foot, chronic back pain, bipolar disorder    Clinical Impression   Pt admitted with above. She demonstrates the below listed deficits and will benefit from continued OT to maximize safety and independence with BADLs.  Pt currently requires set up - min A for LB ADLs as she has difficulty accessing her feet fully due to pain.  She requires min guard assist for functional mobility.  Pt reports she lives in a hotel with her fiance', who works in the pm, and her daughter who lives across the hallway.  She reports she will have 24 hour assist if needed at discharge.  She also reports "someone will carry me" up the stairs when access obstacles discussed with her.  She would benefit from a tub transfer bench.       Follow Up Recommendations  No OT follow up;Supervision/Assistance - 24 hour    Equipment Recommendations  Tub/shower bench    Recommendations for Other Services       Precautions / Restrictions Precautions Precautions: Fall      Mobility Bed Mobility Overal bed mobility: Needs Assistance Bed Mobility: Supine to Sit     Supine to sit: Min assist     General bed mobility comments: Pt restless and concerned about finding her phone.  She required mod cues to move to EOB and assist to lift trunk.  Heavy reliance on bedrail   Transfers Overall transfer level: Needs assistance Equipment used: Rolling walker (2  wheeled) Transfers: Sit to/from UGI CorporationStand;Stand Pivot Transfers Sit to Stand: Min guard Stand pivot transfers: Min guard       General transfer comment: requires increased time and effort.  Min guard for safety     Balance Overall balance assessment: Needs assistance Sitting-balance support: Feet supported Sitting balance-Leahy Scale: Good     Standing balance support: Single extremity supported Standing balance-Leahy Scale: Poor Standing balance comment: requires UE support                            ADL either performed or assessed with clinical judgement   ADL Overall ADL's : Needs assistance/impaired Eating/Feeding: Independent Eating/Feeding Details (indicate cue type and reason): limited due to reports of nausea  Grooming: Wash/dry hands;Wash/dry face;Oral care;Brushing hair;Set up;Sitting   Upper Body Bathing: Set up;Sitting   Lower Body Bathing: Minimal assistance;Sit to/from stand Lower Body Bathing Details (indicate cue type and reason): assist for feet  Upper Body Dressing : Set up;Sitting   Lower Body Dressing: Minimal assistance;Sit to/from stand Lower Body Dressing Details (indicate cue type and reason): difficulty accessing feet  Toilet Transfer: Min guard;Ambulation;Comfort height toilet;BSC;RW   Toileting- ArchitectClothing Manipulation and Hygiene: Min guard;Sit to/from stand       Functional mobility during ADLs: Min guard;Rolling walker General ADL Comments: Pt moves slowly due to reports of pain      Vision Baseline Vision/History: Wears glasses Wears Glasses: At all times Patient Visual Report: No  change from baseline       Perception     Praxis      Pertinent Vitals/Pain Pain Assessment: 0-10 Pain Score: 9  Pain Location: back and Rt LE  Pain Descriptors / Indicators: Grimacing;Guarding;Sharp;Restless Pain Intervention(s): Monitored during session;Repositioned     Hand Dominance Right   Extremity/Trunk Assessment Upper Extremity  Assessment Upper Extremity Assessment: Overall WFL for tasks assessed   Lower Extremity Assessment Lower Extremity Assessment: Defer to PT evaluation   Cervical / Trunk Assessment Cervical / Trunk Assessment: Normal   Communication Communication Communication: No difficulties   Cognition Arousal/Alertness: Awake/alert Behavior During Therapy: Anxious;Restless Overall Cognitive Status: Within Functional Limits for tasks assessed                                     General Comments  Discussed follow up therapy with pt and concerns re: steps to access her room.  She reports "someone will carry me up", and that she would prefer OP therapies as opposed to [facility] therapies.      Exercises     Shoulder Instructions      Home Living Family/patient expects to be discharged to:: Other (Comment)(hotel )                                 Additional Comments: Pt reports she lives in a hotel on the second floor with tub shower combo.  She reports she lives with fiance' and her daughter lives across the hall       Prior Functioning/Environment Level of Independence: Independent        Comments: Pt reports she ambulated without AD, and was fully independent with ADLs.  She reports she uses transporation to go to appointments, denies h/o falls, and reports she recently underwent 5 weeks of PT         OT Problem List: Decreased activity tolerance;Impaired balance (sitting and/or standing);Decreased safety awareness;Decreased knowledge of use of DME or AE;Pain      OT Treatment/Interventions: Self-care/ADL training;DME and/or AE instruction;Therapeutic activities;Patient/family education;Balance training    OT Goals(Current goals can be found in the care plan section) Acute Rehab OT Goals Patient Stated Goal: to have less pain  OT Goal Formulation: With patient Time For Goal Achievement: 03/11/19 Potential to Achieve Goals: Good ADL Goals Pt Will Perform  Grooming: with modified independence;standing Pt Will Perform Upper Body Bathing: with modified independence;sitting Pt Will Perform Lower Body Bathing: with modified independence;sit to/from stand Pt Will Perform Upper Body Dressing: with modified independence;sitting;standing Pt Will Perform Lower Body Dressing: with modified independence;sit to/from stand Pt Will Transfer to Toilet: with modified independence;ambulating;regular height toilet;bedside commode;grab bars Pt Will Perform Toileting - Clothing Manipulation and hygiene: with modified independence;sit to/from stand Pt Will Perform Tub/Shower Transfer: Tub transfer;with modified independence;tub bench;rolling walker  OT Frequency: Min 2X/week   Barriers to D/C: (Pt reports family will provide assist as needed )          Co-evaluation PT/OT/SLP Co-Evaluation/Treatment: Yes Reason for Co-Treatment: For patient/therapist safety;To address functional/ADL transfers   OT goals addressed during session: ADL's and self-care      AM-PAC OT "6 Clicks" Daily Activity     Outcome Measure Help from another person eating meals?: None Help from another person taking care of personal grooming?: A Little Help from another person toileting, which includes using toliet,  bedpan, or urinal?: A Little Help from another person bathing (including washing, rinsing, drying)?: A Little Help from another person to put on and taking off regular upper body clothing?: A Little Help from another person to put on and taking off regular lower body clothing?: A Little 6 Click Score: 19   End of Session Equipment Utilized During Treatment: Gait belt;Rolling walker Nurse Communication: Mobility status;Patient requests pain meds  Activity Tolerance: Patient limited by pain Patient left: in chair;with call bell/phone within reach  OT Visit Diagnosis: Unsteadiness on feet (R26.81);Pain Pain - Right/Left: Right Pain - part of body: Leg(back)                 Time: 4854-6270 OT Time Calculation (min): 24 min Charges:  OT General Charges $OT Visit: 1 Visit OT Evaluation $OT Eval Moderate Complexity: 1 Mod  Lucille Passy, OTR/L [place] Pager [phone] Office [phone]   Lucille Passy M 03/04/2019, 3:37 PM

## 2019-03-04 NOTE — Progress Notes (Signed)
Pt is able to stand and walk with RW, but is noted to have pain and weakness with fluctuating testing effort on RLE.  Her plan is to work on mobility and strengthening acutely as pt is declining to consider a placement of home therapy at her motel.  Pt may not be able to have this service in motel setting so will anticipate her need for outpatient to work on recovery of strength and balance, endurance for safer gait.  03/04/19 1600  PT Visit Information  Last PT Received On 03/04/19  Assistance Needed +1  PT/OT/SLP Co-Evaluation/Treatment Yes  Reason for Co-Treatment Complexity of the patient's impairments (multi-system involvement);For patient/therapist safety;Necessary to address cognition/behavior during functional activity  PT goals addressed during session Mobility/safety with mobility;Balance;Proper use of DME  History of Present Illness Pt presented to ED wtih acute onset back pain and LE pain and weakness following CT myelogram.  MRI of cervical and thoracic spine were normal.  MRI of lumbar spine with no evidence of complication and no explanation for acute weakness.  L5-S1 focal disc degeneration with subarticular recess and Rt foraminal narrowing.  PMH includes:  shoulder pain, HTN, HA, clausterphobia, chronic neck pain, chronic pain Lt foot, chronic back pain, bipolar disorder   Precautions  Precautions Fall  Precaution Comments has not yet sustained a fall  Restrictions  Weight Bearing Restrictions No  Home Living  Family/patient expects to be discharged to:  (motel)  Additional Comments living in upstairs motel room with no elevator with her boyfriend  Prior Function  Level of Independence Independent  Comments was not on AD previously and I with all self care  Communication  Communication No difficulties  Pain Assessment  Pain Assessment 0-10  Pain Score 9  Pain Location back and Rt LE   Pain Descriptors / Indicators Grimacing;Guarding;Sharp;Restless  Pain Intervention(s)  Limited activity within patient's tolerance;Premedicated before session;Repositioned;Patient requesting pain meds-RN notified  Cognition  Arousal/Alertness Awake/alert  Behavior During Therapy Anxious  Overall Cognitive Status Within Functional Limits for tasks assessed  General Comments pt is talking about her plan to go home  Upper Extremity Assessment  Upper Extremity Assessment Overall WFL for tasks assessed  Lower Extremity Assessment  Lower Extremity Assessment RLE deficits/detail (R leg tests stronger and weaker, fluctuating effort)  Cervical / Trunk Assessment  Cervical / Trunk Assessment Other exceptions (had recent myelogram with stenosis, L5-S1)  Bed Mobility  Overal bed mobility Needs Assistance  Bed Mobility Supine to Sit  Supine to sit Min guard;Min assist  General bed mobility comments min assist for her blankets and lines  Transfers  Overall transfer level Needs assistance  Equipment used Rolling walker (2 wheeled)  Transfers Sit to/from Stand  Sit to Stand Min guard  Stand pivot transfers Min guard  General transfer comment safety contact  Ambulation/Gait  Ambulation/Gait assistance Min guard;+2 physical assistance;+2 safety/equipment  Gait Distance (Feet) 75 Feet  Assistive device Rolling walker (2 wheeled);2 person hand held assist  Gait Pattern/deviations Step-through pattern;Decreased stride length  General Gait Details pt shows no signs of buckling  Gait velocity reduced  Balance  Overall balance assessment Needs assistance  Sitting-balance support Feet supported  Sitting balance-Leahy Scale Good  Standing balance support Bilateral upper extremity supported;During functional activity  Standing balance-Leahy Scale Poor  Standing balance comment with RW is fair balance  General Comments  General comments (skin integrity, edema, etc.) pt was not concerned with geography of home, reports she will get someone to carry her up the stairs to  her motel room.  No  elevator is presetn  Exercises  Exercises Other exercises (RLE strength is 3 to 3+)  PT - End of Session  Equipment Utilized During Treatment Gait belt  Activity Tolerance Patient limited by fatigue  Patient left in chair;with call bell/phone within reach  Nurse Communication Mobility status  PT Assessment  PT Visit Diagnosis Unsteadiness on feet (R26.81);Muscle weakness (generalized) (M62.81);Pain  Pain - Right/Left Right  Pain - part of body Hip;Knee;Leg  PT Plan  PT Frequency (ACUTE ONLY) Min 3X/week  AM-PAC PT "6 Clicks" Mobility Outcome Measure (Version 2)  Help needed turning from your back to your side while in a flat bed without using bedrails? 3  Help needed moving from lying on your back to sitting on the side of a flat bed without using bedrails? 3  Help needed moving to and from a bed to a chair (including a wheelchair)? 3  Help needed standing up from a chair using your arms (e.g., wheelchair or bedside chair)? 3  Help needed to walk in hospital room? 3  Help needed climbing 3-5 steps with a railing?  1  6 Click Score 16  Consider Recommendation of Discharge To: Home with [facility]  PT Recommendation  Follow Up Recommendations Outpatient PT;Supervision for mobility/OOB  PT equipment Rolling walker with 5" wheels  Acute Rehab PT Goals  Patient Stated Goal to have less pain   PT Goal Formulation With patient  Time For Goal Achievement 03/18/19  Potential to Achieve Goals Good  PT Time Calculation  PT Start Time (ACUTE ONLY) 1309  PT Stop Time (ACUTE ONLY) 1333  PT Time Calculation (min) (ACUTE ONLY) 24 min  PT General Charges  $$ ACUTE PT VISIT 1 Visit  PT Evaluation  $PT Eval Moderate Complexity 1 Mod  Written Expression  Dominant Hand Right    Mee Hives, PT MS Acute Rehab Dept. Number: [place] [facility] [place]

## 2019-03-04 NOTE — ED Notes (Signed)
ED TO INPATIENT HANDOFF REPORT  ED Nurse Name and Phone #: [phone]  S Name/Age/Gender Barbara Alvarez 53 y.o. female Room/Bed: 057C/057C  Code Status   Code Status: Full Code  Home/SNF/Other Home Patient oriented to: self, place, time and situation Is this baseline? Yes   Triage Complete: Triage complete  Chief Complaint From Drs office, Can't move legs  Triage Note Pt arrives via EMS from [facility] imaging after myelography. After procedure, pt reports not being able to move or feel bilateral legs. Pt given valium, zofran, and demerol at office.   Allergies Allergies  Allergen Reactions  . Haloperidol Decanoate Anaphylaxis  . Ibuprofen Shortness Of Breath  . Tramadol Swelling  . Flexeril [Cyclobenzaprine] Other (See Comments)    Unknown  . Naproxen Other (See Comments)    Nasal Drainage    Level of Care/Admitting Diagnosis ED Disposition    ED Disposition Condition Comment   Admit  Hospital Area: [facility] [[id number]]  Level of Care: Med-Surg [16]  Covid Evaluation: Asymptomatic Screening Protocol (No Symptoms)  Diagnosis: Lower extremity pain, bilateral [161096]  Admitting Physician: Florene Glen  Attending Physician: Joseph Art [[id number]]  Estimated length of stay: past midnight tomorrow  Certification:: I certify this patient will need inpatient services for at least 2 midnights  PT Class (Do Not Modify): Inpatient [101]  PT Acc Code (Do Not Modify): Private [1]       B Medical/Surgery History Past Medical History:  Diagnosis Date  . Bipolar 1 disorder (HCC)   . Bronchitis   . Chronic back pain   . Chronic dental pain   . Chronic neck pain   . Chronic pain in left foot    since great toe fx  . Claustrophobia   . Headache   . Hearing loss in right ear   . Hypertension   . Shoulder pain    Past Surgical History:  Procedure Laterality Date  . CHOLECYSTECTOMY    . LIGAMENT REPAIR     left arm     A IV  Location/Drains/Wounds Patient Lines/Drains/Airways Status   Active Line/Drains/Airways    Name:   Placement date:   Placement time:   Site:   Days:   Peripheral IV 03/04/19 Left Hand   03/04/19    0219    Hand   less than 1          Intake/Output Last 24 hours  Intake/Output Summary (Last 24 hours) at 03/04/2019 1844 Last data filed at 03/03/2019 1920 Gross per 24 hour  Intake -  Output 1100 ml  Net -1100 ml    Labs/Imaging Results for orders placed or performed during the hospital encounter of 03/03/19 (from the past 48 hour(s))  CBC with Differential     Status: None   Collection Time: 03/03/19  7:40 PM  Result Value Ref Range   WBC 6.3 4.0 - 10.5 K/uL   RBC 4.44 3.87 - 5.11 MIL/uL   Hemoglobin 12.8 12.0 - 15.0 g/dL   HCT 04.5 40.9 - 81.1 %   MCV 89.0 80.0 - 100.0 fL   MCH 28.8 26.0 - 34.0 pg   MCHC 32.4 30.0 - 36.0 g/dL   RDW 91.4 78.2 - 95.6 %   Platelets 400 150 - 400 K/uL   nRBC 0.0 0.0 - 0.2 %   Neutrophils Relative % 40 %   Neutro Abs 2.5 1.7 - 7.7 K/uL   Lymphocytes Relative 45 %   Lymphs Abs 2.9 0.7 -  4.0 K/uL   Monocytes Relative 9 %   Monocytes Absolute 0.5 0.1 - 1.0 K/uL   Eosinophils Relative 5 %   Eosinophils Absolute 0.3 0.0 - 0.5 K/uL   Basophils Relative 1 %   Basophils Absolute 0.0 0.0 - 0.1 K/uL   Immature Granulocytes 0 %   Abs Immature Granulocytes 0.02 0.00 - 0.07 K/uL    Comment: Performed at [facility], [address] [place], [place] [address]  Basic metabolic panel     Status: None   Collection Time: 03/03/19  7:40 PM  Result Value Ref Range   Sodium 142 135 - 145 mmol/L   Potassium 3.5 3.5 - 5.1 mmol/L   Chloride 109 98 - 111 mmol/L   CO2 24 22 - 32 mmol/L   Glucose, Bld 94 70 - 99 mg/dL   BUN 6 6 - 20 mg/dL   Creatinine, Ser 1.91 0.44 - 1.00 mg/dL   Calcium 8.9 8.9 - 47.8 mg/dL   GFR calc non Af Amer >60 >60 mL/min   GFR calc Af Amer >60 >60 mL/min   Anion gap 9 5 - 15    Comment: Performed at [facility]  Lab, [address], [place], [place] [address]  SARS CORONAVIRUS 2 (TAT 6-24 HRS) Nasopharyngeal Nasopharyngeal Swab     Status: None   Collection Time: 03/04/19 12:30 AM   Specimen: Nasopharyngeal Swab  Result Value Ref Range   SARS Coronavirus 2 NEGATIVE NEGATIVE    Comment: (NOTE) SARS-CoV-2 target nucleic acids are NOT DETECTED. The SARS-CoV-2 RNA is generally detectable in upper and lower respiratory specimens during the acute phase of infection. Negative results do not preclude SARS-CoV-2 infection, do not rule out co-infections with other pathogens, and should not be used as the sole basis for treatment or other patient management decisions. Negative results must be combined with clinical observations, patient history, and epidemiological information. The expected result is Negative. Fact Sheet for Patients: HairSlick.no Fact Sheet for Healthcare Providers: quierodirigir.com This test is not yet approved or cleared by the [place] FDA and  has been authorized for detection and/or diagnosis of SARS-CoV-2 by FDA under an Emergency Use Authorization (EUA). This EUA will remain  in effect (meaning this test can be used) for the duration of the COVID-19 declaration under Section 56 4(b)(1) of the Act, 21 U.S.C. section 360bbb-3(b)(1), unless the authorization is terminated or revoked sooner. Performed at [facility], [address], [place], [place] [address]   HIV Antibody (routine testing w rflx)     Status: None   Collection Time: 03/04/19  2:20 AM  Result Value Ref Range   HIV Screen 4th Generation wRfx NON REACTIVE NON REACTIVE    Comment: Performed at [facility], [address], [place], [place] [address]  CBC     Status: None   Collection Time: 03/04/19  2:20 AM  Result Value Ref Range   WBC 5.6 4.0 - 10.5 K/uL   RBC 4.44 3.87 - 5.11 MIL/uL   Hemoglobin 12.8 12.0 - 15.0 g/dL   HCT 96.2 95.2 - 84.1  %   MCV 88.1 80.0 - 100.0 fL   MCH 28.8 26.0 - 34.0 pg   MCHC 32.7 30.0 - 36.0 g/dL   RDW 32.4 40.1 - 02.7 %   Platelets 378 150 - 400 K/uL   nRBC 0.0 0.0 - 0.2 %    Comment: Performed at [facility], [address] [place],  [place] [address]  Basic metabolic panel     Status: Abnormal   Collection Time: 03/04/19  2:20 AM  Result Value Ref Range   Sodium 140 135 - 145 mmol/L   Potassium 3.6 3.5 - 5.1 mmol/L   Chloride 108 98 - 111 mmol/L   CO2 23 22 - 32 mmol/L   Glucose, Bld 93 70 - 99 mg/dL   BUN 5 (L) 6 - 20 mg/dL   Creatinine, Ser 3.08 0.44 - 1.00 mg/dL   Calcium 8.6 (L) 8.9 - 10.3 mg/dL   GFR calc non Af Amer >60 >60 mL/min   GFR calc Af Amer >60 >60 mL/min   Anion gap 9 5 - 15    Comment: Performed at [facility], [address] [place], [place] [address]   Ct Lumbar Spine W Contrast  Result Date: 03/03/2019 CLINICAL DATA:  Low back pain extending into the lower extremities bilaterally. EXAM: LUMBAR MYELOGRAM FLUOROSCOPY TIME:  Radiation Exposure Index (as provided by the fluoroscopic device): 237.95 uGy*m2 PROCEDURE: After thorough discussion of risks and benefits of the procedure including bleeding, infection, injury to nerves, blood vessels, adjacent structures as well as headache and CSF leak, written and oral informed consent was obtained. Consent was obtained by Dr. Marin Roberts. Time out form was completed. Patient was positioned prone on the fluoroscopy table. Local anesthesia was provided with 1% lidocaine without epinephrine after prepped and draped in the usual sterile fashion. Puncture was performed at L2-3 using a 3 1/2 inch 22-gauge spinal needle via MIDLINE (____Paramedian) approach. Using a single pass through the dura, the needle was placed within the thecal sac, with return of clear CSF. 15 mL of Isovue M-200 was injected into the thecal sac, with normal opacification of the nerve roots and cauda equina consistent with free flow within the  subarachnoid space. I personally performed the lumbar puncture and administered the intrathecal contrast. I also personally supervised acquisition of the myelogram images. TECHNIQUE: Contiguous axial images were obtained through the Lumbar spine after the intrathecal infusion of infusion. Coronal and sagittal reconstructions were obtained of the axial image sets. 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 There is moderate stenosis at the L5-S1 level with early truncation of the S1 nerve roots bilaterally. There is incomplete filling of the L5 nerve roots as well. CT LUMBAR MYELOGRAM FINDINGS: The lumbar spine is imaged from the midbody of T10 through the midbody of S3. Mild rightward curvature is centered at L2 and leftward curvature at L5-S1. Atherosclerotic calcifications are present in the aorta without aneurysm. The patient is status post cholecystectomy. No other solid organ lesions are present. There is no significant adenopathy. L1-2: Negative. L2-3: Negative. L3-4: A broad-based disc protrusion extends into the foramina bilaterally. Mild foraminal narrowing, worse on the left, is similar the prior study. L4-5: A broad-based disc protrusion is present. The disc extends into the left neural foramen. The central canal is patent. Mild left foraminal narrowing is present. L5-S1: A broad-based disc protrusion is present. There is significant progression since 2013. Moderate central canal stenosis is present. Disc material extends inferiorly the with asymmetric impact on the left S1 and S2 nerve roots. Moderate foraminal stenosis is worse right than left, with progression. No significant epidural hematoma or mass effect is present on the conus medullaris or nerve roots. Following the procedure, the patient was unable to walk. She profound weakness in both lower  extremities. 3 hours after the procedure she  was regaining some strength in her right greater than left great toe. She a persistent back pain extending into the thoracic region. Immediately following the procedure her pain was treated with Demerol 50 mg and Zofran 4 mg IM. She still had severe pain 3 hours later. She was transferred to the emergency department for further evaluation of her weakness and pain management without adequate support at home or the ability to ambulate yet at that point. IMPRESSION: 1. Progressive broad-based disc protrusion and inferior disc extrusion at L5-S1 with moderate central canal stenosis and subarticular narrowing, left greater than right. 2. Moderate foraminal narrowing at L5-S1 is worse on the right. 3. Mild left foraminal narrowing at L4-5 demonstrates some progression. 4. Mild foraminal narrowing bilaterally at L3-4 is stable. 5. Weakness in the lower extremities following the procedure. This is atypical with myelography. No extrinsic mass effect is evident. Patient was regaining some strength but unable to ambulate and was therefore transferred to the emergency department for further evaluation as well as pain management. Ambulance transfer was arranged. The case was discussed with the triage nurse at the [facility] emergency room as well as the neural hospitalist on-call. Electronically Signed   By: Marin Roberts M.D.   On: 03/03/2019 18:51   Mr Cervical Spine Wo Contrast  Result Date: 03/04/2019 CLINICAL DATA:  Focal neuro deficit with stroke suspected. Unable to ambulate. EXAM: MRI CERVICAL SPINE WITHOUT CONTRAST TECHNIQUE: Multiplanar, multisequence MR imaging of the cervical spine was performed. No intravenous contrast was administered. COMPARISON:  Cervical MRI 08/11/2011 FINDINGS: Alignment: Normal Vertebrae: Small hemangiomas noted in the C3 left articular process and T2 body. No fracture, discitis, or aggressive bone lesion Cord: Normal signal and morphology. Posterior Fossa, vertebral arteries,  paraspinal tissues: Negative. Disc levels: C5-6 mild disc narrowing with small central protrusion. Mild anterior endplate spurring. C6-7 and T1-2 minor annulus bulging. No impingement seen throughout the cervical spine IMPRESSION: Normal MRI of the cervical cord.  No explanation for weakness. Electronically Signed   By: Marnee Spring M.D.   On: 03/04/2019 10:20   Mr Thoracic Spine Wo Contrast  Result Date: 03/04/2019 CLINICAL DATA:  Sudden onset weakness after myelogram EXAM: MRI THORACIC SPINE WITHOUT CONTRAST TECHNIQUE: Multiplanar, multisequence MR imaging of the thoracic spine was performed. No intravenous contrast was administered. COMPARISON:  None similar FINDINGS: Alignment:  Normal Vertebrae: No fracture, evidence of discitis, or bone lesion. Cord:  Normal signal and morphology. Paraspinal and other soft tissues: Negative. Disc levels: Well preserved disc height and hydration. Negative facets. No impingement IMPRESSION: Negative.  Normal MRI of the thoracic cord. Electronically Signed   By: Marnee Spring M.D.   On: 03/04/2019 10:36   Mr Lumbar Spine Wo Contrast  Result Date: 03/04/2019 CLINICAL DATA:  Weakness and pain after lumbar myelogram EXAM: MRI LUMBAR SPINE WITHOUT CONTRAST TECHNIQUE: Multiplanar, multisequence MR imaging of the lumbar spine was performed. No intravenous contrast was administered. COMPARISON:  Lumbar myelogram CT from yesterday FINDINGS: Segmentation: Transitional S1 vertebra based on the lowest ribs and comparison study Alignment:  Slight retrolisthesis at L5-S1 Vertebrae: Discogenic edema and sclerosis at L5-S1, progressed since 2013. No fracture or discitis. Conus medullaris and cauda equina: Conus extends to the L1 level. Conus and cauda equina appear normal. Paraspinal and other soft tissues: Minimal muscular STIR hyperintensity left paracentral at L5-S1, presumably related to injection. Interspinous tiny bursa formation at L5-S1, also seen on prior. Common bile  duct dilatation seen since  at least 2014 CT and likely reservoir effect. Trace pelvic fluid which is likely physiologic given there is still ovarian follicle seen on the right. Disc levels: L5-S1 disc narrowing and bulge with subarticular recess crowding. Mild right foraminal narrowing due to disc bulge. IMPRESSION: 1. No evidence of complication and no explanation for acute weakness. 2. L5-S1 focal disc degeneration with subarticular recess and right foraminal narrowing. Electronically Signed   By: Marnee SpringJonathon  Watts M.D.   On: 03/04/2019 10:34   Dg Myelography Lumbar Inj Lumbosacral  Result Date: 03/03/2019 CLINICAL DATA:  Low back pain extending into the lower extremities bilaterally. EXAM: LUMBAR MYELOGRAM FLUOROSCOPY TIME:  Radiation Exposure Index (as provided by the fluoroscopic device): 237.95 uGy*m2 PROCEDURE: After thorough discussion of risks and benefits of the procedure including bleeding, infection, injury to nerves, blood vessels, adjacent structures as well as headache and CSF leak, written and oral informed consent was obtained. Consent was obtained by Dr. Marin Robertshristopher Mattern. Time out form was completed. Patient was positioned prone on the fluoroscopy table. Local anesthesia was provided with 1% lidocaine without epinephrine after prepped and draped in the usual sterile fashion. Puncture was performed at L2-3 using a 3 1/2 inch 22-gauge spinal needle via MIDLINE (____Paramedian) approach. Using a single pass through the dura, the needle was placed within the thecal sac, with return of clear CSF. 15 mL of Isovue M-200 was injected into the thecal sac, with normal opacification of the nerve roots and cauda equina consistent with free flow within the subarachnoid space. I personally performed the lumbar puncture and administered the intrathecal contrast. I also personally supervised acquisition of the myelogram images. TECHNIQUE: Contiguous axial images were obtained through the Lumbar spine after  the intrathecal infusion of infusion. Coronal and sagittal reconstructions were obtained of the axial image sets. 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 There is moderate stenosis at the L5-S1 level with early truncation of the S1 nerve roots bilaterally. There is incomplete filling of the L5 nerve roots as well. CT LUMBAR MYELOGRAM FINDINGS: The lumbar spine is imaged from the midbody of T10 through the midbody of S3. Mild rightward curvature is centered at L2 and leftward curvature at L5-S1. Atherosclerotic calcifications are present in the aorta without aneurysm. The patient is status post cholecystectomy. No other solid organ lesions are present. There is no significant adenopathy. L1-2: Negative. L2-3: Negative. L3-4: A broad-based disc protrusion extends into the foramina bilaterally. Mild foraminal narrowing, worse on the left, is similar the prior study. L4-5: A broad-based disc protrusion is present. The disc extends into the left neural foramen. The central canal is patent. Mild left foraminal narrowing is present. L5-S1: A broad-based disc protrusion is present. There is significant progression since 2013. Moderate central canal stenosis is present. Disc material extends inferiorly the with asymmetric impact on the left S1 and S2 nerve roots. Moderate foraminal stenosis is worse right than left, with progression. No significant epidural hematoma or mass effect is present on the conus medullaris or nerve roots. Following the procedure, the patient was unable to walk. She profound weakness in both lower extremities. 3 hours after the procedure she was regaining some strength in her right greater than left great toe. She a persistent back pain extending into the thoracic region. Immediately following the procedure her pain was treated with Demerol 50 mg and Zofran 4 mg IM. She still had severe  pain 3 hours later.  She was transferred to the emergency department for further evaluation of her weakness and pain management without adequate support at home or the ability to ambulate yet at that point. IMPRESSION: 1. Progressive broad-based disc protrusion and inferior disc extrusion at L5-S1 with moderate central canal stenosis and subarticular narrowing, left greater than right. 2. Moderate foraminal narrowing at L5-S1 is worse on the right. 3. Mild left foraminal narrowing at L4-5 demonstrates some progression. 4. Mild foraminal narrowing bilaterally at L3-4 is stable. 5. Weakness in the lower extremities following the procedure. This is atypical with myelography. No extrinsic mass effect is evident. Patient was regaining some strength but unable to ambulate and was therefore transferred to the emergency department for further evaluation as well as pain management. Ambulance transfer was arranged. The case was discussed with the triage nurse at the [facility] emergency room as well as the neural hospitalist on-call. Electronically Signed   By: San Morelle M.D.   On: 03/03/2019 18:51    Pending Labs Unresulted Labs (From admission, onward)   None      Vitals/Pain Today's Vitals   03/04/19 1114 03/04/19 1315 03/04/19 1622 03/04/19 1643  BP: 130/76 117/66 128/73   Pulse:  66 60   Resp:  18 18   Temp:      TempSrc:      SpO2:  100% 100%   PainSc:    8     Isolation Precautions No active isolations  Medications Medications  enoxaparin (LOVENOX) injection 40 mg (has no administration in time range)  atorvastatin (LIPITOR) tablet 40 mg (40 mg Oral Given 03/04/19 1114)  lisinopril (ZESTRIL) tablet 20 mg (20 mg Oral Given 03/04/19 1114)  methylPREDNISolone sodium succinate (SOLU-MEDROL) 40 mg/mL injection 40 mg (40 mg Intravenous Given 03/04/19 1116)  morphine 2 MG/ML injection 2 mg (2 mg Intravenous Given 03/04/19 1648)  acetaminophen (TYLENOL) tablet 650 mg (has no  administration in time range)  ondansetron (ZOFRAN) injection 4 mg (4 mg Intravenous Given 03/04/19 1349)  meperidine (DEMEROL) injection 25 mg (25 mg Intramuscular Given 03/03/19 2208)  tiZANidine (ZANAFLEX) tablet 4 mg (4 mg Oral Given 03/04/19 0049)  LORazepam (ATIVAN) injection 1 mg (1 mg Intravenous Given 03/04/19 0839)    Mobility walks with device High fall risk   Focused Assessments Neuro Assessment Handoff:        Neuro Assessment: Within Defined Limits Neuro Checks:      Last Documented NIHSS Modified Score:   Has TPA been given? No If patient is a Neuro Trauma and patient is going to OR before floor call report to [facility] nurse: [phone] or [phone]     R Recommendations: See Admitting Provider Note  Report given to:   Additional Notes:

## 2019-03-04 NOTE — Progress Notes (Addendum)
NEUROLOGY PROGRESS NOTE  Subjective: Patient's main complaint at this point is back pain, leg pain and allodynia of light touch.  She does states she can move her legs better today  Exam: Vitals:   03/04/19 0749 03/04/19 1057  BP: (!) 121/99 127/90  Pulse: (!) 57 71  Resp: 16 16  Temp:    SpO2: 98% 100%    ROS General ROS: negative for - chills, fatigue, fever, night sweats, weight gain or weight loss Psychological ROS: negative for - behavioral disorder, hallucinations, memory difficulties, mood swings or suicidal ideation Ophthalmic ROS: negative for - blurry vision, double vision, eye pain or loss of vision ENT ROS: negative for - epistaxis, nasal discharge, oral lesions, sore throat, tinnitus or vertigo Respiratory ROS: negative for - cough, hemoptysis, shortness of breath or wheezing Cardiovascular ROS: Positive for -chest pain to palpation Gastrointestinal ROS: Positive for -abdominal discomfort  Genito-Urinary ROS: negative for - dysuria, hematuria, incontinence or urinary frequency/urgency Musculoskeletal ROS: negative for - joint swelling or muscular weakness Neurological ROS: as noted in HPI Dermatological ROS: negative for rash and skin lesion changes      Neuro:   Physical Exam  Constitutional: Appears well-developed and well-nourished.  Psych: Affect appropriate to situation Eyes: No scleral injection HENT: No OP obstrucion Head: Normocephalic.  Cardiovascular: Normal rate and regular rhythm.  Respiratory: Effort normal, non-labored breathing GI: Soft.  No distension. There is no tenderness.  Skin: WDI    Mental Status: Alert, oriented, thought content appropriate.  Speech fluent without evidence of aphasia.  Able to follow 3 step commands without difficulty. Cranial Nerves: II:  Visual fields grossly normal,  III,IV, VI: ptosis not present, extra-ocular motions intact bilaterally pupils equal, round, reactive to light and accommodation V,VII: smile  symmetric, facial light touch sensation normal bilaterally VIII: hearing normal bilaterally IX,X: Palate rises midline XI: bilateral shoulder shrug XII: midline tongue extension Motor: Right : Upper extremity   5/5    Left:     Upper extremity   5/5  -Lower extremity exam was difficult as patient has pain throughout.  She is able to hold bilateral legs antigravity but let some drift to the bed.  Wit hip flexion, knee flexion, knee extension, dorsi flexion and plantar flexion with quick movements I did elicit 4/5 Tone and bulk:normal tone throughout; no atrophy noted Sensory: Patient has allodynia bilateral legs and lower back up to approximately T4.  Upper extremities she has no abnormal sensory issues Deep Tendon Reflexes: 2+ throughout the upper extremities lower extremities patient would not relax plantars: Right: downgoing   Left: downgoing Cerebellar: normal finger-to-nose,     Medications:  Scheduled: . atorvastatin  40 mg Oral Daily  . enoxaparin (LOVENOX) injection  40 mg Subcutaneous Daily  . lisinopril  20 mg Oral Daily  . methylPREDNISolone (SOLU-MEDROL) injection  40 mg Intravenous Daily   Continuous:   Pertinent Labs/Diagnostics:  Mr Cervical Spine Wo Contrast  Result Date: 03/04/2019  IMPRESSION: Normal MRI of the cervical cord.  No explanation for weakness. Electronically Signed   By: Marnee Spring M.D.   On: 03/04/2019 10:20   Mr Thoracic Spine Wo Contrast  Result Date: 03/04/2019  IMPRESSION: Negative.  Normal MRI of the thoracic cord. Electronically Signed   By: Marnee Spring M.D.   On: 03/04/2019 10:36   Mr Lumbar Spine Wo Contrast  Result Date: 03/04/2019  IMPRESSION: 1. No evidence of complication and no explanation for acute weakness. 2. L5-S1 focal disc degeneration with subarticular recess  and right foraminal narrowing. Electronically Signed   By: Monte Fantasia M.D.   On: 03/04/2019 10:34   Dg Myelography Lumbar Inj Lumbosacral  Result Date:  03/03/2019 . IMPRESSION: 1. Progressive broad-based disc protrusion and inferior disc extrusion at L5-S1 with moderate central canal stenosis and subarticular narrowing, left greater than right. 2. Moderate foraminal narrowing at L5-S1 is worse on the right. 3. Mild left foraminal narrowing at L4-5 demonstrates some progression. 4. Mild foraminal narrowing bilaterally at L3-4 is stable. 5. Weakness in the lower extremities following the procedure. This is atypical with myelography. No extrinsic mass effect is evident. Patient was regaining some strength but unable to ambulate and was therefore transferred to the emergency department for further evaluation as well as pain management. Ambulance transfer was arranged. The case was discussed with the triage nurse at the [facility] emergency room as well as the neural hospitalist on-call. Electronically Signed   By: San Morelle M.D.   On: 03/03/2019 18:51     Barbara Quill PA-C [facility] [phone]  I have seen the patient reviewed the above note.  Assessment: 53 year old female who presents with bilateral lower extremity weakness following myelogram.  With no evidence of structural injury such as conus injury, epidural hematoma, or other concerning findings of the spinal cord, I I do wonder if this was an unusual reaction to the procedure.  There is also a possibility of some overlay, but this is not definite.  With her improvement, I would expect continued improvement and with normal structural imaging, would expect good prognosis.  Recommendations: -At this point recommendations would be continue pain management as from a neurological standpoint MRI of neuro axis did not show any etiology of patient's weakness and pain. If patient continues to have severe pain of lower extremities may consider nerve conduction EMG as an outpatient -Continue pain management/may need out patient pain management -Physical therapy/Occupational  Therapy -Please call with further questions or concerns.  Roland Rack, MD [facility] [phone]  If 7pm- 7am, please page neurology on call as listed in [place]. 03/04/2019, 11:00 AM
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
## 2019-03-05 LAB — URINALYSIS, ROUTINE W REFLEX MICROSCOPIC
Bilirubin Urine: NEGATIVE
Glucose, UA: NEGATIVE mg/dL
Hgb urine dipstick: NEGATIVE
Ketones, ur: NEGATIVE mg/dL
Leukocytes,Ua: NEGATIVE
Nitrite: NEGATIVE
Protein, ur: NEGATIVE mg/dL
Specific Gravity, Urine: 1.002 — ABNORMAL LOW (ref 1.005–1.030)
pH: 7 (ref 5.0–8.0)

## 2019-03-05 LAB — VITAMIN B12: Vitamin B-12: 379 pg/mL (ref 180–914)

## 2019-03-05 MED ORDER — PREGABALIN 75 MG PO CAPS
75.0000 mg | ORAL_CAPSULE | Freq: Two times a day (BID) | ORAL | Status: DC
  Administered 2019-03-05 – 2019-03-06 (×2): 75 mg via ORAL
  Filled 2019-03-05 (×3): qty 1

## 2019-03-05 NOTE — Progress Notes (Signed)
PT Cancellation Note  Patient Details Name: Barbara Alvarez MRN: [id number] DOB: 1966-04-26   Cancelled Treatment:    Reason Eval/Treat Not Completed: Other (comment) Pt with n/v this am. On arrival to room, pt was very emotional about treatment while in the hospital. Sat with patient and heard her concerns before following up with RN who is aware of complaints and taking appropriate action. Will check back as time allows.   Benjiman Core, PTA Pager [phone] Acute Rehab  Allena Katz 03/05/2019, 1:01 PM
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
## 2019-03-05 NOTE — Plan of Care (Signed)
  Problem: Activity: Goal: Risk for activity intolerance will decrease Outcome: Progressing   
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
## 2019-03-06 ENCOUNTER — Ambulatory Visit (HOSPITAL_COMMUNITY)
Admission: EM | Admit: 2019-03-06 | Discharge: 2019-03-06 | Disposition: A | Discharge status: Home / Self Care | Payer: No Typology Code available for payment source | Source: Ambulatory Visit | Attending: Emergency Medicine | Admitting: Emergency Medicine

## 2019-03-06 DIAGNOSIS — Z0441 Encounter for examination and observation following alleged adult rape: Secondary | ICD-10-CM | POA: Insufficient documentation

## 2019-03-06 DIAGNOSIS — R200 Anesthesia of skin: Secondary | ICD-10-CM

## 2019-03-06 MED ORDER — PREGABALIN 75 MG PO CAPS: 75.0000 mg | ORAL_CAPSULE | Freq: Two times a day (BID) | ORAL | 0 refills | Status: DC

## 2019-03-06 MED ORDER — POLYETHYLENE GLYCOL 3350 17 G PO PACK: 17.0000 g | PACK | Freq: Every day | ORAL | 0 refills | Status: DC | PRN

## 2019-03-06 MED ORDER — ALBUTEROL SULFATE (2.5 MG/3ML) 0.083% IN NEBU
2.5000 mg | INHALATION_SOLUTION | Freq: Four times a day (QID) | RESPIRATORY_TRACT | Status: DC | PRN
  Administered 2019-03-06: 2.5 mg via RESPIRATORY_TRACT
  Filled 2019-03-06: qty 3

## 2019-03-06 MED ORDER — LORATADINE 10 MG PO TABS
10.0000 mg | ORAL_TABLET | Freq: Every day | ORAL | Status: DC
  Administered 2019-03-06: 10 mg via ORAL
  Filled 2019-03-06: qty 1

## 2019-03-06 MED ORDER — POLYETHYLENE GLYCOL 3350 17 G PO PACK
17.0000 g | PACK | Freq: Every day | ORAL | Status: DC
  Administered 2019-03-06: 17 g via ORAL
  Filled 2019-03-06: qty 1

## 2019-03-06 MED ORDER — SENNOSIDES-DOCUSATE SODIUM 8.6-50 MG PO TABS
1.0000 | ORAL_TABLET | Freq: Two times a day (BID) | ORAL | Status: DC
  Filled 2019-03-06: qty 1

## 2019-03-06 MED ORDER — ALPRAZOLAM 0.5 MG PO TABS
0.5000 mg | ORAL_TABLET | Freq: Once | ORAL | Status: AC
  Administered 2019-03-06: 0.5 mg via ORAL
  Filled 2019-03-06: qty 1
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
## 2019-03-06 NOTE — Progress Notes (Signed)
Occupational Therapy Treatment Patient Details Name: Barbara Alvarez MRN: [id number] DOB: 1965/10/12 Today's Date: 03/06/2019    History of present illness Pt presented to ED wtih acute onset back pain and LE pain and weakness following CT myelogram.  MRI of cervical and thoracic spine were normal.  MRI of lumbar spine with no evidence of complication and no explanation for acute weakness.  L5-S1 focal disc degeneration with subarticular recess and Rt foraminal narrowing.  PMH includes:  shoulder pain, HTN, HA, clausterphobia, chronic neck pain, chronic pain Lt foot, chronic back pain, bipolar disorder    OT comments  Pt making steady progress towards OT goals this session. Session focus on LB AE for bathing/ dressing. Pt c/o pain in low back with movement despite just being given pain meds. Issued pt LB AE and provided education on uses. Pt able to don socks from EOB with sock aid needing MOD A for sequencing of task. Demo'ed use of 3n1 as shower seat with pt verbalizing understanding. Pt likely to DC home today with no OT f/u. Will continue to follow per POC.    Follow Up Recommendations  No OT follow up;Supervision/Assistance - 24 hour    Equipment Recommendations  Tub/shower bench    Recommendations for Other Services      Precautions / Restrictions Precautions Precautions: Fall Restrictions Weight Bearing Restrictions: No       Mobility Bed Mobility Overal bed mobility: Needs Assistance Bed Mobility: Rolling;Sidelying to Sit;Sit to Sidelying Rolling: Supervision Sidelying to sit: Supervision     Sit to sidelying: Supervision General bed mobility comments: supervision for safety  Transfers                 General transfer comment: declined OOB transfer    Balance Overall balance assessment: Needs assistance Sitting-balance support: Feet supported Sitting balance-Leahy Scale: Good Sitting balance - Comments: able to don socks EOB                                    ADL either performed or assessed with clinical judgement   ADL Overall ADL's : Needs assistance/impaired       Grooming Details (indicate cue type and reason): declined grooming       Lower Body Bathing Details (indicate cue type and reason): issued LH sponge and provided education on uses     Lower Body Dressing: Moderate assistance;Sitting/lateral leans;Cueing for sequencing Lower Body Dressing Details (indicate cue type and reason): issued LB AE for bathing/ dressing; pt able to don socks with MOD A with sock aid , cues for sequencing of task; demo'ed use of reacher for LB dressing   Toilet Transfer Details (indicate cue type and reason): declined OOB transfer; education on using 3n1 over toilet       Tub/Shower Transfer Details (indicate cue type and reason): demo'ed use of 3n1 in shower with pt verbalizing understanding   General ADL Comments: session focus on LB AE for bathing/ dressing; MOD A to don socks with sock aid from EOB     Vision Baseline Vision/History: Wears glasses Wears Glasses: At all times Patient Visual Report: No change from baseline     Perception     Praxis      Cognition Arousal/Alertness: Awake/alert Behavior During Therapy: WFL for tasks assessed/performed Overall Cognitive Status: Within Functional Limits for tasks assessed  General Comments: c/o staff and wanting to DC- Rn aware        Exercises     Shoulder Instructions       General Comments      Pertinent Vitals/ Pain       Pain Assessment: Faces Faces Pain Scale: Hurts little more Pain Location: back pain with movement Pain Descriptors / Indicators: Grimacing;Discomfort Pain Intervention(s): Limited activity within patient's tolerance;Monitored during session;Repositioned  Home Living                                          Prior Functioning/Environment              Frequency   Min 2X/week        Progress Toward Goals  OT Goals(current goals can now be found in the care plan section)  Progress towards OT goals: Progressing toward goals  Acute Rehab OT Goals Patient Stated Goal: to have less pain  OT Goal Formulation: With patient Time For Goal Achievement: 03/11/19 Potential to Achieve Goals: Good  Plan Discharge plan remains appropriate    Co-evaluation                 AM-PAC OT "6 Clicks" Daily Activity     Outcome Measure   Help from another person eating meals?: None Help from another person taking care of personal grooming?: A Little Help from another person toileting, which includes using toliet, bedpan, or urinal?: A Little Help from another person bathing (including washing, rinsing, drying)?: A Little Help from another person to put on and taking off regular upper body clothing?: A Little Help from another person to put on and taking off regular lower body clothing?: A Little 6 Click Score: 19    End of Session Equipment Utilized During Treatment: Other (comment)(LB AE)  OT Visit Diagnosis: Unsteadiness on feet (R26.81);Pain Pain - Right/Left: Right Pain - part of body: Leg   Activity Tolerance Patient tolerated treatment well   Patient Left in bed;with call bell/phone within reach   Nurse Communication          Time: 5053-9767 OT Time Calculation (min): 12 min  Charges: OT General Charges $OT Visit: 1 Visit OT Treatments $Self Care/Home Management : 8-22 mins  Pollyann [place], COTA/L Acute Rehabilitation Services [phone]    Angelina Pih 03/06/2019, 11:39 AM

## 2019-03-06 NOTE — TOC Transition Note (Signed)
Transition of Care [facility]) - CM/SW Discharge Note   Patient Details  Name: GETSEMANI LINDON MRN: [id number] Date of Birth: 1965/09/29  Transition of Care [facility]) CM/SW Contact:  Carles Collet, RN Phone Number: 03/06/2019, 10:43 AM   Clinical Narrative:    Spoke w patient, discussed Medicaid coverage for [facility] PT unlikely and at best would only get 2-3 home visits. She would prefer to continue w her PT at [facility]. Referral made through [place], specifying her request to continue w Lauren who was working on getting more visits approved per the patient. Referral/ Order through Epic should suffice/ expedite this. Patient would like RW and 3/1 for home use. CM ordered and requested for room delivery. Her son will get off work at 2 and she states he will provide transport home. No other CM needs identified.     Final next level of care: Home/Self Care Barriers to Discharge: No Barriers Identified   Patient Goals and CMS Choice        Discharge Placement                       Discharge Plan and Services                DME Arranged: 3-N-1, Walker rolling DME Agency: [employer] Date DME Agency Contacted: 03/06/19 Time DME Agency Contacted: 33 Representative spoke with at DME Agency: [email or web address]            Social Determinants of Health ([place]) Interventions     Readmission Risk Interventions No flowsheet data found.

## 2019-03-06 NOTE — Progress Notes (Signed)
DISCHARGE NOTE HOME Brigette B Dorval to be discharged Home per MD order. Discussed prescriptions and follow up appointments with the patient. Prescriptions given to patient; medication list explained in detail. Patient verbalized understanding.  Skin clean, dry and intact without evidence of skin break down, no evidence of skin tears noted. IV catheter discontinued intact. Site without signs and symptoms of complications. Dressing and pressure applied. Pt denies pain at the site currently. No complaints noted.  Patient free of lines, drains, and wounds.   An After Visit Summary (AVS) was printed and given to the patient. Patient escorted via wheelchair, and discharged home via private auto.  Paulla Fore, RN

## 2019-03-13 ENCOUNTER — Other Ambulatory Visit: Payer: Self-pay

## 2019-03-13 ENCOUNTER — Ambulatory Visit (INDEPENDENT_AMBULATORY_CARE_PROVIDER_SITE_OTHER): Payer: Medicaid Other | Admitting: Physician Assistant

## 2019-03-13 ENCOUNTER — Encounter: Payer: Self-pay | Admitting: Physician Assistant

## 2019-03-13 DIAGNOSIS — M5416 Radiculopathy, lumbar region: Secondary | ICD-10-CM

## 2019-03-13 NOTE — SANE Note (Signed)
Updated Daisy Blossom with [facility] on services offered and provided to patient.
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
## 2019-03-14 ENCOUNTER — Other Ambulatory Visit: Payer: Self-pay | Admitting: Radiology

## 2019-03-14 DIAGNOSIS — R200 Anesthesia of skin: Secondary | ICD-10-CM

## 2019-03-14 DIAGNOSIS — M545 Low back pain, unspecified: Secondary | ICD-10-CM

## 2019-03-14 DIAGNOSIS — R202 Paresthesia of skin: Secondary | ICD-10-CM

## 2019-03-20 ENCOUNTER — Other Ambulatory Visit: Payer: Self-pay

## 2019-03-20 ENCOUNTER — Encounter: Payer: Self-pay | Admitting: Specialist

## 2019-03-20 ENCOUNTER — Ambulatory Visit (INDEPENDENT_AMBULATORY_CARE_PROVIDER_SITE_OTHER): Payer: Medicaid Other

## 2019-03-20 ENCOUNTER — Ambulatory Visit (INDEPENDENT_AMBULATORY_CARE_PROVIDER_SITE_OTHER): Payer: Medicaid Other | Admitting: Specialist

## 2019-03-20 VITALS — BP 140/80 | HR 80 | Ht 66.0 in | Wt 170.0 lb

## 2019-03-20 DIAGNOSIS — M5416 Radiculopathy, lumbar region: Secondary | ICD-10-CM

## 2019-03-20 DIAGNOSIS — M5116 Intervertebral disc disorders with radiculopathy, lumbar region: Secondary | ICD-10-CM | POA: Diagnosis not present

## 2019-03-20 DIAGNOSIS — M5137 Other intervertebral disc degeneration, lumbosacral region: Secondary | ICD-10-CM | POA: Diagnosis not present

## 2019-03-20 MED ORDER — HYDROCODONE-ACETAMINOPHEN 5-325 MG PO TABS: 1.0000 | ORAL_TABLET | Freq: Four times a day (QID) | ORAL | 0 refills | Status: DC | PRN

## 2019-03-20 NOTE — Progress Notes (Signed)
Office Visit Note   Patient: Barbara Alvarez           Date of Birth: 1965/12/27           MRN: [id number] Visit Date: 03/20/2019              Requested by: Pete Pelt, PA-C [address] [place],  [place] [address] PCP: Lin Landsman, MD   Assessment & Plan: Visit Diagnoses:  1. Lumbar radicular pain   2. Lumbar disc herniation with radiculopathy   3. Disc disease, degenerative, lumbar or lumbosacral     Plan: Avoid bending, stooping and avoid lifting weights greater than 10 lbs. Avoid prolong standing and walking. Order for a new walker with wheels. Surgery scheduling secretary Kandice Hams, will call you in the next week to schedule for surgery.  Surgery recommended is a one level lumbar fusion L5-S1  this would be done with rods, screws and cages with local bone graft and allograft (donor bone graft). Take hydrocodone for for pain. Risk of surgery includes risk of infection 1 in 200 patients, bleeding 1/2% chance you would need a transfusion.   Risk to the nerves is one in 10,000. You will need to use a brace for 3 months and wean from the brace on the 4th month. Expect improved walking and standing tolerance. Expect relief of leg pain but numbness may persist depending on the length and degree of pressure that has been present.    Follow-Up Instructions: No follow-ups on file.   Orders:  Orders Placed This Encounter  Procedures  . XR Lumb Spine Flex&Ext Only   Meds ordered this encounter  Medications  . HYDROcodone-acetaminophen (NORCO/VICODIN) 5-325 MG tablet    Sig: Take 1 tablet by mouth every 6 (six) hours as needed for moderate pain.    Dispense:  30 tablet    Refill:  0      Procedures: No procedures performed   Clinical Data: Findings:  CLINICAL DATA: Low back pain extending into the lower extremities bilaterally.  EXAM: LUMBAR MYELOGRAM  FLUOROSCOPY TIME: Radiation Exposure Index (as provided by the fluoroscopic device):  237.95 uGy*m2  PROCEDURE: After thorough discussion of risks and benefits of the procedure including bleeding, infection, injury to nerves, blood vessels, adjacent structures as well as headache and CSF leak, written and oral informed consent was obtained. Consent was obtained by Dr. San Morelle. Time out form was completed.  Patient was positioned prone on the fluoroscopy table. Local anesthesia was provided with 1% lidocaine without epinephrine after prepped and draped in the usual sterile fashion. Puncture was performed at L2-3 using a 3 1/2 inch 22-gauge spinal needle via MIDLINE (____Paramedian) approach. Using a single pass through the dura, the needle was placed within the thecal sac, with return of clear CSF. 15 mL of Isovue M-200 was injected into the thecal sac, with normal opacification of the nerve roots and cauda equina consistent with free flow within the subarachnoid space.  I personally performed the lumbar puncture and administered the intrathecal contrast. I also personally supervised acquisition of the myelogram images.  TECHNIQUE: Contiguous axial images were obtained through the Lumbar spine after the intrathecal infusion of infusion. Coronal and sagittal reconstructions were obtained of the axial image sets.  COMPARISON: MRI of the lumbar spine 07/06/2011  FINDINGS: LUMBAR MYELOGRAM FINDINGS:  Transitional anatomy is again noted with a transitional S1 segment. There is moderate stenosis at the L5-S1 level with early truncation of the S1 nerve roots bilaterally.  There is incomplete filling of the L5 nerve roots as well.  CT LUMBAR MYELOGRAM FINDINGS:  The lumbar spine is imaged from the midbody of T10 through the midbody of S3. Mild rightward curvature is centered at L2 and leftward curvature at L5-S1.  Atherosclerotic calcifications are present in the aorta without aneurysm. The patient is status post cholecystectomy. No other solid organ  lesions are present. There is no significant adenopathy.  L1-2: Negative.  L2-3: Negative.  L3-4: A broad-based disc protrusion extends into the foramina bilaterally. Mild foraminal narrowing, worse on the left, is similar the prior study.  L4-5: A broad-based disc protrusion is present. The disc extends into the left neural foramen. The central canal is patent. Mild left foraminal narrowing is present.  L5-S1: A broad-based disc protrusion is present. There is significant progression since 2013. Moderate central canal stenosis is present. Disc material extends inferiorly the with asymmetric impact on the left S1 and S2 nerve roots. Moderate foraminal stenosis is worse right than left, with progression.  No significant epidural hematoma or mass effect is present on the conus medullaris or nerve roots.  Following the procedure, the patient was unable to walk. She profound weakness in both lower extremities. 3 hours after the procedure she was regaining some strength in her right greater than left great toe. She a persistent back pain extending into the thoracic region. Immediately following the procedure her pain was treated with Demerol 50 mg and Zofran 4 mg IM. She still had severe pain 3 hours later.  She was transferred to the emergency department for further evaluation of her weakness and pain management without adequate support at home or the ability to ambulate yet at that point.  IMPRESSION: 1. Progressive broad-based disc protrusion and inferior disc extrusion at L5-S1 with moderate central canal stenosis and subarticular narrowing, left greater than right. 2. Moderate foraminal narrowing at L5-S1 is worse on the right. 3. Mild left foraminal narrowing at L4-5 demonstrates some progression. 4. Mild foraminal narrowing bilaterally at L3-4 is stable. 5. Weakness in the lower extremities following the procedure. This is atypical with myelography. No extrinsic mass  effect is evident. Patient was regaining some strength but unable to ambulate and was therefore transferred to the emergency department for further evaluation as well as pain management. Ambulance transfer was arranged. The case was discussed with the triage nurse at the [facility] emergency room as well as the neural hospitalist on-call.   Electronically Signed By: Marin Roberts M.D. On: 03/03/2019 18:51    Subjective: Chief Complaint  Patient presents with  . Lower Back - Pain    53 year old female with 2-3 year history of back pain and radiation into the left greater than right leg. With the left leg having had a worsening of pain since the time of the lumbar myelogram and post myelogram CT scan. It not just pain it is sharp pain shooting up both Legs up to her back with tingling in the toes all the toew both feet. The pain is worse with sitting standing or lying. No bowel and bladder difficulty. There is pain with walking that limits her walking. She stays in a hotel and has been staying there for nearly 1.5 years trying to find a home so she can get out of the hotel. The pain is a "10" on a scale of 1-10. Since she saw Dr. Chestine Spore her pain has gotten worse. She has had         Review of  Systems  Constitutional: Negative.  Negative for activity change, appetite change, chills, diaphoresis, fatigue, fever and unexpected weight change.  HENT: Positive for hearing loss (since 53 years old) and tinnitus. Negative for congestion, dental problem, drooling, ear discharge, ear pain, facial swelling, mouth sores, nosebleeds, postnasal drip, rhinorrhea, sinus pressure, sinus pain, sneezing, sore throat, trouble swallowing and voice change.   Eyes: Positive for itching. Negative for photophobia, pain, discharge, redness and visual disturbance.  Respiratory: Negative.   Cardiovascular: Negative.   Gastrointestinal: Negative.   Endocrine: Negative.   Genitourinary: Negative.   Negative for difficulty urinating, dyspareunia, dysuria, enuresis, frequency and hematuria.  Musculoskeletal: Positive for back pain and gait problem. Negative for arthralgias, joint swelling, myalgias, neck pain and neck stiffness.  Skin: Negative.   Allergic/Immunologic: Positive for environmental allergies.  Neurological: Positive for weakness and numbness. Negative for dizziness, tremors, seizures, syncope, facial asymmetry, speech difficulty, light-headedness and headaches.  Hematological: Negative.  Negative for adenopathy. Does not bruise/bleed easily.  Psychiatric/Behavioral: Negative.  Negative for agitation, behavioral problems, confusion, decreased concentration, dysphoric mood, hallucinations, self-injury, sleep disturbance and suicidal ideas. The patient is not nervous/anxious and is not hyperactive.      Objective: Vital Signs: BP 140/80 (BP Location: Left Arm, Patient Position: Sitting)   Pulse 80   Ht 5\' 6"  (1.676 m)   Wt 170 lb (77.1 kg)   LMP 09/06/2016   BMI 27.44 kg/m   Physical Exam Constitutional:      Appearance: She is well-developed.  HENT:     Head: Normocephalic and atraumatic.  Eyes:     Pupils: Pupils are equal, round, and reactive to light.  Neck:     Musculoskeletal: Normal range of motion and neck supple.  Pulmonary:     Effort: Pulmonary effort is normal.     Breath sounds: Normal breath sounds.  Abdominal:     General: Bowel sounds are normal.     Palpations: Abdomen is soft.  Skin:    General: Skin is warm and dry.  Neurological:     Mental Status: She is alert and oriented to person, place, and time.  Psychiatric:        Behavior: Behavior normal.        Thought Content: Thought content normal.        Judgment: Judgment normal.     Back Exam   Tenderness  The patient is experiencing tenderness in the lumbar.  Range of Motion  Extension: abnormal  Lateral bend right: abnormal  Lateral bend left: abnormal  Rotation right:  abnormal  Rotation left: abnormal   Muscle Strength  Right Quadriceps:  5/5  Right Hamstrings:  5/5  Left Hamstrings:  5/5   Reflexes  Patellar: 1/4 Achilles: 0/4 Babinski's sign: normal   Other  Gait: abnormal   Comments:  Weakness left foot DF and PF 4/5, left ankle reflex is 0      Specialty Comments:  No specialty comments available.  Imaging: Xr Lumb Spine Flex&ext Only  Result Date: 03/20/2019 AP and lateral flexion and extension radiographs show narrowing of the L5-S1 disc, transition level S1-S2. Minimal retrolisthesis L5-S1. MRI with modic changes L5-S1 with disc protrusion to the left at the L5-S1 level narrowing the left L5-S1 lateral recess and causing S1 nerve compression.     PMFS History: Patient Active Problem List   Diagnosis Date Noted  . Numbness and tingling of leg   . Pain in both lower legs 03/04/2019  . Cervical spine pain 01/21/2019  .  Pain in right finger(s) 11/22/2016  . Finger pain, left 11/22/2016  . Chronic right shoulder pain 11/22/2016  . Essential hypertension 05/30/2016  . Right shoulder injury, initial encounter 05/30/2016  . Seasonal allergic rhinitis 05/30/2016  . Muscle spasm of right shoulder 05/30/2016  . Need for immunization against influenza 05/30/2016  . Bipolar disorder (HCC) 05/30/2016  . Acquired mallet finger of right hand 06/15/2015   Past Medical History:  Diagnosis Date  . Bipolar 1 disorder (HCC)   . Bronchitis   . Chronic back pain   . Chronic dental pain   . Chronic neck pain   . Chronic pain in left foot    since great toe fx  . Claustrophobia   . Headache   . Hearing loss in right ear   . Hypertension   . Shoulder pain     Family History  Problem Relation Age of Onset  . Cancer Mother   . Hypertension Father   . Diabetes Other     Past Surgical History:  Procedure Laterality Date  . CHOLECYSTECTOMY    . LIGAMENT REPAIR     left arm   Social History   Occupational History  . Not on  file  Tobacco Use  . Smoking status: Current Every Day Smoker    Packs/day: 0.25    Types: Cigarettes  . Smokeless tobacco: Never Used  Substance and Sexual Activity  . Alcohol use: Yes    Comment: hasn't had drink in about 1 wk  . Drug use: No  . Sexual activity: Not on file

## 2019-03-20 NOTE — Patient Instructions (Addendum)
Avoid bending, stooping and avoid lifting weights greater than 10 lbs. Avoid prolong standing and walking. Order for a new walker with wheels. Surgery scheduling secretary Sherri Billings, will call you in the next week to schedule for surgery.  Surgery recommended is a one level lumbar fusion L5-S1 this would be done with rods, screws and cages with local bone graft and allograft (donor bone graft). Take hydrocodone for for pain. Risk of surgery includes risk of infection 1 in 200 patients, bleeding 1/2% chance you would need a transfusion.   Risk to the nerves is one in 10,000. You will need to use a brace for 3 months and wean from the brace on the 4th month. Expect improved walking and standing tolerance. Expect relief of leg pain but numbness may persist depending on the length and degree of pressure that has been present.     

## 2019-04-25 ENCOUNTER — Ambulatory Visit: Payer: Medicaid Other | Admitting: Specialist

## 2019-05-28 ENCOUNTER — Ambulatory Visit: Payer: Medicaid Other | Admitting: Physician Assistant

## 2019-06-02 ENCOUNTER — Other Ambulatory Visit: Payer: Self-pay

## 2019-06-02 ENCOUNTER — Encounter: Payer: Self-pay | Admitting: Physician Assistant

## 2019-06-02 ENCOUNTER — Ambulatory Visit (INDEPENDENT_AMBULATORY_CARE_PROVIDER_SITE_OTHER): Payer: Medicaid Other | Admitting: Physician Assistant

## 2019-06-02 DIAGNOSIS — M5416 Radiculopathy, lumbar region: Secondary | ICD-10-CM

## 2019-06-02 MED ORDER — HYDROCODONE-ACETAMINOPHEN 5-325 MG PO TABS: 1.0000 | ORAL_TABLET | Freq: Four times a day (QID) | ORAL | 0 refills | Status: DC | PRN

## 2019-06-02 NOTE — Progress Notes (Signed)
HPI: Mrs. Barbara Alvarez comes in today unsure why she is actually here.  Pain down her left leg is starting to have some right sided low back pain.  She seemed not making his discussed surgical intervention with her.  She is scheduled to follow-up with Zonia Kief hopefully to discuss surgical intervention.  I did give her a small quantity of Norco today and she has had no pain medicine since November is having severe pain.  No charge for today's office visit.

## 2019-06-05 ENCOUNTER — Telehealth: Payer: Self-pay

## 2019-06-05 ENCOUNTER — Ambulatory Visit: Payer: Medicaid Other | Admitting: Surgery

## 2019-06-05 NOTE — Telephone Encounter (Signed)
2nd call to patient today to discuss scheduling surgery.  Left voice mail for return call.

## 2019-07-17 ENCOUNTER — Telehealth: Payer: Self-pay

## 2019-07-17 NOTE — Telephone Encounter (Signed)
I called patient and left voice mail for return call to discuss scheduling surgery.

## 2019-08-21 ENCOUNTER — Encounter: Payer: Self-pay | Admitting: Specialist

## 2019-08-21 ENCOUNTER — Ambulatory Visit (INDEPENDENT_AMBULATORY_CARE_PROVIDER_SITE_OTHER): Payer: Medicaid Other | Admitting: Specialist

## 2019-08-21 ENCOUNTER — Other Ambulatory Visit: Payer: Self-pay

## 2019-08-21 VITALS — BP 141/94 | HR 83 | Ht 66.0 in | Wt 170.0 lb

## 2019-08-21 DIAGNOSIS — M5416 Radiculopathy, lumbar region: Secondary | ICD-10-CM | POA: Diagnosis not present

## 2019-08-21 DIAGNOSIS — M4807 Spinal stenosis, lumbosacral region: Secondary | ICD-10-CM | POA: Diagnosis not present

## 2019-08-21 DIAGNOSIS — M5137 Other intervertebral disc degeneration, lumbosacral region: Secondary | ICD-10-CM | POA: Diagnosis not present

## 2019-08-21 DIAGNOSIS — M5116 Intervertebral disc disorders with radiculopathy, lumbar region: Secondary | ICD-10-CM

## 2019-08-21 MED ORDER — PREGABALIN 75 MG PO CAPS: 75.0000 mg | ORAL_CAPSULE | Freq: Two times a day (BID) | ORAL | 0 refills | Status: AC

## 2019-08-21 MED ORDER — PREGABALIN 75 MG PO CAPS: 75.0000 mg | ORAL_CAPSULE | Freq: Two times a day (BID) | ORAL | 0 refills | Status: DC

## 2019-08-21 NOTE — Addendum Note (Signed)
Addended by: Vira Browns on: 08/21/2019 02:02 PM   Modules accepted: Orders
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
## 2019-09-19 ENCOUNTER — Ambulatory Visit: Payer: Medicaid Other | Admitting: Specialist

## 2019-11-13 NOTE — Telephone Encounter (Signed)
In error

## 2020-01-08 ENCOUNTER — Encounter (HOSPITAL_COMMUNITY): Payer: Self-pay

## 2020-01-08 ENCOUNTER — Other Ambulatory Visit: Payer: Self-pay

## 2020-01-08 ENCOUNTER — Emergency Department (HOSPITAL_COMMUNITY)
Admission: EM | Admit: 2020-01-08 | Discharge: 2020-01-08 | Disposition: A | Discharge status: Home / Self Care | Payer: Medicaid Other | Attending: Emergency Medicine | Admitting: Emergency Medicine

## 2020-01-08 DIAGNOSIS — Z5321 Procedure and treatment not carried out due to patient leaving prior to being seen by health care provider: Secondary | ICD-10-CM | POA: Diagnosis not present

## 2020-01-08 DIAGNOSIS — M545 Low back pain: Secondary | ICD-10-CM | POA: Insufficient documentation

## 2020-01-08 NOTE — ED Triage Notes (Addendum)
Pt presents lower back pain goes between Left and Right side, currently pain to Right side x1 year. Pt also reports her allergies are "messed up"she has watery eyes and runny nose x2 days.  Pt tried to see her PCP they told her it would be 1-2 months before they could see her

## 2020-01-08 NOTE — ED Notes (Signed)
caslled patient to take back patient didn't answer

## 2020-01-10 ENCOUNTER — Emergency Department (HOSPITAL_COMMUNITY)
Admission: EM | Admit: 2020-01-10 | Discharge: 2020-01-10 | Disposition: A | Discharge status: Home / Self Care | Payer: Medicaid Other | Attending: Emergency Medicine | Admitting: Emergency Medicine

## 2020-01-10 ENCOUNTER — Other Ambulatory Visit: Payer: Self-pay

## 2020-01-10 ENCOUNTER — Encounter (HOSPITAL_COMMUNITY): Payer: Self-pay | Admitting: Emergency Medicine

## 2020-01-10 DIAGNOSIS — J3489 Other specified disorders of nose and nasal sinuses: Secondary | ICD-10-CM | POA: Diagnosis not present

## 2020-01-10 DIAGNOSIS — M5442 Lumbago with sciatica, left side: Secondary | ICD-10-CM | POA: Insufficient documentation

## 2020-01-10 DIAGNOSIS — F17210 Nicotine dependence, cigarettes, uncomplicated: Secondary | ICD-10-CM | POA: Insufficient documentation

## 2020-01-10 DIAGNOSIS — G8929 Other chronic pain: Secondary | ICD-10-CM | POA: Insufficient documentation

## 2020-01-10 DIAGNOSIS — Z79899 Other long term (current) drug therapy: Secondary | ICD-10-CM | POA: Diagnosis not present

## 2020-01-10 DIAGNOSIS — H04209 Unspecified epiphora, unspecified lacrimal gland: Secondary | ICD-10-CM | POA: Insufficient documentation

## 2020-01-10 DIAGNOSIS — K0889 Other specified disorders of teeth and supporting structures: Secondary | ICD-10-CM | POA: Insufficient documentation

## 2020-01-10 DIAGNOSIS — I10 Essential (primary) hypertension: Secondary | ICD-10-CM | POA: Diagnosis not present

## 2020-01-10 DIAGNOSIS — M545 Low back pain: Secondary | ICD-10-CM | POA: Diagnosis present

## 2020-01-10 DIAGNOSIS — H04203 Unspecified epiphora, bilateral lacrimal glands: Secondary | ICD-10-CM

## 2020-01-10 MED ORDER — METHOCARBAMOL 500 MG PO TABS: 500.0000 mg | ORAL_TABLET | Freq: Two times a day (BID) | ORAL | 0 refills | Status: DC

## 2020-01-10 MED ORDER — LIDOCAINE VISCOUS HCL 2 % MT SOLN: 15.0000 mL | OROMUCOSAL | 2 refills | Status: DC | PRN

## 2020-01-10 MED ORDER — FLUTICASONE PROPIONATE 50 MCG/ACT NA SUSP: 2.0000 | Freq: Every day | NASAL | 2 refills | Status: DC

## 2020-01-10 MED ORDER — LIDOCAINE 5 % EX PTCH: 1.0000 | MEDICATED_PATCH | CUTANEOUS | 0 refills | Status: DC

## 2020-01-10 MED ORDER — PREDNISONE 10 MG (21) PO TBPK: ORAL_TABLET | ORAL | 0 refills | Status: DC

## 2020-01-10 NOTE — ED Triage Notes (Signed)
Pt reports flare-up of chronic lower back pain since Tuesday.  States she came to ED on Wednesday and LWBS due to wait.  Also reports allergies are bothering her- runny nose and watery eyes.

## 2020-01-10 NOTE — ED Provider Notes (Signed)
[facility] EMERGENCY DEPARTMENT Provider Note   CSN: [id number] Arrival date & time: 01/10/20  1231     History Chief Complaint  Patient presents with  . Back Pain    Barbara Alvarez is a 54 y.o. female.  HPI      Barbara Alvarez is a 54 y.o. female, with a history of chronic back pain, chronic neck pain, HTN, bipolar, presenting to the ED with 3 complaints: Acute on chronic lower back pain that flared 3 days ago, seasonal/environmental allergies that are chronic, right upper dental pain for the last couple weeks.  Pain in the lower back is worse on the left, but bilateral, described as a tightness with some spasms, moderate to severe, radiating down the back of the left leg. Accompanied by tingling occasionally in the left leg.  She has had similar symptoms in the past for which she receives evaluation and care from Dr. Otelia Sergeant, orthopedic surgery. 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 Denies fever/chills, abdominal pain, changes in bowel or bladder function, saddle anesthesias, N/V/D.  Patient complains of symptoms she associates with environmental and seasonal allergies which she has had for years.  She has been experiencing rhinorrhea, sneezing, watery eyes, nasal congestion, and pressure in the ears.  She has been taking Zyrtec daily.  Lastly, she complains of at least a couple weeks of right upper dental pain, aching/throbbing, moderate to severe, nonradiating from this location.   Denies dizziness, syncope, chest pain, shortness of breath, cough, or any other complaints.  Past Medical History:  Diagnosis Date  . Bipolar 1 disorder (HCC)   . Bronchitis   . Chronic back pain   . Chronic dental pain   . Chronic neck pain   . Chronic pain in left foot    since great toe fx  . Claustrophobia   . Headache   . Hearing loss in right ear   . Hypertension   . Shoulder pain     Patient  Active Problem List   Diagnosis Date Noted  . Numbness and tingling of leg   . Pain in both lower legs 03/04/2019  . Cervical spine pain 01/21/2019  . Pain in right finger(s) 11/22/2016  . Finger pain, left 11/22/2016  . Chronic right shoulder pain 11/22/2016  . Essential hypertension 05/30/2016  . Right shoulder injury, initial encounter 05/30/2016  . Seasonal allergic rhinitis 05/30/2016  . Muscle spasm of right shoulder 05/30/2016  . Need for immunization against influenza 05/30/2016  . Bipolar disorder (HCC) 05/30/2016  . Acquired mallet finger of right hand 06/15/2015    Past Surgical History:  Procedure Laterality Date  . CHOLECYSTECTOMY    . LIGAMENT REPAIR     left arm     OB History   No obstetric history on file.     Family History  Problem Relation Age of Onset  . Cancer Mother   . Hypertension Father   . Diabetes Other     Social History   Tobacco Use  . Smoking status: Current Every Day Smoker    Packs/day: 0.25    Types: Cigarettes  . Smokeless tobacco: Never Used  Vaping Use  . Vaping Use: Never used  Substance Use Topics  . Alcohol use: Yes    Comment: hasn't had drink in about 1 wk  . Drug use: No    Home Medications Prior to Admission medications   Medication  Sig Start Date End Date Taking? Authorizing Provider  albuterol (PROVENTIL HFA;VENTOLIN HFA) 108 (90 Base) MCG/ACT inhaler Inhale 2 puffs into the lungs every 6 (six) hours as needed for wheezing or shortness of breath. 05/30/16   Hedges, Tinnie Gens, PA-C  albuterol (PROVENTIL) (2.5 MG/3ML) 0.083% nebulizer solution Take 3 mLs (2.5 mg total) by nebulization every 6 (six) hours as needed for wheezing or shortness of breath. 05/30/16   Hedges, Tinnie Gens, PA-C  atorvastatin (LIPITOR) 40 MG tablet Take 1 tablet (40 mg total) daily by mouth. 03/14/17   Bing Neighbors, FNP  carbamazepine (EQUETRO) 200 MG CP12 12 hr capsule Take 1 capsule (200 mg total) by mouth at bedtime. 09/07/14   Marlon Pel, PA-C  cetirizine (ZYRTEC) 10 MG tablet Take 1 tablet (10 mg total) by mouth daily. 12/17/17   Tilden Fossa, MD  clonazePAM (KLONOPIN) 1 MG tablet Take 1 mg by mouth 3 (three) times daily as needed for anxiety.    [provider]  fluticasone (FLONASE) 50 MCG/ACT nasal spray Place 2 sprays into both nostrils daily. 01/10/20   Jermisha Hoffart C, PA-C  HYDROcodone-acetaminophen (NORCO/VICODIN) 5-325 MG tablet Take 1 tablet by mouth every 6 (six) hours as needed for moderate pain. 06/02/19   Kirtland Bouchard, PA-C  hydrOXYzine (ATARAX/VISTARIL) 25 MG tablet Take 1-2 tablets (25-50 mg total) by mouth every 6 (six) hours as needed for itching. 09/20/16   Bing Neighbors, FNP  lidocaine (LIDODERM) 5 % Place 1 patch onto the skin daily. Remove & Discard patch within 12 hours or as directed by MD 01/10/20   Polette Nofsinger C, PA-C  lidocaine (XYLOCAINE) 2 % solution Use as directed 15 mLs in the mouth or throat as needed for mouth pain. 01/10/20   Braydin Aloi C, PA-C  lisinopril (PRINIVIL,ZESTRIL) 20 MG tablet Take 1 tablet (20 mg total) by mouth daily. 11/20/16   Bing Neighbors, FNP  methocarbamol (ROBAXIN) 500 MG tablet Take 1 tablet (500 mg total) by mouth 2 (two) times daily. 01/10/20   Ronie Fleeger C, PA-C  polyethylene glycol (MIRALAX / GLYCOLAX) 17 g packet Take 17 g by mouth daily as needed for mild constipation. 03/06/19   Zannie Cove, MD  predniSONE (STERAPRED UNI-PAK 21 TAB) 10 MG (21) TBPK tablet Take 6 tabs (60mg ) day 1, 5 tabs (50mg ) day 2, 4 tabs (40mg ) day 3, 3 tabs (30mg ) day 4, 2 tabs (20mg ) day 5, and 1 tab (10mg ) day 6. 01/10/20   Jasey Cortez C, PA-C  pregabalin (LYRICA) 75 MG capsule Take 1 capsule (75 mg total) by mouth 2 (two) times daily. 08/21/19   , MD  risperiDONE (RISPERDAL) 0.5 MG tablet Take 0.5 mg by mouth 2 (two) times daily.     [provider]  tiZANidine (ZANAFLEX) 4 MG tablet Take 1 tablet (4 mg total) by mouth every 6 (six) hours as needed for  muscle spasms. 08/16/17   , PA-C    Allergies    Haloperidol decanoate, Ibuprofen, Tramadol, Flexeril [cyclobenzaprine], and Naproxen  Review of Systems   Review of Systems  Constitutional: Negative for chills, diaphoresis and fever.  HENT: Positive for congestion, dental problem, ear pain, rhinorrhea, sinus pressure and sneezing. Negative for ear discharge, facial swelling, sore throat, trouble swallowing and voice change.   Eyes:       Watery eyes  Respiratory: Negative for cough and shortness of breath.   Cardiovascular: Negative for chest pain.  Gastrointestinal: Negative for abdominal pain, diarrhea,  nausea and vomiting.  Musculoskeletal: Positive for back pain.  Skin: Negative for rash.  Neurological: Negative for dizziness, syncope, weakness and numbness.  All other systems reviewed and are negative.   Physical Exam Updated Vital Signs BP 131/61 (BP Location: Right Arm)   Pulse 61   Temp 98.4 F (36.9 C) (Oral)   Resp 12   LMP 09/06/2016   SpO2 100%   Physical Exam Vitals and nursing note reviewed.  Constitutional:      General: She is not in acute distress.    Appearance: She is well-developed. She is not diaphoretic.  HENT:     Head: Normocephalic and atraumatic.     Right Ear: Tympanic membrane, ear canal and external ear normal.     Left Ear: Tympanic membrane, ear canal and external ear normal.     Nose: Congestion and rhinorrhea present.     Mouth/Throat:     Mouth: Mucous membranes are moist.     Comments: Some tenderness to the left upper teeth.  No noted area of intraoral swelling or fluctuance.  No trismus or noted abnormal phonation.  Mouth opening to at least 3 finger widths.  Handles oral secretions without difficulty.  No noted facial swelling.  No sublingual swelling or tongue elevation.  No swelling or tenderness to the submental or submandibular regions.  No swelling or tenderness into the soft tissues of the neck. Eyes:      Conjunctiva/sclera: Conjunctivae normal.  Cardiovascular:     Rate and Rhythm: Normal rate and regular rhythm.     Pulses: Normal pulses.          Radial pulses are 2+ on the right side and 2+ on the left side.       Posterior tibial pulses are 2+ on the right side and 2+ on the left side.     Heart sounds: Normal heart sounds.     Comments: Tactile temperature in the extremities appropriate and equal bilaterally. Pulmonary:     Effort: Pulmonary effort is normal. No respiratory distress.     Breath sounds: Normal breath sounds.  Abdominal:     Palpations: Abdomen is soft.     Tenderness: There is no abdominal tenderness. There is no guarding.  Musculoskeletal:     Cervical back: Neck supple.     Right lower leg: No edema.     Left lower leg: No edema.  Lymphadenopathy:     Cervical: No cervical adenopathy.  Skin:    General: Skin is warm and dry.  Neurological:     Mental Status: She is alert.     Comments: Sensation grossly intact to light touch in the lower extremities bilaterally. No saddle anesthesias. Strength 5/5 in the bilateral lower extremities. Independently ambulatory. Coordination intact.  Psychiatric:        Mood and Affect: Mood and affect normal.        Speech: Speech normal.        Behavior: Behavior normal.     ED Results / Procedures / Treatments   Labs (all labs ordered are listed, but only abnormal results are displayed) Labs Reviewed - No data to display  EKG None  Radiology No results found.  Procedures Procedures (including critical care time)  Medications Ordered in ED Medications - No data to display  ED Course  I have reviewed the triage vital signs and the nursing notes.  Pertinent labs & imaging results that were available during my care of the patient were reviewed by  me and considered in my medical decision making (see chart for details).    MDM Rules/Calculators/A&P                          Patient presents with multiple  complaints. I reviewed her previous available notes.  It appears as though patient has history of lumbar radiculopathy.  I suspect she is having a flare of this with her back pain.  No focal neurologic deficits.  No findings to suggest neurosurgical emergency.  I think her symptoms of watery eyes, rhinorrhea, ear pressure are likely, as the patient suspects, associated with environmental/seasonal allergies.  Patient given referral for dentist for her dental pain.    The patient was given instructions for home care as well as return precautions. Patient voices understanding of these instructions, accepts the plan, and is comfortable with discharge.   Final Clinical Impression(s) / ED Diagnoses Final diagnoses:  Chronic bilateral low back pain with left-sided sciatica  Rhinorrhea  Watery eyes    Rx / DC Orders ED Discharge Orders         Ordered    predniSONE (STERAPRED UNI-PAK 21 TAB) 10 MG (21) TBPK tablet        01/10/20 1627    methocarbamol (ROBAXIN) 500 MG tablet  2 times daily        01/10/20 1627    fluticasone (FLONASE) 50 MCG/ACT nasal spray  Daily        01/10/20 1627    lidocaine (LIDODERM) 5 %  Every 24 hours        01/10/20 1628    lidocaine (XYLOCAINE) 2 % solution  As needed        01/10/20 1637           Anselm Pancoast, PA-C 01/10/20 1703    Eber Hong, MD 01/10/20 1954

## 2020-01-10 NOTE — Discharge Instructions (Addendum)
Take it easy, but do not lay around too much as this may make any stiffness worse.  Antiinflammatory medications: Take 600 mg of ibuprofen every 6 hours or 440 mg (over the counter dose) to 500 mg (prescription dose) of naproxen every 12 hours for the next 3 days. After this time, these medications may be used as needed for pain. Take these medications with food to avoid upset stomach. Choose only one of these medications, do not take them together. Acetaminophen (generic for Tylenol): Should you continue to have additional pain while taking the ibuprofen or naproxen, you may add in acetaminophen as needed. Your daily total maximum amount of acetaminophen from all sources should be limited to 4000mg /day for persons without liver problems, or 2000mg /day for those with liver problems. Methocarbamol: Methocarbamol (generic for Robaxin) is a muscle relaxer and can help relieve stiff muscles or muscle spasms.  Do not drive or perform other dangerous activities while taking this medication as it can cause drowsiness as well as changes in reaction time and judgement. Lidocaine patches: These are available via either prescription or over-the-counter. The over-the-counter option may be more economical one and are likely just as effective. There are multiple over-the-counter brands, such as Salonpas. Prednisone: Take the prednisone, as prescribed, until finished. If you are a diabetic, please know prednisone can raise your blood sugar temporarily. Ice: May apply ice to the area over the next 24 hours for 15 minutes at a time to reduce pain, inflammation, and swelling, if present. Exercises: Be sure to perform the attached exercises starting with three times a week and working up to performing them daily. This is an essential part of preventing long term problems.  Follow up: Follow up with your orthopedic specialist for any future management of these complaints. Be sure to follow up within 7-10 days. Return: Return  to the ED should symptoms worsen.  For prescription assistance, may try using prescription discount sites or apps, such as goodrx.com  Allergy symptoms  Hand washing: Wash your hands throughout the day, but especially before and after touching the face, using the restroom, sneezing, coughing, or touching surfaces that have been coughed or sneezed upon. Hydration: Symptoms of most illnesses will be intensified and complicated by dehydration. Dehydration can also extend the duration of symptoms. Drink plenty of fluids and get plenty of rest. You should be drinking at least half a liter of water an hour to stay hydrated. Electrolyte drinks (ex. Gatorade, Powerade, Pedialyte) are also encouraged. You should be drinking enough fluids to make your urine light yellow, almost clear. If this is not the case, you are not drinking enough water. Please note that some of the treatments indicated below will not be effective if you are not adequately hydrated. Pain or fever: Ibuprofen, Naproxen, or acetaminophen (generic for Tylenol) for pain or fever.  Zyrtec or Claritin: May add these medication daily to control underlying symptoms of congestion, sneezing, and other signs of allergies.  These medications are available over-the-counter. Generics: Cetirizine (generic for Zyrtec) and loratadine (generic for Claritin). Fluticasone: Use fluticasone (generic for Flonase), as directed, for nasal and sinus congestion.  This medication is available over-the-counter. Congestion: Plain guaifenesin (generic for plain Mucinex) may help relieve congestion. Saline sinus rinses and saline nasal sprays may also help relieve congestion. If you do not have high blood pressure, heart problems, or an allergy to such medications, you may also try phenylephrine or Sudafed. Sore throat: Warm liquids or Chloraseptic spray may help soothe a sore throat.  Gargle twice a day with a salt water solution made from a half teaspoon of salt in a cup  of warm water.  Follow up: Follow up with a primary care provider within the next two weeks should symptoms fail to resolve. Return: Return to the ED for significantly worsening symptoms, shortness of breath, persistent vomiting, large amounts of blood in stool, or any other major concerns.  For prescription assistance, may try using prescription discount sites or apps, such as goodrx.com   Dental Pain You have been seen today for dental pain. You should follow up with a dentist as soon as possible. This problem will not resolve on its own without the care of a dentist.  Lidocaine liquid: Use the viscous lidocaine for mouth pain. Swish with the lidocaine and spit it out. Do not swallow it. Salt water solution: You should also swish with a homemade salt water solution, twice a day.  Make this solution by mixing 8 ounces of warm water with about half a teaspoon of salt. Antiinflammatory medications: Take 600 mg of ibuprofen every 6 hours or 440 mg (over the counter dose) to 500 mg (prescription dose) of naproxen every 12 hours for the next 3 days. After this time, these medications may be used as needed for pain. Take these medications with food to avoid upset stomach. Choose only one of these medications, do not take them together. Acetaminophen (generic for Tylenol): Should you continue to have additional pain while taking the ibuprofen or naproxen, you may add in acetaminophen as needed. Your daily total maximum amount of acetaminophen from all sources should be limited to 4000mg /day for persons without liver problems, or 2000mg /day for those with liver problems.  For prescription assistance, may try using prescription discount sites or apps, such as goodrx.com

## 2020-01-15 ENCOUNTER — Ambulatory Visit: Payer: Medicaid Other | Admitting: Specialist

## 2020-02-23 ENCOUNTER — Encounter: Payer: Self-pay | Admitting: Physician Assistant

## 2020-02-23 ENCOUNTER — Telehealth: Payer: Self-pay | Admitting: *Deleted

## 2020-02-23 ENCOUNTER — Ambulatory Visit (INDEPENDENT_AMBULATORY_CARE_PROVIDER_SITE_OTHER): Payer: Medicaid Other | Admitting: Physician Assistant

## 2020-02-23 DIAGNOSIS — M65342 Trigger finger, left ring finger: Secondary | ICD-10-CM

## 2020-02-23 DIAGNOSIS — M79605 Pain in left leg: Secondary | ICD-10-CM

## 2020-02-23 DIAGNOSIS — M65341 Trigger finger, right ring finger: Secondary | ICD-10-CM | POA: Diagnosis not present

## 2020-02-23 MED ORDER — METHYLPREDNISOLONE ACETATE 40 MG/ML IJ SUSP
20.0000 mg | INTRAMUSCULAR | Status: AC | PRN
  Administered 2020-02-23: 20 mg

## 2020-02-23 MED ORDER — LIDOCAINE HCL 1 % IJ SOLN
0.5000 mL | INTRAMUSCULAR | Status: AC | PRN
  Administered 2020-02-23: .5 mL

## 2020-02-23 NOTE — Telephone Encounter (Signed)
Pt scheduled tomorrow at [facility] at 900am. Pt is aware fo appt

## 2020-02-23 NOTE — Progress Notes (Signed)
   Procedure Note  Patient: Barbara Alvarez             Date of Birth: Jul 01, 1965           MRN: [id number]             Visit Date: 02/23/2020  Mrs. [name] is well-known to our department service comes in today with bilateral ring fingers that are locking up.  She said no known injury.  She is nondiabetic.  She denies any shortness of breath fevers chest pain.  She states that the leg left hand is painful to make a fist. Patient also notes that she is having left leg swelling and foot swelling states she is discussed this in the past however I find no mention of it in previous notes from all providers in this office.  States that her calf pain is becoming worse on the left.  She notes the pain in the calf is been ongoing for years.  She does see Dr. Otelia Sergeant for her back and it has been discussed for her to have 1 level lumbar fusion at L5-S1.  Patient is a smoker.  Review of systems see HPI otherwise negative  Physical exam: General well-developed well-nourished female no acute distress mood and affect appropriate.  Psych alert and orient x3. Vascular: Bilateral calves are supple nontender she does have slight edema of the left leg compared to the right.  Dorsal pedal pulses are 2+ bilaterally equal symmetric.  Bilateral hands she has triggering actively of the right ring finger with tenderness at the A1 pulley where there is palpable nodule.  Left finger is stiff but no active triggering there is a palpable nodule at the A1 pulley of the left ring finger.  No rashes skin lesions ulcerations bilateral hands otherwise. Procedures: Visit Diagnoses:  1. Pain in left leg   2. Trigger finger, left ring finger   3. Trigger finger, right ring finger     Hand/UE Inj: bilateral ring A1 for trigger finger on 02/23/2020 11:20 AM Details: 25 G needle, volar approach Medications (Right): 0.5 mL lidocaine 1 %; 20 mg methylPREDNISolone acetate 40 MG/ML Medications (Left): 0.5 mL lidocaine 1 %; 20 mg  methylPREDNISolone acetate 40 MG/ML Consent was given by the patient. Immediately prior to procedure a time out was called to verify the correct patient, procedure, equipment, support staff and site/side marked as required. Patient was prepped and draped in the usual sterile fashion.    Plan: We will order an ultrasound of her left leg to rule out DVT due to the swelling.  In regards to her fingers explained to her that we will see how she does with the injections and just have her follow-up with [place] as needed.  Questions were encouraged and answered at length.

## 2020-02-24 ENCOUNTER — Telehealth: Payer: Self-pay | Admitting: Physician Assistant

## 2020-02-24 ENCOUNTER — Other Ambulatory Visit: Payer: Self-pay | Admitting: Orthopaedic Surgery

## 2020-02-24 ENCOUNTER — Ambulatory Visit (HOSPITAL_COMMUNITY): Admission: RE | Admit: 2020-02-24 | Payer: Medicaid Other | Source: Ambulatory Visit

## 2020-02-24 MED ORDER — ACETAMINOPHEN-CODEINE #3 300-30 MG PO TABS
1.0000 | ORAL_TABLET | Freq: Three times a day (TID) | ORAL | 0 refills | Status: DC | PRN
Start: 2020-02-24 — End: 2020-03-18

## 2020-02-24 NOTE — Telephone Encounter (Signed)
Please advise. Thanks.  

## 2020-02-24 NOTE — Telephone Encounter (Signed)
I saw that Bronson Curb did these injections yesterday for triggering.  Tell her that can be normal.  She can certainly soak her hands for 15 to 20 minutes in slightly warm water with Epson salts and that may help her hands loosen up.  She can do this for 15 to 20 minutes twice daily.  Also send in some Tylenol 3 to try.

## 2020-02-24 NOTE — Telephone Encounter (Signed)
Tried calling patient to discuss. No answer.  

## 2020-02-24 NOTE — Telephone Encounter (Signed)
Patient called advised she can not bend her fingers and there is a knot below both fingers where she had the injections yesterday. Patient said she could not sleep last night due to the pain she was in. The number to contact patient is [phone]

## 2020-03-18 ENCOUNTER — Other Ambulatory Visit: Payer: Self-pay

## 2020-03-18 ENCOUNTER — Encounter: Payer: Self-pay | Admitting: Specialist

## 2020-03-18 ENCOUNTER — Ambulatory Visit (INDEPENDENT_AMBULATORY_CARE_PROVIDER_SITE_OTHER): Payer: Medicaid Other | Admitting: Specialist

## 2020-03-18 ENCOUNTER — Ambulatory Visit (INDEPENDENT_AMBULATORY_CARE_PROVIDER_SITE_OTHER): Payer: Medicaid Other

## 2020-03-18 VITALS — BP 126/80 | HR 85 | Ht 66.0 in | Wt 167.8 lb

## 2020-03-18 DIAGNOSIS — M5116 Intervertebral disc disorders with radiculopathy, lumbar region: Secondary | ICD-10-CM

## 2020-03-18 DIAGNOSIS — M65341 Trigger finger, right ring finger: Secondary | ICD-10-CM

## 2020-03-18 DIAGNOSIS — M4807 Spinal stenosis, lumbosacral region: Secondary | ICD-10-CM | POA: Diagnosis not present

## 2020-03-18 DIAGNOSIS — M5137 Other intervertebral disc degeneration, lumbosacral region: Secondary | ICD-10-CM | POA: Diagnosis not present

## 2020-03-18 DIAGNOSIS — M65342 Trigger finger, left ring finger: Secondary | ICD-10-CM

## 2020-03-18 MED ORDER — DIAZEPAM 5 MG PO TABS
ORAL_TABLET | ORAL | 0 refills | Status: AC
Start: 2020-03-18 — End: ?

## 2020-03-18 MED ORDER — ACETAMINOPHEN-CODEINE #3 300-30 MG PO TABS: 1.0000 | ORAL_TABLET | Freq: Three times a day (TID) | ORAL | 0 refills | Status: DC | PRN

## 2020-03-18 NOTE — Progress Notes (Signed)
Office Visit Note   Patient: Barbara Alvarez           Date of Birth: October 15, 1965           MRN: [id number] Visit Date: 03/18/2020              Requested by: Leilani Able, MD [address] [place],  [place] [address] PCP: Leilani Able, MD   Assessment & Plan: Visit Diagnoses:  1. Lumbar disc herniation with radiculopathy   2. Disc disease, degenerative, lumbar or lumbosacral   3. Spinal stenosis of lumbosacral region   4. Trigger finger, left ring finger   5. Trigger finger, right ring finger      Plan:Avoid bending, stooping and avoid lifting weights greater than 10 lbs. Avoid prolong standing and walking. Order for a new walker with wheels. Surgery will likely help the back and leg pain and may reduce it by as much as 70 %.   Surgery recommended is aonelevel lumbar fusion L5-S1 this would be done with rods, screws and cages with local bone graft and allograft (donor bone graft). Take hydrocodone for for pain. Risk of surgery includes risk of infection 1 in 200 patients, bleeding 1/2% chance you would need a transfusion. Risk to the nerves is one in 10,000. You will need to use a brace for 3 months and wean from the brace on the 4th month. Expect improved walking and standing tolerance. Expect relief of leg pain but numbness may persist depending on the length and degree of pressure that has been present. Schedule you an appt with occupational therapy to treat the trigger fingers with exercises and iontophoresis. MRI is over one year old and needs to be repeated prior to sugery  Follow-Up Instructions: Return in about 4 weeks (around 04/15/2020).   Orders:  Orders Placed This Encounter  Procedures  . XR Lumbar Spine 2-3 Views  . MR Lumbar Spine w/o contrast  . Ambulatory referral to Occupational Therapy   Meds ordered this encounter  Medications  . diazepam (VALIUM) 5 MG tablet    Sig: Take 5 mg at MRI scanner and may repeat one time after 30 minutes.     Dispense:  2 tablet    Refill:  0      Procedures: No procedures performed   Clinical Data: No additional findings.   Subjective: Chief Complaint  Patient presents with  . Lower Back - Follow-up, Pain    54 year old female with history of back pain and pain all up and down the right side. She reports she is working with [employer] 8 hours day for 3 days a week or  24.5 hours per week. Working TH W and Fri. She has pain at the end of the day and also recently a family member left shoes in front of bathroom and she tripped twisting her  Back again. She got up at 5 AM this AM with pain no matter how she turns. She has constipation, no incontinence. Pain with standing and walking. Sitting with pain and she tends  To lean to the opposite side. Also pain at about the level of the brassiere strap. Has bilateral hand catching of the ring fingers with flexion and extension of the digits. She was Delane Ginger recently one week ago and injected the bilateral  triger fice.    Review of Systems  Constitutional: Negative.   HENT: Negative.   Eyes: Negative.   Respiratory: Negative.   Cardiovascular: Negative.   Gastrointestinal:  Negative.   Endocrine: Negative.   Genitourinary: Negative.   Musculoskeletal: Negative.   Skin: Negative.   Allergic/Immunologic: Negative.   Neurological: Negative.   Hematological: Negative.   Psychiatric/Behavioral: Negative.      Objective: Vital Signs: BP 126/80 (BP Location: Left Arm, Patient Position: Sitting)   Pulse 85   Ht 5\' 6"  (1.676 m)   Wt 167 lb 12.8 oz (76.1 kg)   LMP 09/06/2016   BMI 27.08 kg/m   Physical Exam Constitutional:      Appearance: She is well-developed.  HENT:     Head: Normocephalic and atraumatic.  Eyes:     Pupils: Pupils are equal, round, and reactive to light.  Pulmonary:     Effort: Pulmonary effort is normal.     Breath sounds: Normal breath sounds.  Abdominal:     General: Bowel sounds are normal.      Palpations: Abdomen is soft.  Musculoskeletal:     Cervical back: Normal range of motion and neck supple.     Lumbar back: Negative right straight leg raise test and negative left straight leg raise test.  Skin:    General: Skin is warm and dry.  Neurological:     Mental Status: She is alert and oriented to person, place, and time.  Psychiatric:        Behavior: Behavior normal.        Thought Content: Thought content normal.        Judgment: Judgment normal.     Back Exam   Tenderness  The patient is experiencing tenderness in the lumbar.  Range of Motion  Extension: abnormal  Flexion:  80 abnormal  Lateral bend right: abnormal  Lateral bend left: abnormal  Rotation right: abnormal  Rotation left: abnormal   Muscle Strength  Right Quadriceps:  5/5  Left Quadriceps:  5/5  Right Hamstrings:  5/5  Left Hamstrings:  5/5   Tests  Straight leg raise right: negative Straight leg raise left: negative  Reflexes  Patellar: 0/4 Achilles: 0/4  Other  Toe walk: abnormal Heel walk: abnormal Sensation: normal Gait: abductor lurch    Right Hand Exam   Tenderness  The patient is experiencing tenderness in the palmar area.  Other  Erythema: absent Scars: absent  Comments:  Triggering of the ring finger right and left hands.   Left Hand Exam   Tenderness  The patient is experiencing tenderness in the palmar area.   Other  Erythema: absent Scars: absent      Specialty Comments:  No specialty comments available.  Imaging: XR Lumbar Spine 2-3 Views  Result Date: 03/18/2020 AP and lateral lumbar spine with degenerative disc narrowing L5-S1 transition level S1-S2. There is no listhesis at this segment. MRI from 02/2020 shows DDD L5-S1 with modic changes of L5 and S1.    PMFS History: Patient Active Problem List   Diagnosis Date Noted  . Numbness and tingling of leg   . Pain in both lower legs 03/04/2019  . Cervical spine pain 01/21/2019  . Pain in  right finger(s) 11/22/2016  . Finger pain, left 11/22/2016  . Chronic right shoulder pain 11/22/2016  . Essential hypertension 05/30/2016  . Right shoulder injury, initial encounter 05/30/2016  . Seasonal allergic rhinitis 05/30/2016  . Muscle spasm of right shoulder 05/30/2016  . Need for immunization against influenza 05/30/2016  . Bipolar disorder (HCC) 05/30/2016  . Acquired mallet finger of right hand 06/15/2015   Past Medical History:  Diagnosis Date  . Bipolar  1 disorder (HCC)   . Bronchitis   . Chronic back pain   . Chronic dental pain   . Chronic neck pain   . Chronic pain in left foot    since great toe fx  . Claustrophobia   . Headache   . Hearing loss in right ear   . Hypertension   . Shoulder pain     Family History  Problem Relation Age of Onset  . Cancer Mother   . Hypertension Father   . Diabetes Other     Past Surgical History:  Procedure Laterality Date  . CHOLECYSTECTOMY    . LIGAMENT REPAIR     left arm   Social History   Occupational History  . Not on file  Tobacco Use  . Smoking status: Current Every Day Smoker    Packs/day: 0.25    Types: Cigarettes  . Smokeless tobacco: Never Used  Vaping Use  . Vaping Use: Never used  Substance and Sexual Activity  . Alcohol use: Yes    Comment: hasn't had drink in about 1 wk  . Drug use: No  . Sexual activity: Not on file

## 2020-03-18 NOTE — Patient Instructions (Signed)
Plan:Avoid bending, stooping and avoid lifting weights greater than 10 lbs. Avoid prolong standing and walking. Order for a new walker with wheels. Surgery will likely help the back and leg pain and may reduce it by as much as 70 %.   Surgery recommended is aonelevel lumbar fusion L5-S1 this would be done with rods, screws and cages with local bone graft and allograft (donor bone graft). Take hydrocodone for for pain. Risk of surgery includes risk of infection 1 in 200 patients, bleeding 1/2% chance you would need a transfusion. Risk to the nerves is one in 10,000. You will need to use a brace for 3 months and wean from the brace on the 4th month. Expect improved walking and standing tolerance. Expect relief of leg pain but numbness may persist depending on the length and degree of pressure that has been present. Schedule you an appt with occupational therapy to treat the trigger fingers with exercises and iontophoresis. MRI is over one year old and needs to be repeated prior to sugery

## 2020-03-18 NOTE — Addendum Note (Signed)
Addended by: Vira Browns on: 03/18/2020 11:16 AM   Modules accepted: Orders

## 2020-03-22 ENCOUNTER — Other Ambulatory Visit: Payer: Self-pay | Admitting: Orthopaedic Surgery

## 2020-03-22 DIAGNOSIS — M545 Low back pain, unspecified: Secondary | ICD-10-CM

## 2020-04-19 ENCOUNTER — Ambulatory Visit: Payer: Medicaid Other | Admitting: Specialist

## 2020-04-23 ENCOUNTER — Other Ambulatory Visit: Payer: Self-pay

## 2020-04-23 ENCOUNTER — Inpatient Hospital Stay (HOSPITAL_COMMUNITY)
Admission: EM | Admit: 2020-04-23 | Discharge: 2020-04-25 | DRG: 177 | Disposition: A | Discharge status: Home / Self Care | Payer: Medicaid Other | Attending: Internal Medicine | Admitting: Internal Medicine

## 2020-04-23 ENCOUNTER — Encounter (HOSPITAL_COMMUNITY): Payer: Self-pay

## 2020-04-23 ENCOUNTER — Emergency Department (HOSPITAL_COMMUNITY): Payer: Medicaid Other

## 2020-04-23 DIAGNOSIS — J1282 Pneumonia due to coronavirus disease 2019: Secondary | ICD-10-CM | POA: Diagnosis present

## 2020-04-23 DIAGNOSIS — E876 Hypokalemia: Secondary | ICD-10-CM | POA: Diagnosis present

## 2020-04-23 DIAGNOSIS — F3163 Bipolar disorder, current episode mixed, severe, without psychotic features: Secondary | ICD-10-CM | POA: Diagnosis not present

## 2020-04-23 DIAGNOSIS — F40240 Claustrophobia: Secondary | ICD-10-CM | POA: Diagnosis present

## 2020-04-23 DIAGNOSIS — I10 Essential (primary) hypertension: Secondary | ICD-10-CM | POA: Diagnosis present

## 2020-04-23 DIAGNOSIS — F319 Bipolar disorder, unspecified: Secondary | ICD-10-CM | POA: Diagnosis present

## 2020-04-23 DIAGNOSIS — H9191 Unspecified hearing loss, right ear: Secondary | ICD-10-CM | POA: Diagnosis present

## 2020-04-23 DIAGNOSIS — Z79899 Other long term (current) drug therapy: Secondary | ICD-10-CM

## 2020-04-23 DIAGNOSIS — Z888 Allergy status to other drugs, medicaments and biological substances status: Secondary | ICD-10-CM | POA: Diagnosis not present

## 2020-04-23 DIAGNOSIS — U071 COVID-19: Principal | ICD-10-CM | POA: Diagnosis present

## 2020-04-23 DIAGNOSIS — Z8249 Family history of ischemic heart disease and other diseases of the circulatory system: Secondary | ICD-10-CM | POA: Diagnosis not present

## 2020-04-23 DIAGNOSIS — F17210 Nicotine dependence, cigarettes, uncomplicated: Secondary | ICD-10-CM | POA: Diagnosis present

## 2020-04-23 DIAGNOSIS — J302 Other seasonal allergic rhinitis: Secondary | ICD-10-CM | POA: Diagnosis present

## 2020-04-23 DIAGNOSIS — Z833 Family history of diabetes mellitus: Secondary | ICD-10-CM | POA: Diagnosis not present

## 2020-04-23 DIAGNOSIS — J440 Chronic obstructive pulmonary disease with acute lower respiratory infection: Secondary | ICD-10-CM | POA: Diagnosis present

## 2020-04-23 DIAGNOSIS — E785 Hyperlipidemia, unspecified: Secondary | ICD-10-CM | POA: Diagnosis present

## 2020-04-23 LAB — CBC WITH DIFFERENTIAL/PLATELET
Abs Immature Granulocytes: 0.03 10*3/uL (ref 0.00–0.07)
Basophils Absolute: 0 10*3/uL (ref 0.0–0.1)
Basophils Relative: 0 %
Eosinophils Absolute: 0 10*3/uL (ref 0.0–0.5)
Eosinophils Relative: 0 %
HCT: 35.2 % — ABNORMAL LOW (ref 36.0–46.0)
Hemoglobin: 11.9 g/dL — ABNORMAL LOW (ref 12.0–15.0)
Immature Granulocytes: 1 %
Lymphocytes Relative: 33 %
Lymphs Abs: 1.2 10*3/uL (ref 0.7–4.0)
MCH: 29 pg (ref 26.0–34.0)
MCHC: 33.8 g/dL (ref 30.0–36.0)
MCV: 85.9 fL (ref 80.0–100.0)
Monocytes Absolute: 0.4 10*3/uL (ref 0.1–1.0)
Monocytes Relative: 12 %
Neutro Abs: 2 10*3/uL (ref 1.7–7.7)
Neutrophils Relative %: 54 %
Platelets: 234 10*3/uL (ref 150–400)
RBC: 4.1 MIL/uL (ref 3.87–5.11)
RDW: 13.7 % (ref 11.5–15.5)
WBC: 3.6 10*3/uL — ABNORMAL LOW (ref 4.0–10.5)
nRBC: 0 % (ref 0.0–0.2)

## 2020-04-23 LAB — COMPREHENSIVE METABOLIC PANEL
ALT: 39 U/L (ref 0–44)
AST: 46 U/L — ABNORMAL HIGH (ref 15–41)
Albumin: 3.6 g/dL (ref 3.5–5.0)
Alkaline Phosphatase: 52 U/L (ref 38–126)
Anion gap: 13 (ref 5–15)
BUN: 10 mg/dL (ref 6–20)
CO2: 21 mmol/L — ABNORMAL LOW (ref 22–32)
Calcium: 8.1 mg/dL — ABNORMAL LOW (ref 8.9–10.3)
Chloride: 103 mmol/L (ref 98–111)
Creatinine, Ser: 0.89 mg/dL (ref 0.44–1.00)
GFR, Estimated: >60 mL/min (ref >60)
Glucose, Bld: 93 mg/dL (ref 70–99)
Potassium: 2.9 mmol/L — ABNORMAL LOW (ref 3.5–5.1)
Sodium: 137 mmol/L (ref 135–145)
Total Bilirubin: 1.2 mg/dL (ref 0.3–1.2)
Total Protein: 7.4 g/dL (ref 6.5–8.1)

## 2020-04-23 LAB — RESP PANEL BY RT-PCR (FLU A&B, COVID) ARPGX2
Influenza A by PCR: NEGATIVE
Influenza B by PCR: NEGATIVE
SARS Coronavirus 2 by RT PCR: POSITIVE — AB

## 2020-04-23 LAB — LACTIC ACID, PLASMA: Lactic Acid, Venous: 0.8 mmol/L (ref 0.5–1.9)

## 2020-04-23 LAB — PROCALCITONIN: Procalcitonin: <0.1 ng/mL

## 2020-04-23 LAB — FIBRINOGEN: Fibrinogen: 764 mg/dL — ABNORMAL HIGH (ref 210–475)

## 2020-04-23 LAB — TROPONIN I (HIGH SENSITIVITY)
Troponin I (High Sensitivity): 20 ng/L — ABNORMAL HIGH (ref <18)
Troponin I (High Sensitivity): 17 ng/L (ref <18)

## 2020-04-23 LAB — D-DIMER, QUANTITATIVE: D-Dimer, Quant: 0.83 ug/mL-FEU — ABNORMAL HIGH (ref 0.00–0.50)

## 2020-04-23 LAB — TRIGLYCERIDES: Triglycerides: 80 mg/dL (ref <150)

## 2020-04-23 LAB — LACTATE DEHYDROGENASE: LDH: 222 U/L — ABNORMAL HIGH (ref 98–192)

## 2020-04-23 LAB — FERRITIN: Ferritin: 696 ng/mL — ABNORMAL HIGH (ref 11–307)

## 2020-04-23 LAB — C-REACTIVE PROTEIN: CRP: 10.8 mg/dL — ABNORMAL HIGH (ref <1.0)

## 2020-04-23 MED ORDER — POTASSIUM CHLORIDE CRYS ER 20 MEQ PO TBCR
40.0000 meq | EXTENDED_RELEASE_TABLET | Freq: Once | ORAL | Status: AC
  Administered 2020-04-23: 14:00:00 40 meq via ORAL
  Filled 2020-04-23: qty 2

## 2020-04-23 MED ORDER — SODIUM CHLORIDE 0.9% FLUSH
3.0000 mL | Freq: Two times a day (BID) | INTRAVENOUS | Status: DC
  Administered 2020-04-23 – 2020-04-25 (×4): 3 mL via INTRAVENOUS

## 2020-04-23 MED ORDER — ZOLPIDEM TARTRATE 5 MG PO TABS
5.0000 mg | ORAL_TABLET | Freq: Every evening | ORAL | Status: DC | PRN
  Administered 2020-04-24: 23:00:00 5 mg via ORAL
  Filled 2020-04-23: qty 1

## 2020-04-23 MED ORDER — SODIUM CHLORIDE 0.9 % IV BOLUS
500.0000 mL | Freq: Once | INTRAVENOUS | Status: AC
  Administered 2020-04-23: 14:00:00 500 mL via INTRAVENOUS

## 2020-04-23 MED ORDER — ALBUTEROL SULFATE HFA 108 (90 BASE) MCG/ACT IN AERS
4.0000 | INHALATION_SPRAY | Freq: Once | RESPIRATORY_TRACT | Status: AC
  Administered 2020-04-23: 12:00:00 4 via RESPIRATORY_TRACT
  Filled 2020-04-23: qty 6.7

## 2020-04-23 MED ORDER — RISPERIDONE 0.25 MG PO TABS
0.5000 mg | ORAL_TABLET | Freq: Two times a day (BID) | ORAL | Status: DC
  Administered 2020-04-23 – 2020-04-25 (×4): 0.5 mg via ORAL
  Filled 2020-04-23 (×4): qty 2

## 2020-04-23 MED ORDER — SODIUM CHLORIDE 0.9% FLUSH
3.0000 mL | INTRAVENOUS | Status: DC | PRN
  Administered 2020-04-23: 22:00:00 3 mL via INTRAVENOUS

## 2020-04-23 MED ORDER — SODIUM CHLORIDE 0.9% FLUSH
3.0000 mL | Freq: Two times a day (BID) | INTRAVENOUS | Status: DC
  Administered 2020-04-23 – 2020-04-25 (×4): 3 mL via INTRAVENOUS

## 2020-04-23 MED ORDER — METHYLPREDNISOLONE SODIUM SUCC 125 MG IJ SOLR
125.0000 mg | Freq: Once | INTRAMUSCULAR | Status: AC
  Administered 2020-04-23: 15:00:00 125 mg via INTRAVENOUS
  Filled 2020-04-23: qty 2

## 2020-04-23 MED ORDER — ONDANSETRON HCL 4 MG PO TABS: 4.0000 mg | ORAL_TABLET | Freq: Four times a day (QID) | ORAL | Status: DC | PRN

## 2020-04-23 MED ORDER — ACETAMINOPHEN 325 MG PO TABS
650.0000 mg | ORAL_TABLET | Freq: Four times a day (QID) | ORAL | Status: DC | PRN
  Administered 2020-04-23 – 2020-04-24 (×2): 650 mg via ORAL
  Filled 2020-04-23 (×2): qty 2

## 2020-04-23 MED ORDER — ACETAMINOPHEN 500 MG PO TABS
1000.0000 mg | ORAL_TABLET | Freq: Once | ORAL | Status: AC
  Administered 2020-04-23: 12:00:00 1000 mg via ORAL
  Filled 2020-04-23: qty 2

## 2020-04-23 MED ORDER — GUAIFENESIN-DM 100-10 MG/5ML PO SYRP
10.0000 mL | ORAL_SOLUTION | ORAL | Status: DC | PRN
  Administered 2020-04-24: 23:00:00 10 mL via ORAL
  Filled 2020-04-23: qty 10

## 2020-04-23 MED ORDER — TIZANIDINE HCL 4 MG PO TABS: 4.0000 mg | ORAL_TABLET | Freq: Four times a day (QID) | ORAL | Status: DC | PRN

## 2020-04-23 MED ORDER — ENOXAPARIN SODIUM 40 MG/0.4ML ~~LOC~~ SOLN
40.0000 mg | SUBCUTANEOUS | Status: DC
  Administered 2020-04-23 – 2020-04-24 (×2): 40 mg via SUBCUTANEOUS
  Filled 2020-04-23 (×2): qty 0.4

## 2020-04-23 MED ORDER — HYDROCOD POLST-CPM POLST ER 10-8 MG/5ML PO SUER: 5.0000 mL | Freq: Two times a day (BID) | ORAL | Status: DC | PRN

## 2020-04-23 MED ORDER — ZINC SULFATE 220 (50 ZN) MG PO CAPS
220.0000 mg | ORAL_CAPSULE | Freq: Every day | ORAL | Status: DC
  Administered 2020-04-24 – 2020-04-25 (×2): 220 mg via ORAL
  Filled 2020-04-23 (×2): qty 1

## 2020-04-23 MED ORDER — ALBUTEROL SULFATE HFA 108 (90 BASE) MCG/ACT IN AERS
2.0000 | INHALATION_SPRAY | Freq: Four times a day (QID) | RESPIRATORY_TRACT | Status: DC
  Administered 2020-04-23 – 2020-04-25 (×7): 2 via RESPIRATORY_TRACT
  Filled 2020-04-23 (×2): qty 6.7

## 2020-04-23 MED ORDER — HYDROCODONE-ACETAMINOPHEN 5-325 MG PO TABS
1.0000 | ORAL_TABLET | ORAL | Status: DC | PRN
  Administered 2020-04-25: 2 via ORAL
  Administered 2020-04-25: 1 via ORAL
  Filled 2020-04-23: qty 1
  Filled 2020-04-23: qty 2

## 2020-04-23 MED ORDER — ASCORBIC ACID 500 MG PO TABS
500.0000 mg | ORAL_TABLET | Freq: Every day | ORAL | Status: DC
  Administered 2020-04-24 – 2020-04-25 (×2): 500 mg via ORAL
  Filled 2020-04-23 (×2): qty 1

## 2020-04-23 MED ORDER — PREDNISONE 20 MG PO TABS: 50.0000 mg | ORAL_TABLET | Freq: Every day | ORAL | Status: DC

## 2020-04-23 MED ORDER — POLYETHYLENE GLYCOL 3350 17 G PO PACK: 17.0000 g | PACK | Freq: Every day | ORAL | Status: DC | PRN

## 2020-04-23 MED ORDER — SODIUM CHLORIDE 0.9 % IV SOLN: 250.0000 mL | INTRAVENOUS | Status: DC | PRN

## 2020-04-23 MED ORDER — ONDANSETRON HCL 4 MG/2ML IJ SOLN: 4.0000 mg | Freq: Four times a day (QID) | INTRAMUSCULAR | Status: DC | PRN

## 2020-04-23 MED ORDER — SODIUM CHLORIDE 0.9 % IV SOLN
1.0000 g | INTRAVENOUS | Status: DC
  Administered 2020-04-23 – 2020-04-24 (×2): 1 g via INTRAVENOUS
  Filled 2020-04-23 (×2): qty 1

## 2020-04-23 MED ORDER — SODIUM CHLORIDE 0.9 % IV SOLN
500.0000 mg | INTRAVENOUS | Status: DC
  Administered 2020-04-24 (×2): 500 mg via INTRAVENOUS
  Filled 2020-04-23 (×2): qty 500

## 2020-04-23 MED ORDER — METHYLPREDNISOLONE SODIUM SUCC 125 MG IJ SOLR
1.0000 mg/kg | Freq: Two times a day (BID) | INTRAMUSCULAR | Status: DC
  Administered 2020-04-24 – 2020-04-25 (×3): 76.25 mg via INTRAVENOUS
  Filled 2020-04-23 (×3): qty 2

## 2020-04-23 MED ORDER — ONDANSETRON HCL 4 MG/2ML IJ SOLN
4.0000 mg | Freq: Once | INTRAMUSCULAR | Status: AC
  Administered 2020-04-23: 14:00:00 4 mg via INTRAVENOUS
  Filled 2020-04-23: qty 2

## 2020-04-23 NOTE — ED Notes (Signed)
ED TO INPATIENT HANDOFF REPORT  ED Nurse Name and Phone #: Emmalin Jaquess T RN   S Name/Age/Gender Barbara Alvarez 54 y.o. female Room/Bed: WA04/WA04  Code Status   Code Status: Prior  Home/SNF/Other Home Patient oriented to: self, place, time and situation Is this baseline? Yes   Triage Complete: Triage complete  Chief Complaint Pneumonia due to COVID-19 virus [U07.1, J12.82]  Triage Note Pt arrives [facility] from home with complaints of chest pain,cough,fever,body aches, general malaise since Monday. Pt reports chills and dizziness. Pt is unvaccinated     Allergies Allergies  Allergen Reactions   Haloperidol Decanoate Anaphylaxis   Ibuprofen Shortness Of Breath   Tramadol Swelling   Flexeril [Cyclobenzaprine] Other (See Comments)    Unknown   Naproxen Other (See Comments)    Nasal Drainage    Level of Care/Admitting Diagnosis ED Disposition    ED Disposition Condition Comment   Admit  Hospital Area: [facility] [[id number]]  Level of Care: Med-Surg [16]  May admit patient to [facility] or [facility] if equivalent level of care is available:: Yes  Covid Evaluation: Confirmed COVID Positive  Diagnosis: Pneumonia due to COVID-19 virus [[id number]]  Admitting Physician: Noralee Stain [[id number]]  Attending Physician: Noralee Stain [phone]  Estimated length of stay: 3 - 4 days  Certification:: I certify this patient will need inpatient services for at least 2 midnights       B Medical/Surgery History Past Medical History:  Diagnosis Date   Bipolar 1 disorder (HCC)    Bronchitis    Chronic back pain    Chronic dental pain    Chronic neck pain    Chronic pain in left foot    since great toe fx   Claustrophobia    Headache    Hearing loss in right ear    Hypertension    Shoulder pain    Past Surgical History:  Procedure Laterality Date   CHOLECYSTECTOMY     LIGAMENT REPAIR     left arm     A IV  Location/Drains/Wounds Patient Lines/Drains/Airways Status    Active Line/Drains/Airways    Name Placement date Placement time Site Days   Peripheral IV 04/23/20 Left Antecubital 04/23/20  --  Antecubital  less than 1   Peripheral IV 04/23/20 Left Forearm 04/23/20  1530  Forearm  less than 1          Intake/Output Last 24 hours No intake or output data in the 24 hours ending 04/23/20 1812  Labs/Imaging Results for orders placed or performed during the hospital encounter of 04/23/20 (from the past 48 hour(s))  Ferritin     Status: Abnormal   Collection Time: 04/23/20 12:07 PM  Result Value Ref Range   Ferritin 696 (H) 11 - 307 ng/mL    Comment: Performed at [facility], [address] [place], [place] [address]  C-reactive protein     Status: Abnormal   Collection Time: 04/23/20 12:07 PM  Result Value Ref Range   CRP 10.8 (H) <1.0 mg/dL    Comment: Performed at [facility] [place], [address], [place], [place] [address]  Comprehensive metabolic panel     Status: Abnormal   Collection Time: 04/23/20 12:08 PM  Result Value Ref Range   Sodium 137 135 - 145 mmol/L   Potassium 2.9 (L) 3.5 - 5.1 mmol/L   Chloride 103 98 - 111 mmol/L   CO2 21 (L) 22 - 32 mmol/L   Glucose, Bld 93  70 - 99 mg/dL    Comment: Glucose reference range applies only to samples taken after fasting for at least 8 hours.   BUN 10 6 - 20 mg/dL   Creatinine, Ser 1.610.89 0.44 - 1.00 mg/dL   Calcium 8.1 (L) 8.9 - 10.3 mg/dL   Total Protein 7.4 6.5 - 8.1 g/dL   Albumin 3.6 3.5 - 5.0 g/dL   AST 46 (H) 15 - 41 U/L   ALT 39 0 - 44 U/L   Alkaline Phosphatase 52 38 - 126 U/L   Total Bilirubin 1.2 0.3 - 1.2 mg/dL   GFR, Estimated >09>60 >60>60 mL/min    Comment: (NOTE) Calculated using the CKD-EPI Creatinine Equation (2021)    Anion gap 13 5 - 15    Comment: Performed at [facility], [address] [place], [place] [address]  CBC with Differential     Status:  Abnormal   Collection Time: 04/23/20 12:08 PM  Result Value Ref Range   WBC 3.6 (L) 4.0 - 10.5 K/uL   RBC 4.10 3.87 - 5.11 MIL/uL   Hemoglobin 11.9 (L) 12.0 - 15.0 g/dL   HCT 81.135.2 (L) 91.436.0 - 78.246.0 %   MCV 85.9 80.0 - 100.0 fL   MCH 29.0 26.0 - 34.0 pg   MCHC 33.8 30.0 - 36.0 g/dL   RDW 95.613.7 21.311.5 - 08.615.5 %   Platelets 234 150 - 400 K/uL   nRBC 0.0 0.0 - 0.2 %   Neutrophils Relative % 54 %   Neutro Abs 2.0 1.7 - 7.7 K/uL   Lymphocytes Relative 33 %   Lymphs Abs 1.2 0.7 - 4.0 K/uL   Monocytes Relative 12 %   Monocytes Absolute 0.4 0.1 - 1.0 K/uL   Eosinophils Relative 0 %   Eosinophils Absolute 0.0 0.0 - 0.5 K/uL   Basophils Relative 0 %   Basophils Absolute 0.0 0.0 - 0.1 K/uL   Immature Granulocytes 1 %   Abs Immature Granulocytes 0.03 0.00 - 0.07 K/uL    Comment: Performed at [facility], [address] [place], [place] [address]  Troponin I (High Sensitivity)     Status: Abnormal   Collection Time: 04/23/20 12:08 PM  Result Value Ref Range   Troponin I (High Sensitivity) 20 (H) <18 ng/L    Comment: (NOTE) Elevated high sensitivity troponin I (hsTnI) values and significant  changes across serial measurements may suggest ACS but many other  chronic and acute conditions are known to elevate hsTnI results.  Refer to the "Links" section for chest pain algorithms and additional  guidance. Performed at [facility], [address] [place], [place] [address]   Resp Panel by RT-PCR (Flu A&B, Covid) Nasopharyngeal Swab     Status: Abnormal   Collection Time: 04/23/20 12:20 PM   Specimen: Nasopharyngeal Swab; Nasopharyngeal(NP) swabs in vial transport medium  Result Value Ref Range   SARS Coronavirus 2 by RT PCR POSITIVE (A) NEGATIVE    Comment: RESULT CALLED TO, READ BACK BY AND VERIFIED WITH: CLAPP,S. RN @1445  04/23/20 BILLINGSLEY,L (NOTE) SARS-CoV-2 target nucleic acids are DETECTED.  The SARS-CoV-2 RNA is generally detectable in upper  respiratory specimens during the acute phase of infection. Positive results are indicative of the presence of the identified virus, but do not rule out bacterial infection or co-infection with other pathogens not detected by the test. Clinical correlation with patient history and other diagnostic information is necessary to determine patient infection status. The expected result is Negative.  Fact Sheet for Patients: BloggerCourse.com  Fact Sheet for Healthcare Providers: SeriousBroker.it  This test is not yet approved or cleared by the [place] FDA and  has been authorized for detection and/or diagnosis of SARS-CoV-2 by FDA under an Emergency Use Authorization (EUA).  This EUA will remain in effect (meaning this test  can be used) for the duration of  the COVID-19 declaration under Section 564(b)(1) of the Act, 21 U.S.C. section 360bbb-3(b)(1), unless the authorization is terminated or revoked sooner.     Influenza A by PCR NEGATIVE NEGATIVE   Influenza B by PCR NEGATIVE NEGATIVE    Comment: (NOTE) The Xpert Xpress SARS-CoV-2/FLU/RSV plus assay is intended as an aid in the diagnosis of influenza from Nasopharyngeal swab specimens and should not be used as a sole basis for treatment. Nasal washings and aspirates are unacceptable for Xpert Xpress SARS-CoV-2/FLU/RSV testing.  Fact Sheet for Patients: BloggerCourse.com  Fact Sheet for Healthcare Providers: SeriousBroker.it  This test is not yet approved or cleared by the [place] FDA and has been authorized for detection and/or diagnosis of SARS-CoV-2 by FDA under an Emergency Use Authorization (EUA). This EUA will remain in effect (meaning this test can be used) for the duration of the COVID-19 declaration under Section 564(b)(1) of the Act, 21 U.S.C. section 360bbb-3(b)(1), unless the authorization is terminated  or revoked.  Performed at [facility], [address], [place], [place] [address]   Troponin I (High Sensitivity)     Status: None   Collection Time: 04/23/20  2:14 PM  Result Value Ref Range   Troponin I (High Sensitivity) 17 <18 ng/L    Comment: (NOTE) Elevated high sensitivity troponin I (hsTnI) values and significant  changes across serial measurements may suggest ACS but many other  chronic and acute conditions are known to elevate hsTnI results.  Refer to the "Links" section for chest pain algorithms and additional  guidance. Performed at [facility], [address], [place], [place] [address]   Triglycerides     Status: None   Collection Time: 04/23/20  2:14 PM  Result Value Ref Range   Triglycerides 80 <150 mg/dL    Comment: Performed at [facility], [address], [place], [place] [address]  Lactic acid, plasma     Status: None   Collection Time: 04/23/20  3:32 PM  Result Value Ref Range   Lactic Acid, Venous 0.8 0.5 - 1.9 mmol/L    Comment: Performed at [facility], [address], [place], [place] [address]  D-dimer, quantitative     Status: Abnormal   Collection Time: 04/23/20  3:32 PM  Result Value Ref Range   D-Dimer, Quant 0.83 (H) 0.00 - 0.50 ug/mL-FEU    Comment: (NOTE) At the manufacturer cut-off value of 0.5 g/mL FEU, this assay has a negative predictive value of 95-100%.This assay is intended for use in conjunction with a clinical pretest probability (PTP) assessment model to exclude pulmonary embolism (PE) and deep venous thrombosis (DVT) in outpatients suspected of PE or DVT. Results should be correlated with clinical presentation. Performed at [facility], [address] [place], [place] [address]   Procalcitonin     Status: None   Collection Time: 04/23/20  3:32 PM  Result Value Ref Range   Procalcitonin <0.10 ng/mL    Comment:         Interpretation: PCT (Procalcitonin) <= 0.5 ng/mL: Systemic infection (sepsis) is not likely. Local bacterial infection is  possible. (NOTE)       Sepsis PCT Algorithm           Lower Respiratory Tract                                      Infection PCT Algorithm    ----------------------------     ----------------------------         PCT < 0.25 ng/mL                PCT < 0.10 ng/mL          Strongly encourage             Strongly discourage   discontinuation of antibiotics    initiation of antibiotics    ----------------------------     -----------------------------       PCT 0.25 - 0.50 ng/mL            PCT 0.10 - 0.25 ng/mL               OR       >80% decrease in PCT            Discourage initiation of                                            antibiotics      Encourage discontinuation           of antibiotics    ----------------------------     -----------------------------         PCT >= 0.50 ng/mL              PCT 0.26 - 0.50 ng/mL               AND        <80% decrease in PCT             Encourage initiation of                                             antibiotics       Encourage continuation           of antibiotics    ----------------------------     -----------------------------        PCT >= 0.50 ng/mL                  PCT > 0.50 ng/mL               AND         increase in PCT                  Strongly encourage                                      initiation of antibiotics    Strongly encourage escalation           of antibiotics                                     -----------------------------  PCT <= 0.25 ng/mL                                                 OR                                        > 80% decrease in PCT                                      Discontinue / Do not initiate                                             antibiotics  Performed at [facility], [address] [place], [place] [address]   Lactate dehydrogenase     Status: Abnormal   Collection Time: 04/23/20  3:32 PM  Result Value Ref Range   LDH 222 (H) 98 - 192 U/L    Comment: Performed at [facility], [address] [place], [place] [address]  Fibrinogen     Status: Abnormal   Collection Time: 04/23/20  3:32 PM  Result Value Ref Range   Fibrinogen 764 (H) 210 - 475 mg/dL    Comment: Performed at [facility], [address] [place], [place] [address]   DG Chest Port 1 View  Result Date: 04/23/2020 CLINICAL DATA:  Cough, chest pain, and shortness of breath. EXAM: PORTABLE CHEST 1 VIEW COMPARISON:  Chest x-ray dated March 06, 2017. FINDINGS: The patient is rotated to the left. The heart size and mediastinal contours are within normal limits. Normal pulmonary vascularity. Patchy consolidation in the left upper lobe. No pleural effusion or pneumothorax. No acute osseous abnormality. IMPRESSION: 1. Left upper lobe pneumonia. Electronically Signed   By: Obie Dredge M.D.   On: 04/23/2020 12:41    Pending Labs Unresulted Labs (From admission, onward)          Start     Ordered   04/23/20 1500  Lactic acid, plasma  Now then every 2 hours,   STAT      04/23/20 1459   04/23/20 1500  Blood Culture (routine x 2)  BLOOD CULTURE X 2,   STAT      04/23/20 1459   Signed and Held  HIV Antibody (routine testing w rflx)  (HIV Antibody (Routine testing w reflex) panel)  Once,   R        Signed and Held   Signed and Held  CBC  (enoxaparin (LOVENOX)    CrCl >/= 30 ml/min)  Once,   R       Comments: Baseline for enoxaparin therapy IF NOT ALREADY DRAWN.  Notify MD if PLT < 100 K.    Signed and Held   Signed and Held  Creatinine, serum  (enoxaparin (LOVENOX)    CrCl >/= 30 ml/min)  Once,   R       Comments: Baseline for enoxaparin therapy IF NOT ALREADY DRAWN.    Signed and Held   Signed and Held  Creatinine, serum  (enoxaparin (  LOVENOX)    CrCl >/= 30 ml/min)   Weekly,   R     Comments: while on enoxaparin therapy    Signed and Held   Signed and Held  CBC with Differential/Platelet  Daily,   R      Signed and Held   Signed and Held  Comprehensive metabolic panel  Daily,   R      Signed and Held   Signed and Held  C-reactive protein  Daily,   R      Signed and Held   Signed and Held  D-dimer, quantitative (not at [facility])  Daily,   R      Signed and Held   Signed and Held  Ferritin  Daily,   R      Signed and Held   Signed and Held  Magnesium  Daily,   R      Signed and Held   Signed and Held  Phosphorus  Daily,   R      Signed and Held          Vitals/Pain Today's Vitals   04/23/20 1433 04/23/20 1500 04/23/20 1700 04/23/20 1730  BP:  (!) 107/50 107/67 (!) 105/52  Pulse:  65 66 (!) 58  Resp:  20 (!) 22 (!) 22  Temp:      TempSrc:      SpO2:  97% 97% 98%  Weight:      Height:      PainSc: 8        Isolation Precautions Airborne and Contact precautions  Medications Medications  acetaminophen (TYLENOL) tablet 1,000 mg (1,000 mg Oral Given 04/23/20 1221)  albuterol (VENTOLIN HFA) 108 (90 Base) MCG/ACT inhaler 4 puff (4 puffs Inhalation Given 04/23/20 1222)  potassium chloride SA (KLOR-CON) CR tablet 40 mEq (40 mEq Oral Given 04/23/20 1400)  sodium chloride 0.9 % bolus 500 mL (0 mLs Intravenous Stopped 04/23/20 1530)  ondansetron (ZOFRAN) injection 4 mg (4 mg Intravenous Given 04/23/20 1400)  methylPREDNISolone sodium succinate (SOLU-MEDROL) 125 mg/2 mL injection 125 mg (125 mg Intravenous Given 04/23/20 1440)    Mobility walks Low fall risk   Focused Assessments Neuro Assessment Handoff:  Swallow screen pass? Yes          Neuro Assessment: Exceptions to WDL Neuro Checks:      Last Documented NIHSS Modified Score:   Has TPA been given? No If patient is a Neuro Trauma and patient is going to OR before floor call report to 4N Charge nurse: [phone] or [phone]  , Pulmonary Assessment Handoff:  Lung  sounds: Bilateral Breath Sounds: Expiratory wheezes,Inspiratory wheezes,Diminished O2 Device: Room Air        R Recommendations: See Admitting Provider Note  Report given to:   Additional Notes: COVID +

## 2020-04-23 NOTE — ED Notes (Signed)
Pt ambulated in room. Pts o2 sat remained 98%, HR:135bpm, RR:35. PA made aware. Pt endorsed dizziness when ambulating.

## 2020-04-23 NOTE — Progress Notes (Signed)
Report received from Jen Mow, RN in the ED.  Patient arrived to the floor approximately 1900.  Report given to [facility], Charity fundraiser.

## 2020-04-23 NOTE — ED Triage Notes (Signed)
Pt arrives [facility] from home with complaints of chest pain,cough,fever,body aches, general malaise since Monday. Pt reports chills and dizziness. Pt is unvaccinated

## 2020-04-23 NOTE — ED Provider Notes (Signed)
[facility]-EMERGENCY DEPT Provider Note   CSN: [id number] Arrival date & time: 04/23/20  1137     History Chief Complaint  Patient presents with  . Covid Like Symptoms    Barbara Alvarez is a 54 y.o. female.  Barbara Alvarez is a 54 y.o. female with a history of HTN, chronic bronchitis, bipolar disorder, who presents via EMS  for evaluation  of cough, chest pain, shortness of breath, chills and malaise since Monday. Patient Febrile to 102.2 on arrival, but unsure what her temps have been at home.  Reports cough has been productive of mucus, and worsening over the past 5 days, reports worsening shortness of breath and chest pain primarily when she coughs but also at rest.  Reports some generalized body aches.  Has had some nausea and had diarrhea on the first 2 days of symptoms, but denies abdominal pain.  Patient has not had her Covid vaccine, she is unsure if she has had any sick contacts.  No meds prior to arrival.  No other aggravating or alleviating factors.        Past Medical History:  Diagnosis Date  . Bipolar 1 disorder (HCC)   . Bronchitis   . Chronic back pain   . Chronic dental pain   . Chronic neck pain   . Chronic pain in left foot    since great toe fx  . Claustrophobia   . Headache   . Hearing loss in right ear   . Hypertension   . Shoulder pain     Patient Active Problem List   Diagnosis Date Noted  . Numbness and tingling of leg   . Pain in both lower legs 03/04/2019  . Cervical spine pain 01/21/2019  . Pain in right finger(s) 11/22/2016  . Finger pain, left 11/22/2016  . Chronic right shoulder pain 11/22/2016  . Essential hypertension 05/30/2016  . Right shoulder injury, initial encounter 05/30/2016  . Seasonal allergic rhinitis 05/30/2016  . Muscle spasm of right shoulder 05/30/2016  . Need for immunization against influenza 05/30/2016  . Bipolar disorder (HCC) 05/30/2016  . Acquired mallet finger of right hand 06/15/2015     Past Surgical History:  Procedure Laterality Date  . CHOLECYSTECTOMY    . LIGAMENT REPAIR     left arm     OB History   No obstetric history on file.     Family History  Problem Relation Age of Onset  . Cancer Mother   . Hypertension Father   . Diabetes Other     Social History   Tobacco Use  . Smoking status: Current Every Day Smoker    Packs/day: 0.25    Types: Cigarettes  . Smokeless tobacco: Never Used  Vaping Use  . Vaping Use: Never used  Substance Use Topics  . Alcohol use: Yes    Comment: hasn't had drink in about 1 wk  . Drug use: No    Home Medications Prior to Admission medications   Medication Sig Start Date End Date Taking? Authorizing Provider  acetaminophen-codeine (TYLENOL #3) 300-30 MG tablet Take 1-2 tablets by mouth every 8 (eight) hours as needed for moderate pain. 03/18/20   Kerrin Champagne, MD  albuterol (PROVENTIL HFA;VENTOLIN HFA) 108 (90 Base) MCG/ACT inhaler Inhale 2 puffs into the lungs every 6 (six) hours as needed for wheezing or shortness of breath. 05/30/16   Hedges, Tinnie Gens, PA-C  albuterol (PROVENTIL) (2.5 MG/3ML) 0.083% nebulizer solution Take 3 mLs (2.5 mg total) by  nebulization every 6 (six) hours as needed for wheezing or shortness of breath. 05/30/16   Hedges, Tinnie Gens, PA-C  atorvastatin (LIPITOR) 40 MG tablet Take 1 tablet (40 mg total) daily by mouth. 03/14/17   Bing Neighbors, FNP  carbamazepine (EQUETRO) 200 MG CP12 12 hr capsule Take 1 capsule (200 mg total) by mouth at bedtime. 09/07/14   Marlon Pel, PA-C  cetirizine (ZYRTEC) 10 MG tablet Take 1 tablet (10 mg total) by mouth daily. 12/17/17   Tilden Fossa, MD  diazepam (VALIUM) 5 MG tablet Take 5 mg at MRI scanner and may repeat one time after 30 minutes. 03/18/20   Kerrin Champagne, MD  fluticasone (FLONASE) 50 MCG/ACT nasal spray Place 2 sprays into both nostrils daily. 01/10/20   Joy, Shawn C, PA-C  hydrOXYzine (ATARAX/VISTARIL) 25 MG tablet Take 1-2 tablets (25-50 mg  total) by mouth every 6 (six) hours as needed for itching. 09/20/16   Bing Neighbors, FNP  lidocaine (LIDODERM) 5 % Place 1 patch onto the skin daily. Remove & Discard patch within 12 hours or as directed by MD 01/10/20   Joy, Shawn C, PA-C  lidocaine (XYLOCAINE) 2 % solution Use as directed 15 mLs in the mouth or throat as needed for mouth pain. 01/10/20   Joy, Shawn C, PA-C  lisinopril (PRINIVIL,ZESTRIL) 20 MG tablet Take 1 tablet (20 mg total) by mouth daily. 11/20/16   Bing Neighbors, FNP  methocarbamol (ROBAXIN) 500 MG tablet Take 1 tablet (500 mg total) by mouth 2 (two) times daily. 01/10/20   Joy, Shawn C, PA-C  polyethylene glycol (MIRALAX / GLYCOLAX) 17 g packet Take 17 g by mouth daily as needed for mild constipation. 03/06/19   Zannie Cove, MD  pregabalin (LYRICA) 75 MG capsule Take 1 capsule (75 mg total) by mouth 2 (two) times daily. 08/21/19   Kerrin Champagne, MD  risperiDONE (RISPERDAL) 0.5 MG tablet Take 0.5 mg by mouth 2 (two) times daily.     [provider]  tiZANidine (ZANAFLEX) 4 MG tablet Take 1 tablet (4 mg total) by mouth every 6 (six) hours as needed for muscle spasms. 08/16/17   Bethel Born, PA-C    Allergies    Haloperidol decanoate, Ibuprofen, Tramadol, Flexeril [cyclobenzaprine], and Naproxen  Review of Systems   Review of Systems  Constitutional: Positive for chills, fatigue and fever.  HENT: Positive for congestion and rhinorrhea.   Respiratory: Positive for cough and shortness of breath.   Cardiovascular: Positive for chest pain. Negative for palpitations and leg swelling.  Gastrointestinal: Positive for nausea. Negative for abdominal pain, diarrhea and vomiting.  Genitourinary: Negative for dysuria and frequency.  Musculoskeletal: Positive for myalgias. Negative for arthralgias.  Neurological: Negative for dizziness, syncope and light-headedness.  All other systems reviewed and are negative.   Physical Exam Updated Vital Signs BP 116/61    Pulse 77   Temp (!) 102.2 F (39 C) (Rectal)   Resp (!) 23   Ht  (1.676 m)   Wt 76.1 kg   LMP 09/06/2016   SpO2 100%   BMI 27.08 kg/m   Physical Exam Vitals and nursing note reviewed.  Constitutional:      General: She is not in acute distress.    Appearance: Normal appearance. She is well-developed and well-nourished. She is ill-appearing. She is not diaphoretic.     Comments: Patient is alert, but is ill-appearing, currently in no acute distress  HENT:     Head: Normocephalic and atraumatic.  Mouth/Throat:     Mouth: Oropharynx is clear and moist. Mucous membranes are moist.     Pharynx: Oropharynx is clear.  Eyes:     General:        Right eye: No discharge.        Left eye: No discharge.     Extraocular Movements: EOM normal.  Cardiovascular:     Rate and Rhythm: Regular rhythm. Tachycardia present.     Pulses: Intact distal pulses.     Heart sounds: Normal heart sounds. No murmur heard. No friction rub. No gallop.   Pulmonary:     Effort: No respiratory distress.     Breath sounds: Wheezing present. No rales.     Comments: Patient is tachypneic with increased respiratory effort, able to speak in short sentences, satting at 96% on room air, on auscultation she has wheezing with decreased air movement bilaterally Abdominal:     General: Bowel sounds are normal. There is no distension.     Palpations: Abdomen is soft. There is no mass.     Tenderness: There is no abdominal tenderness. There is no guarding.     Comments: Abdomen soft, nondistended, nontender to palpation in all quadrants without guarding or peritoneal signs   Musculoskeletal:        General: No deformity or edema.     Cervical back: Neck supple.     Right lower leg: No edema.     Left lower leg: No edema.  Skin:    General: Skin is warm and dry.     Capillary Refill: Capillary refill takes less than 2 seconds.  Neurological:     Mental Status: She is alert.     Coordination: Coordination  normal.     Comments: Speech is clear, able to follow commands Moves extremities without ataxia, coordination intact  Psychiatric:        Mood and Affect: Mood normal.        Behavior: Behavior normal.     ED Results / Procedures / Treatments   Labs (all labs ordered are listed, but only abnormal results are displayed) Labs Reviewed  RESP PANEL BY RT-PCR (FLU A&B, COVID) ARPGX2 - Abnormal; Notable for the following components:      Result Value   SARS Coronavirus 2 by RT PCR POSITIVE (*)    All other components within normal limits  COMPREHENSIVE METABOLIC PANEL - Abnormal; Notable for the following components:   Potassium 2.9 (*)    CO2 21 (*)    Calcium 8.1 (*)    AST 46 (*)    All other components within normal limits  CBC WITH DIFFERENTIAL/PLATELET - Abnormal; Notable for the following components:   WBC 3.6 (*)    Hemoglobin 11.9 (*)    HCT 35.2 (*)    All other components within normal limits  D-DIMER, QUANTITATIVE (NOT AT [facility]) - Abnormal; Notable for the following components:   D-Dimer, Quant 0.83 (*)    All other components within normal limits  LACTATE DEHYDROGENASE - Abnormal; Notable for the following components:   LDH 222 (*)    All other components within normal limits  FERRITIN - Abnormal; Notable for the following components:   Ferritin 696 (*)    All other components within normal limits  FIBRINOGEN - Abnormal; Notable for the following components:   Fibrinogen 764 (*)    All other components within normal limits  C-REACTIVE PROTEIN - Abnormal; Notable for the following components:   CRP 10.8 (*)  All other components within normal limits  CBC WITH DIFFERENTIAL/PLATELET - Abnormal; Notable for the following components:   WBC 2.5 (*)    Hemoglobin 11.4 (*)    HCT 34.8 (*)    Neutro Abs 1.6 (*)    All other components within normal limits  COMPREHENSIVE METABOLIC PANEL - Abnormal; Notable for the following components:   CO2 21 (*)    Glucose, Bld 123  (*)    Calcium 7.9 (*)    Albumin 3.1 (*)    All other components within normal limits  C-REACTIVE PROTEIN - Abnormal; Notable for the following components:   CRP 11.2 (*)    All other components within normal limits  D-DIMER, QUANTITATIVE (NOT AT [facility]) - Abnormal; Notable for the following components:   D-Dimer, Quant 0.73 (*)    All other components within normal limits  FERRITIN - Abnormal; Notable for the following components:   Ferritin 693 (*)    All other components within normal limits  MAGNESIUM - Abnormal; Notable for the following components:   Magnesium 2.5 (*)    All other components within normal limits  TROPONIN I (HIGH SENSITIVITY) - Abnormal; Notable for the following components:   Troponin I (High Sensitivity) 20 (*)    All other components within normal limits  CULTURE, BLOOD (ROUTINE X 2)  LACTIC ACID, PLASMA  PROCALCITONIN  TRIGLYCERIDES  HIV ANTIBODY (ROUTINE TESTING W REFLEX)  PHOSPHORUS  TROPONIN I (HIGH SENSITIVITY)    EKG EKG Interpretation  Date/Time:  Friday April 23 2020 11:54:43 EST Ventricular Rate:  74 PR Interval:    QRS Duration: 101 QT Interval:  389 QTC Calculation: 432 R Axis:   77 Text Interpretation: Sinus arrhythmia Consider left atrial enlargement Confirmed by Marily Memos [phone]) on 04/24/2020 12:38:39 PM   Radiology DG Chest Port 1 View  Result Date: 04/23/2020 CLINICAL DATA:  Cough, chest pain, and shortness of breath. EXAM: PORTABLE CHEST 1 VIEW COMPARISON:  Chest x-ray dated March 06, 2017. FINDINGS: The patient is rotated to the left. The heart size and mediastinal contours are within normal limits. Normal pulmonary vascularity. Patchy consolidation in the left upper lobe. No pleural effusion or pneumothorax. No acute osseous abnormality. IMPRESSION: 1. Left upper lobe pneumonia. Electronically Signed   By: Obie Dredge M.D.   On: 04/23/2020 12:41    Procedures Procedures (including critical care  time)  Medications Ordered in ED Medications  acetaminophen (TYLENOL) tablet 1,000 mg (1,000 mg Oral Given 04/23/20 1221)  albuterol (VENTOLIN HFA) 108 (90 Base) MCG/ACT inhaler 4 puff (4 puffs Inhalation Given 04/23/20 1222)  potassium chloride SA (KLOR-CON) CR tablet 40 mEq (40 mEq Oral Given 04/23/20 1400)  sodium chloride 0.9 % bolus 500 mL (0 mLs Intravenous Stopped 04/23/20 1530)  ondansetron (ZOFRAN) injection 4 mg (4 mg Intravenous Given 04/23/20 1400)  methylPREDNISolone sodium succinate (SOLU-MEDROL) 125 mg/2 mL injection 125 mg (125 mg Intravenous Given 04/23/20 1440)    ED Course  I have reviewed the triage vital signs and the nursing notes.  Pertinent labs & imaging results that were available during my care of the patient were reviewed by me and considered in my medical decision making (see chart for details).    MDM Rules/Calculators/A&P                         54 year old female presents with 5 days of cough, chest pain, shortness of breath, fevers and malaise. Found to be Covid positive, unvaccinated.  Lab work shows leukopenia, stable hemoglobin, potassium of 2.9, no other significant electrolyte derangements, normal renal and liver function.  Chest x-ray shows left upper lobe pneumonia.  On room air patient's O2 sats have been stable, but with ambulation patient became tachycardic to 135 and significantly tachypneic with respiratory rates in the 30s-40s and significantly increased respiratory effort, high concern patient will become hypoxic and will worsen clinically, will require hospital admission, patient treated with albuterol and steroids, given that she does not currently have an oxygen requirement we will hold off on remdesivir and consult medicine.  Case discussed with Dr. Alvino Chapel with [facility] hospitalist will see and admit the patient.  Final Clinical Impression(s) / ED Diagnoses Final diagnoses:  Pneumonia due to COVID-19 virus    Rx / DC Orders ED  Discharge Orders    None       Legrand Rams 04/26/20 4098    Lorre Nick, MD 04/28/20 2315

## 2020-04-23 NOTE — H&P (Signed)
History and Physical    Barbara Alvarez WUJ:[id number] DOB: [birth year] DOA: 04/23/2020  PCP: Leilani Ableeese, Betti, MD  Patient coming from: Home  Chief Complaint: Chest pain, cough, fever, body aches, general malaise, chills, dizziness   HPI: Barbara Alvarez is a 54 y.o. female with medical history significant of COPD, HLD, HTN, bipolar disorder who presents to the emergency department with chief complaint of general malaise, subjective fevers and chills, cough and shortness of breath.  She states that her symptoms started Monday and has progressively gotten worse.  She also admits to nausea, vomiting, diarrhea and some abdominal discomfort.  In the emergency department, patient tested positive for COVID-19.  Upon ambulation, her oxygen saturation maintained at 98%, however she became tachycardic and tachypneic and symptomatic.  TRH consulted for admission.  Review of Systems: As per HPI. Otherwise, all other review of systems reviewed and are negative.   Past Medical History:  Diagnosis Date  . Bipolar 1 disorder (HCC)   . Bronchitis   . Chronic back pain   . Chronic dental pain   . Chronic neck pain   . Chronic pain in left foot    since great toe fx  . Claustrophobia   . Headache   . Hearing loss in right ear   . Hypertension   . Shoulder pain     Past Surgical History:  Procedure Laterality Date  . CHOLECYSTECTOMY    . LIGAMENT REPAIR     left arm     reports that she has been smoking cigarettes. She has been smoking about 0.25 packs per day. She has never used smokeless tobacco. She reports current alcohol use. She reports that she does not use drugs.  Allergies  Allergen Reactions  . Haloperidol Decanoate Anaphylaxis  . Ibuprofen Shortness Of Breath  . Tramadol Swelling  . Flexeril [Cyclobenzaprine] Other (See Comments)    Unknown  . Naproxen Other (See Comments)    Nasal Drainage    Family History  Problem Relation Age of Onset  . Cancer Mother   . Hypertension  Father   . Diabetes Other     Prior to Admission medications   Medication Sig Start Date End Date Taking? Authorizing Provider  acetaminophen-codeine (TYLENOL #3) 300-30 MG tablet Take 1-2 tablets by mouth every 8 (eight) hours as needed for moderate pain. 03/18/20   Kerrin ChampagneNitka, James E, MD  albuterol (PROVENTIL HFA;VENTOLIN HFA) 108 (90 Base) MCG/ACT inhaler Inhale 2 puffs into the lungs every 6 (six) hours as needed for wheezing or shortness of breath. 05/30/16   Hedges, Tinnie GensJeffrey, PA-C  albuterol (PROVENTIL) (2.5 MG/3ML) 0.083% nebulizer solution Take 3 mLs (2.5 mg total) by nebulization every 6 (six) hours as needed for wheezing or shortness of breath. 05/30/16   Hedges, Tinnie GensJeffrey, PA-C  atorvastatin (LIPITOR) 40 MG tablet Take 1 tablet (40 mg total) daily by mouth. 03/14/17   Bing NeighborsHarris, Kimberly S, FNP  carbamazepine (EQUETRO) 200 MG CP12 12 hr capsule Take 1 capsule (200 mg total) by mouth at bedtime. 09/07/14   Marlon PelGreene, Tiffany, PA-C  cetirizine (ZYRTEC) 10 MG tablet Take 1 tablet (10 mg total) by mouth daily. 12/17/17   Tilden Fossaees, Elizabeth, MD  diazepam (VALIUM) 5 MG tablet Take 5 mg at MRI scanner and may repeat one time after 30 minutes. 03/18/20   Kerrin ChampagneNitka, James E, MD  fluticasone (FLONASE) 50 MCG/ACT nasal spray Place 2 sprays into both nostrils daily. 01/10/20   Joy, Shawn C, PA-C  hydrOXYzine (ATARAX/VISTARIL) 25 MG tablet Take 1-2  tablets (25-50 mg total) by mouth every 6 (six) hours as needed for itching. 09/20/16   Bing Neighbors, FNP  lidocaine (LIDODERM) 5 % Place 1 patch onto the skin daily. Remove & Discard patch within 12 hours or as directed by MD 01/10/20   Joy, Shawn C, PA-C  lidocaine (XYLOCAINE) 2 % solution Use as directed 15 mLs in the mouth or throat as needed for mouth pain. 01/10/20   Joy, Shawn C, PA-C  lisinopril (PRINIVIL,ZESTRIL) 20 MG tablet Take 1 tablet (20 mg total) by mouth daily. 11/20/16   Bing Neighbors, FNP  methocarbamol (ROBAXIN) 500 MG tablet Take 1 tablet (500 mg total) by  mouth 2 (two) times daily. 01/10/20   Joy, Shawn C, PA-C  polyethylene glycol (MIRALAX / GLYCOLAX) 17 g packet Take 17 g by mouth daily as needed for mild constipation. 03/06/19   Zannie Cove, MD  pregabalin (LYRICA) 75 MG capsule Take 1 capsule (75 mg total) by mouth 2 (two) times daily. 08/21/19   Kerrin Champagne, MD  risperiDONE (RISPERDAL) 0.5 MG tablet Take 0.5 mg by mouth 2 (two) times daily.     [provider]  tiZANidine (ZANAFLEX) 4 MG tablet Take 1 tablet (4 mg total) by mouth every 6 (six) hours as needed for muscle spasms. 08/16/17   Bethel Born, PA-C    Physical Exam: Vitals:   04/23/20 1330 04/23/20 1400 04/23/20 1433 04/23/20 1500  BP: 125/67 115/65  (!) 107/50  Pulse: 77 80  65  Resp: (!) 23 (!) 22  20  Temp:   99 F (37.2 C)   TempSrc:   Oral   SpO2: 98% 100%  97%  Weight:      Height:         Constitutional: NAD, calm, comfortable Eyes: PERRL, lids and conjunctivae normal ENMT: Deferred as patient is wearing a mask Respiratory: Clear to auscultation bilaterally, no wheezing, no crackles. Normal respiratory effort. No accessory muscle use. No conversational dyspnea.  On room air Cardiovascular: Regular rate and rhythm, no murmurs. No extremity edema.  Abdomen: Soft, nondistended, nontender to palpation. Bowel sounds positive.  Musculoskeletal: No joint deformity upper and lower extremities. No contractures. Normal muscle tone.  Skin: no rashes, lesions, ulcers on exposed skin  Neurologic: Alert and oriented, speech fluent, No focal deficits.   Psychiatric: Normal judgment and insight. Normal mood and affect   Labs on Admission: I have personally reviewed following labs and imaging studies  CBC: Recent Labs  Lab 04/23/20 1208  WBC 3.6*  NEUTROABS 2.0  HGB 11.9*  HCT 35.2*  MCV 85.9  PLT 234   Basic Metabolic Panel: Recent Labs  Lab 04/23/20 1208  NA 137  K 2.9*  CL 103  CO2 21*  GLUCOSE 93  BUN 10  CREATININE 0.89  CALCIUM 8.1*    GFR: Estimated Creatinine Clearance: 77 mL/min (by C-G formula based on SCr of 0.89 mg/dL). Liver Function Tests: Recent Labs  Lab 04/23/20 1208  AST 46*  ALT 39  ALKPHOS 52  BILITOT 1.2  PROT 7.4  ALBUMIN 3.6   No results for input(s): LIPASE, AMYLASE in the last 168 hours. No results for input(s): AMMONIA in the last 168 hours. Coagulation Profile: No results for input(s): INR, PROTIME in the last 168 hours. Cardiac Enzymes: No results for input(s): CKTOTAL, CKMB, CKMBINDEX, TROPONINI in the last 168 hours. BNP (last 3 results) No results for input(s): PROBNP in the last 8760 hours. HbA1C: No results for input(s):  HGBA1C in the last 72 hours. CBG: No results for input(s): GLUCAP in the last 168 hours. Lipid Profile: Recent Labs    04/23/20 1414  TRIG 80   Thyroid Function Tests: No results for input(s): TSH, T4TOTAL, FREET4, T3FREE, THYROIDAB in the last 72 hours. Anemia Panel: Recent Labs    04/23/20 1207  FERRITIN 696*   Urine analysis:    Component Value Date/Time   COLORURINE STRAW (A) 03/05/2019 1655   APPEARANCEUR CLEAR 03/05/2019 1655   LABSPEC 1.002 (L) 03/05/2019 1655   PHURINE 7.0 03/05/2019 1655   GLUCOSEU NEGATIVE 03/05/2019 1655   HGBUR NEGATIVE 03/05/2019 1655   BILIRUBINUR NEGATIVE 03/05/2019 1655   KETONESUR NEGATIVE 03/05/2019 1655   PROTEINUR NEGATIVE 03/05/2019 1655   UROBILINOGEN 0.2 11/20/2016 0918   NITRITE NEGATIVE 03/05/2019 1655   LEUKOCYTESUR NEGATIVE 03/05/2019 1655   Sepsis Labs: !!!!!!!!!!!!!!!!!!!!!!!!!!!!!!!!!!!!!!!!!!!! @LABRCNTIP (procalcitonin:4,lacticidven:4) ) Recent Results (from the past 240 hour(s))  Resp Panel by RT-PCR (Flu A&B, Covid) Nasopharyngeal Swab     Status: Abnormal   Collection Time: 04/23/20 12:20 PM   Specimen: Nasopharyngeal Swab; Nasopharyngeal(NP) swabs in vial transport medium  Result Value Ref Range Status   SARS Coronavirus 2 by RT PCR POSITIVE (A) NEGATIVE Final    Comment: RESULT CALLED  TO, READ BACK BY AND VERIFIED WITH: CLAPP,S. RN @1445  04/23/20 BILLINGSLEY,L (NOTE) SARS-CoV-2 target nucleic acids are DETECTED.  The SARS-CoV-2 RNA is generally detectable in upper respiratory specimens during the acute phase of infection. Positive results are indicative of the presence of the identified virus, but do not rule out bacterial infection or co-infection with other pathogens not detected by the test. Clinical correlation with patient history and other diagnostic information is necessary to determine patient infection status. The expected result is Negative.  Fact Sheet for Patients:  Fact Sheet for Healthcare Providers: 04/25/20  This test is not yet approved or cleared by the [employer] FDA and  has been authorized for detection and/or diagnosis of SARS-CoV-2 by FDA under an Emergency Use Authorization (EUA).  This EUA will remain in effect (meaning this test  can be used) for the duration of  the COVID-19 declaration under Section 564(b)(1) of the Act, 21 U.S.C. section 360bbb-3(b)(1), unless the authorization is terminated or revoked sooner.     Influenza A by PCR NEGATIVE NEGATIVE Final   Influenza B by PCR NEGATIVE NEGATIVE Final    Comment: (NOTE) The Xpert Xpress SARS-CoV-2/FLU/RSV plus assay is intended as an aid in the diagnosis of influenza from Nasopharyngeal swab specimens and should not be used as a sole basis for treatment. Nasal washings and aspirates are unacceptable for Xpert Xpress SARS-CoV-2/FLU/RSV testing.  Fact Sheet for Patients: SeriousBroker.it  Fact Sheet for Healthcare Providers: [place]  This test is not yet approved or cleared by the [employer] FDA and has been authorized for detection and/or diagnosis of SARS-CoV-2 by FDA under an Emergency Use Authorization (EUA). This EUA will remain in  effect (meaning this test can be used) for the duration of the COVID-19 declaration under Section 564(b)(1) of the Act, 21 U.S.C. section 360bbb-3(b)(1), unless the authorization is terminated or revoked.  Performed at [facility], [address], [place], [place]      Radiological Exams on Admission: DG Chest Port 1 View  Result Date: 04/23/2020 CLINICAL DATA:  Cough, chest pain, and shortness of breath. EXAM: PORTABLE CHEST 1 VIEW COMPARISON:  Chest x-ray dated March 06, 2017. FINDINGS: The patient is rotated to the left.  The heart size and mediastinal contours are within normal limits. Normal pulmonary vascularity. Patchy consolidation in the left upper lobe. No pleural effusion or pneumothorax. No acute osseous abnormality. IMPRESSION: 1. Left upper lobe pneumonia. Electronically Signed   By: Obie Dredge M.D.   On: 04/23/2020 12:41    EKG: Independently reviewed.  Normal sinus rhythm with PAC.    Assessment/Plan Principal Problem:   Pneumonia due to COVID-19 virus Active Problems:   Essential hypertension   Bipolar disorder (HCC)   Hyperlipidemia   Hypokalemia   COVID-19 Pneumonia -Tested positive on 12/17. Unvaccinated  -Started on steroids  -Does not meet criteria for remdesivir or baricitinib as she is not hypoxic  -Supportive treatments as ordered, encourage IS, encourage prone positioning, encourage mobilization  -Empiric rocephin/azithromycin due to LUL consolidation   COVID-19 Labs - pending   Recent Labs    04/23/20 1207  FERRITIN 696*  CRP 10.8*    Lab Results  Component Value Date   SARSCOV2NAA POSITIVE (A) 04/23/2020   SARSCOV2NAA NEGATIVE 03/04/2019    Bipolar disorder -Risperdal   Hypokalemia -Replace    DVT prophylaxis: Lovenox Code Status: Full confirmed with patient on admission  Family Communication: None at bedside  Disposition Plan: Pending clinical improvement, can return home Consults called:  None    Status is: Inpatient  Remains inpatient appropriate because:IV treatments appropriate due to intensity of illness or inability to take PO and Inpatient level of care appropriate due to severity of illness   Dispo: The patient is from: Home              Anticipated d/c is to: Home              Anticipated d/c date is: 3 days              Patient currently is not medically stable to d/c.     Noralee Stain, DO [facility] 04/23/2020, 4:20 PM   Available via Epic secure chat 7am-7pm After these hours, please refer to coverage provider listed on [email or web address]

## 2020-04-23 NOTE — ED Notes (Signed)
Pts food tray ordered at this time

## 2020-04-24 DIAGNOSIS — F3163 Bipolar disorder, current episode mixed, severe, without psychotic features: Secondary | ICD-10-CM

## 2020-04-24 DIAGNOSIS — U071 COVID-19: Principal | ICD-10-CM

## 2020-04-24 DIAGNOSIS — J1282 Pneumonia due to coronavirus disease 2019: Secondary | ICD-10-CM

## 2020-04-24 DIAGNOSIS — I10 Essential (primary) hypertension: Secondary | ICD-10-CM

## 2020-04-24 LAB — CBC WITH DIFFERENTIAL/PLATELET
Abs Immature Granulocytes: 0.02 10*3/uL (ref 0.00–0.07)
Basophils Absolute: 0 10*3/uL (ref 0.0–0.1)
Basophils Relative: 0 %
Eosinophils Absolute: 0 10*3/uL (ref 0.0–0.5)
Eosinophils Relative: 0 %
HCT: 34.8 % — ABNORMAL LOW (ref 36.0–46.0)
Hemoglobin: 11.4 g/dL — ABNORMAL LOW (ref 12.0–15.0)
Immature Granulocytes: 1 %
Lymphocytes Relative: 26 %
Lymphs Abs: 0.7 10*3/uL (ref 0.7–4.0)
MCH: 28.6 pg (ref 26.0–34.0)
MCHC: 32.8 g/dL (ref 30.0–36.0)
MCV: 87.4 fL (ref 80.0–100.0)
Monocytes Absolute: 0.3 10*3/uL (ref 0.1–1.0)
Monocytes Relative: 10 %
Neutro Abs: 1.6 10*3/uL — ABNORMAL LOW (ref 1.7–7.7)
Neutrophils Relative %: 63 %
Platelets: 250 10*3/uL (ref 150–400)
RBC: 3.98 MIL/uL (ref 3.87–5.11)
RDW: 14 % (ref 11.5–15.5)
WBC: 2.5 10*3/uL — ABNORMAL LOW (ref 4.0–10.5)
nRBC: 0 % (ref 0.0–0.2)

## 2020-04-24 LAB — COMPREHENSIVE METABOLIC PANEL
ALT: 36 U/L (ref 0–44)
AST: 36 U/L (ref 15–41)
Albumin: 3.1 g/dL — ABNORMAL LOW (ref 3.5–5.0)
Alkaline Phosphatase: 45 U/L (ref 38–126)
Anion gap: 14 (ref 5–15)
BUN: 11 mg/dL (ref 6–20)
CO2: 21 mmol/L — ABNORMAL LOW (ref 22–32)
Calcium: 7.9 mg/dL — ABNORMAL LOW (ref 8.9–10.3)
Chloride: 104 mmol/L (ref 98–111)
Creatinine, Ser: 0.69 mg/dL (ref 0.44–1.00)
GFR, Estimated: >60 mL/min (ref >60)
Glucose, Bld: 123 mg/dL — ABNORMAL HIGH (ref 70–99)
Potassium: 3.7 mmol/L (ref 3.5–5.1)
Sodium: 139 mmol/L (ref 135–145)
Total Bilirubin: 0.6 mg/dL (ref 0.3–1.2)
Total Protein: 6.9 g/dL (ref 6.5–8.1)

## 2020-04-24 LAB — C-REACTIVE PROTEIN: CRP: 11.2 mg/dL — ABNORMAL HIGH (ref <1.0)

## 2020-04-24 LAB — PHOSPHORUS: Phosphorus: 3 mg/dL (ref 2.5–4.6)

## 2020-04-24 LAB — D-DIMER, QUANTITATIVE: D-Dimer, Quant: 0.73 ug/mL-FEU — ABNORMAL HIGH (ref 0.00–0.50)

## 2020-04-24 LAB — MAGNESIUM: Magnesium: 2.5 mg/dL — ABNORMAL HIGH (ref 1.7–2.4)

## 2020-04-24 LAB — HIV ANTIBODY (ROUTINE TESTING W REFLEX): HIV Screen 4th Generation wRfx: NONREACTIVE

## 2020-04-24 LAB — FERRITIN: Ferritin: 693 ng/mL — ABNORMAL HIGH (ref 11–307)

## 2020-04-24 MED ORDER — ENSURE ENLIVE PO LIQD
237.0000 mL | Freq: Three times a day (TID) | ORAL | Status: DC
  Administered 2020-04-24 – 2020-04-25 (×2): 237 mL via ORAL

## 2020-04-24 NOTE — Progress Notes (Signed)
Initial Nutrition Assessment  DOCUMENTATION CODES:   Not applicable  INTERVENTION:  Ensure Enlive po TID, each supplement provides 350 kcal and 20 grams of protein  Magic cup TID with meals, each supplement provides 290 kcal and 9 grams of protein   NUTRITION DIAGNOSIS:   Increased nutrient needs related to acute illness (pneumonia due to COVID-19 virus) as evidenced by estimated needs.    GOAL:   Patient will meet greater than or equal to 90% of their needs    MONITOR:   Labs,I & O's,Supplement acceptance,PO intake,Weight trends  REASON FOR ASSESSMENT:   Malnutrition Screening Tool    ASSESSMENT: 54 year old female with history significant of COPD, HLD, HTN, hearing loss in right ear, chronic back/neck pain, and bipolar 1 disorder presented with progressive malaise, nausea, vomiting, subjective fevers, chills, cough, and shortness of breath over the last week. Patient admitted for pneumonia due to COVID-19 virus.  RD working remotely.  Attempted to contact pt via phone, however no answer and unable to obtain nutrition history at this time. Per notes, pt on room air, reports feeling a little bit better today. She is eating well, 100% x 2 documented meals today. Will continue to monitor po intake and will order Ensure and Magic Cup supplements to aid with meeting increased needs.  Weight history reviewed, stable over the last 11 months.  Medications reviewed and include: Vit C, Methylprednisolone, Risperdal, Zinc sulfate, Zithromax, Rocephin  Labs: Mg 2.5 (H), CRP 11.2 (H), WBC 2.5 (L)  Per notes: -CXR with lobar pna rather than atypical viral infection, monitor -continue ceftriaxone, zithromax  NUTRITION - FOCUSED PHYSICAL EXAM: Unable to complete   Diet Order:   Diet Order            Diet regular Room service appropriate? Yes; Fluid consistency: Thin  Diet effective now                 EDUCATION NEEDS:   Not appropriate for education at this  time  Skin:  Skin Assessment: Reviewed RN Assessment  Last BM:  11/18 type 4; type 7  Height:   Ht Readings from Last 1 Encounters:  04/23/20 5\' 6"  (1.676 m)    Weight:   Wt Readings from Last 1 Encounters:  04/23/20 76.1 kg    BMI:  Body mass index is 27.08 kg/m.  Estimated Nutritional Needs:   Kcal:  04/25/20  Protein:  114-129  Fluid:  >2 L   [phone], RD, LDN Clinical Nutrition After Hours/Weekend Pager # in Amion

## 2020-04-24 NOTE — Progress Notes (Signed)
PROGRESS NOTE  Barbara Alvarez TKZ:[id number] DOB: 09/25/1965 DOA: 04/23/2020 PCP: Leilani Able, MD   LOS: 1 day   Brief Narrative / Interim history: 54 year old female with COPD, HTN, HLD, bipolar disorder who came into the hospital with general malaise, fever and chills, cough and shortness of breath.  Symptoms started on Monday but has gotten worse.  She also has some nausea and vomiting.  She tested positive for COVID-19.  Chest x-ray showed lobar pneumonia, she was quite tachycardic and tachypneic with walking and was admitted and placed on antibiotics  Subjective / 24h Interval events: Feeling a little bit better, has not been up yet.  Assessment & Plan:  Principal Problem Covid-19 Viral Illness -Patient is on room air, chest x-ray with lobar pneumonia rather than atypical viral infection.  Monitor for now    COVID-19 Labs  Recent Labs    04/23/20 1207 04/23/20 1532 04/24/20 0326  DDIMER  --  0.83* 0.73*  FERRITIN 696*  --  693*  LDH  --  222*  --   CRP 10.8*  --  11.2*    Lab Results  Component Value Date   SARSCOV2NAA POSITIVE (A) 04/23/2020   SARSCOV2NAA NEGATIVE 03/04/2019   Active Problems Lobar pneumonia -Possibly bacterial estimates not in a pattern consistent with viral infection -started on ceftriaxone and azithromycin, continue  Scheduled Meds: . albuterol  2 puff Inhalation Q6H  . vitamin C  500 mg Oral Daily  . enoxaparin (LOVENOX) injection  40 mg Subcutaneous Q24H  . methylPREDNISolone (SOLU-MEDROL) injection  1 mg/kg Intravenous Q12H   Followed by  . [START ON 04/27/2020] predniSONE  50 mg Oral Daily  . risperiDONE  0.5 mg Oral BID  . sodium chloride flush  3 mL Intravenous Q12H  . sodium chloride flush  3 mL Intravenous Q12H  . zinc sulfate  220 mg Oral Daily   Continuous Infusions: . sodium chloride    . azithromycin 500 mg (04/24/20 0007)  . cefTRIAXone (ROCEPHIN)  IV 1 g (04/23/20 2253)   PRN Meds:.sodium chloride, acetaminophen,  chlorpheniramine-HYDROcodone, guaiFENesin-dextromethorphan, HYDROcodone-acetaminophen, ondansetron **OR** ondansetron (ZOFRAN) IV, polyethylene glycol, sodium chloride flush, tiZANidine, zolpidem  DVT prophylaxis: Lovenox Code Status: Full code Family Communication: d/w patient   Status is: Inpatient  Remains inpatient appropriate because:Inpatient level of care appropriate due to severity of illness   Dispo: The patient is from: Home              Anticipated d/c is to: Home              Anticipated d/c date is: 1 day              Patient currently is not medically stable to d/c.  Consultants:  None   Procedures:  None   Microbiology: None   Antibacterials: Ceftriaxone / Azithromycin   Objective: Vitals:   04/23/20 1906 04/23/20 2313 04/24/20 0321 04/24/20 0837  BP: 100/68 103/62 108/64 116/68  Pulse: 62 60 (!) 54 (!) 57  Resp: 20 20 19 16   Temp: 98.7 F (37.1 C) 99 F (37.2 C) 98.4 F (36.9 C) 98 F (36.7 C)  TempSrc: Oral  Oral Oral  SpO2: 100% 100% 99% 100%  Weight:      Height:        Intake/Output Summary (Last 24 hours) at 04/24/2020 1312 Last data filed at 04/24/2020 0900 Gross per 24 hour  Intake 243 ml  Output --  Net 243 ml   Filed Weights   04/23/20  1202  Weight: 76.1 kg    Examination:  Constitutional: NAD Eyes: no scleral icterus ENMT: Mucous membranes are moist.  Neck: normal, supple Respiratory: Diminished at the bases, no wheezing, no crackles. Normal respiratory effort. No accessory muscle use.  Cardiovascular: Regular rate and rhythm, no murmurs / rubs / gallops. No LE edema. Good peripheral pulses Abdomen: non distended, no tenderness. Bowel sounds positive.  Musculoskeletal: no clubbing / cyanosis.  Skin: no rashes Neurologic: CN 2-12 grossly intact. Strength 5/5 in all 4.    Data Reviewed: I have independently reviewed following labs and imaging studies   CBC: Recent Labs  Lab 04/23/20 1208 04/24/20 0326  WBC 3.6*  2.5*  NEUTROABS 2.0 1.6*  HGB 11.9* 11.4*  HCT 35.2* 34.8*  MCV 85.9 87.4  PLT 234 250   Basic Metabolic Panel: Recent Labs  Lab 04/23/20 1208 04/24/20 0326  NA 137 139  K 2.9* 3.7  CL 103 104  CO2 21* 21*  GLUCOSE 93 123*  BUN 10 11  CREATININE 0.89 0.69  CALCIUM 8.1* 7.9*  MG  --  2.5*  PHOS  --  3.0   GFR: Estimated Creatinine Clearance: 85.7 mL/min (by C-G formula based on SCr of 0.69 mg/dL). Liver Function Tests: Recent Labs  Lab 04/23/20 1208 04/24/20 0326  AST 46* 36  ALT 39 36  ALKPHOS 52 45  BILITOT 1.2 0.6  PROT 7.4 6.9  ALBUMIN 3.6 3.1*   No results for input(s): LIPASE, AMYLASE in the last 168 hours. No results for input(s): AMMONIA in the last 168 hours. Coagulation Profile: No results for input(s): INR, PROTIME in the last 168 hours. Cardiac Enzymes: No results for input(s): CKTOTAL, CKMB, CKMBINDEX, TROPONINI in the last 168 hours. BNP (last 3 results) No results for input(s): PROBNP in the last 8760 hours. HbA1C: No results for input(s): HGBA1C in the last 72 hours. CBG: No results for input(s): GLUCAP in the last 168 hours. Lipid Profile: Recent Labs    04/23/20 1414  TRIG 80   Thyroid Function Tests: No results for input(s): TSH, T4TOTAL, FREET4, T3FREE, THYROIDAB in the last 72 hours. Anemia Panel: Recent Labs    04/23/20 1207 04/24/20 0326  FERRITIN 696* 693*   Urine analysis:    Component Value Date/Time   COLORURINE STRAW (A) 03/05/2019 1655   APPEARANCEUR CLEAR 03/05/2019 1655   LABSPEC 1.002 (L) 03/05/2019 1655   PHURINE 7.0 03/05/2019 1655   GLUCOSEU NEGATIVE 03/05/2019 1655   HGBUR NEGATIVE 03/05/2019 1655   BILIRUBINUR NEGATIVE 03/05/2019 1655   KETONESUR NEGATIVE 03/05/2019 1655   PROTEINUR NEGATIVE 03/05/2019 1655   UROBILINOGEN 0.2 11/20/2016 0918   NITRITE NEGATIVE 03/05/2019 1655   LEUKOCYTESUR NEGATIVE 03/05/2019 1655   Sepsis Labs: Invalid input(s): PROCALCITONIN, LACTICIDVEN  Recent Results (from the  past 240 hour(s))  Resp Panel by RT-PCR (Flu A&B, Covid) Nasopharyngeal Swab     Status: Abnormal   Collection Time: 04/23/20 12:20 PM   Specimen: Nasopharyngeal Swab; Nasopharyngeal(NP) swabs in vial transport medium  Result Value Ref Range Status   SARS Coronavirus 2 by RT PCR POSITIVE (A) NEGATIVE Final    Comment: RESULT CALLED TO, READ BACK BY AND VERIFIED WITH: CLAPP,S. RN @1445  04/23/20 BILLINGSLEY,L (NOTE) SARS-CoV-2 target nucleic acids are DETECTED.  The SARS-CoV-2 RNA is generally detectable in upper respiratory specimens during the acute phase of infection. Positive results are indicative of the presence of the identified virus, but do not rule out bacterial infection or co-infection with other pathogens not detected by  the test. Clinical correlation with patient history and other diagnostic information is necessary to determine patient infection status. The expected result is Negative.  Fact Sheet for Patients: BloggerCourse.com  Fact Sheet for Healthcare Providers: SeriousBroker.it  This test is not yet approved or cleared by the [place] FDA and  has been authorized for detection and/or diagnosis of SARS-CoV-2 by FDA under an Emergency Use Authorization (EUA).  This EUA will remain in effect (meaning this test  can be used) for the duration of  the COVID-19 declaration under Section 564(b)(1) of the Act, 21 U.S.C. section 360bbb-3(b)(1), unless the authorization is terminated or revoked sooner.     Influenza A by PCR NEGATIVE NEGATIVE Final   Influenza B by PCR NEGATIVE NEGATIVE Final    Comment: (NOTE) The Xpert Xpress SARS-CoV-2/FLU/RSV plus assay is intended as an aid in the diagnosis of influenza from Nasopharyngeal swab specimens and should not be used as a sole basis for treatment. Nasal washings and aspirates are unacceptable for Xpert Xpress SARS-CoV-2/FLU/RSV testing.  Fact Sheet for  Patients: BloggerCourse.com  Fact Sheet for Healthcare Providers: SeriousBroker.it  This test is not yet approved or cleared by the [place] FDA and has been authorized for detection and/or diagnosis of SARS-CoV-2 by FDA under an Emergency Use Authorization (EUA). This EUA will remain in effect (meaning this test can be used) for the duration of the COVID-19 declaration under Section 564(b)(1) of the Act, 21 U.S.C. section 360bbb-3(b)(1), unless the authorization is terminated or revoked.  Performed at [facility], [address], [place], [place] [address]       Radiology Studies: [facility] Chest Port 1 View  Result Date: 04/23/2020 CLINICAL DATA:  Cough, chest pain, and shortness of breath. EXAM: PORTABLE CHEST 1 VIEW COMPARISON:  Chest x-ray dated March 06, 2017. FINDINGS: The patient is rotated to the left. The heart size and mediastinal contours are within normal limits. Normal pulmonary vascularity. Patchy consolidation in the left upper lobe. No pleural effusion or pneumothorax. No acute osseous abnormality. IMPRESSION: 1. Left upper lobe pneumonia. Electronically Signed   By: Obie Dredge M.D.   On: 04/23/2020 12:41    Pamella Pert, MD, PhD [facility]  Between 7 am - 7 pm I am available, please contact me via Amion or Securechat  Between 7 pm - 7 am I am not available, please contact night coverage MD/APP via Amion

## 2020-04-25 DIAGNOSIS — E785 Hyperlipidemia, unspecified: Secondary | ICD-10-CM

## 2020-04-25 MED ORDER — DOXYCYCLINE HYCLATE 100 MG PO CAPS: 100.0000 mg | ORAL_CAPSULE | Freq: Two times a day (BID) | ORAL | 0 refills | Status: AC

## 2020-04-25 MED ORDER — DEXAMETHASONE 6 MG PO TABS: 6.0000 mg | ORAL_TABLET | Freq: Every day | ORAL | 0 refills | Status: AC

## 2020-04-25 MED ORDER — GUAIFENESIN-DM 100-10 MG/5ML PO SYRP: 10.0000 mL | ORAL_SOLUTION | ORAL | 0 refills | Status: DC | PRN

## 2020-04-25 NOTE — Progress Notes (Signed)
Patient discharged via wheelchair accompanied by staff. Patient is alert and oriented, no complaints, vital sign is stable. EVS discussed and pt. instructed and educated. Pt. Verbalized understanding. EVS  And prescription given. All belongings are with the patient.Arranged cab came to pick her up.

## 2020-04-25 NOTE — Discharge Summary (Signed)
Physician Discharge Summary  Barbara Alvarez NWG:[id number] DOB: 03/07/1966 DOA: 04/23/2020  PCP: Leilani Able, MD  Admit date: 04/23/2020 Discharge date: 04/25/2020  Admitted From: home Disposition:  home  Recommendations for Outpatient Follow-up:  1. Follow up with PCP in 1-2 weeks  Home Health: none Equipment/Devices: none  Discharge Condition: stable CODE STATUS: Full code Diet recommendation: regular  HPI: Per admitting MD, Barbara Alvarez is a 54 y.o. female with medical history significant of COPD, HLD, HTN, bipolar disorder who presents to the emergency department with chief complaint of general malaise, subjective fevers and chills, cough and shortness of breath.  She states that her symptoms started Monday and has progressively gotten worse.  She also admits to nausea, vomiting, diarrhea and some abdominal discomfort.  In the emergency department, patient tested positive for COVID-19.  Upon ambulation, her oxygen saturation maintained at 98%, however she became tachycardic and tachypneic and symptomatic.  TRH consulted for admission.  Hospital Course / Discharge diagnoses: Principal problem COVID-19 viral illness -patient admitted to the hospital with COVID-19 viral illness, tachypnea, subjective shortness of breath without true hypoxia, she was placed on steroids on admission.  She responded well to treatment, remains on room air, shortness of breath and tachypnea have resolved completely and she is able to ambulate, feeling at baseline, will be discharged home in stable condition to finish Decadron as an outpatient  Active problems Lobar pneumonia-chest x-ray appearance is less likely consistent with viral atypical pneumonia but suggesting a lobar distribution concerning for bacterial etiology.  She was placed on ceftriaxone and azithromycin, improved as above, will be transitioned doxycycline for a total of 5-day course. Bipolar disorder, anxiety-resume home  medications Hyperlipidemia-resume home medications Essential hypertension-resume home medications   Sepsis ruled out    Discharge Instructions   Allergies as of 04/25/2020      Reactions   Haloperidol Decanoate Anaphylaxis   Ibuprofen Shortness Of Breath   Tramadol Swelling   Flexeril [cyclobenzaprine] Other (See Comments)   Unknown   Naproxen Other (See Comments)   Nasal Drainage      Medication List    TAKE these medications   acetaminophen-codeine 300-30 MG tablet Commonly known as: TYLENOL #3 Take 1-2 tablets by mouth every 8 (eight) hours as needed for moderate pain.   albuterol (2.5 MG/3ML) 0.083% nebulizer solution Commonly known as: PROVENTIL Take 3 mLs (2.5 mg total) by nebulization every 6 (six) hours as needed for wheezing or shortness of breath.   albuterol 108 (90 Base) MCG/ACT inhaler Commonly known as: VENTOLIN HFA Inhale 2 puffs into the lungs every 6 (six) hours as needed for wheezing or shortness of breath.   atorvastatin 40 MG tablet Commonly known as: LIPITOR Take 1 tablet (40 mg total) daily by mouth.   carbamazepine 200 MG Cp12 12 hr capsule Commonly known as: Equetro Take 1 capsule (200 mg total) by mouth at bedtime.   cetirizine 10 MG tablet Commonly known as: ZYRTEC Take 1 tablet (10 mg total) by mouth daily.   dexamethasone 6 MG tablet Commonly known as: DECADRON Take 1 tablet (6 mg total) by mouth daily for 7 days.   diazepam 5 MG tablet Commonly known as: VALIUM Take 5 mg at MRI scanner and may repeat one time after 30 minutes. What changed:   how much to take  how to take this  when to take this  reasons to take this  additional instructions   doxycycline 100 MG capsule Commonly known as: VIBRAMYCIN Take 1  capsule (100 mg total) by mouth 2 (two) times daily for 3 days.   fluticasone 50 MCG/ACT nasal spray Commonly known as: FLONASE Place 2 sprays into both nostrils daily.   guaiFENesin-dextromethorphan 100-10  MG/5ML syrup Commonly known as: ROBITUSSIN DM Take 10 mLs by mouth every 4 (four) hours as needed for cough.   hydrOXYzine 25 MG tablet Commonly known as: ATARAX/VISTARIL Take 1-2 tablets (25-50 mg total) by mouth every 6 (six) hours as needed for itching.   ibuprofen 200 MG tablet Commonly known as: ADVIL Take 200 mg by mouth every 6 (six) hours as needed for moderate pain.   lidocaine 2 % solution Commonly known as: XYLOCAINE Use as directed 15 mLs in the mouth or throat as needed for mouth pain. What changed: when to take this   lidocaine 5 % Commonly known as: Lidoderm Place 1 patch onto the skin daily. Remove & Discard patch within 12 hours or as directed by MD What changed: additional instructions   lisinopril 20 MG tablet Commonly known as: ZESTRIL Take 1 tablet (20 mg total) by mouth daily.   methocarbamol 500 MG tablet Commonly known as: ROBAXIN Take 1 tablet (500 mg total) by mouth 2 (two) times daily.   polyethylene glycol 17 g packet Commonly known as: MIRALAX / GLYCOLAX Take 17 g by mouth daily as needed for mild constipation.   pregabalin 75 MG capsule Commonly known as: LYRICA Take 1 capsule (75 mg total) by mouth 2 (two) times daily.   risperiDONE 0.5 MG tablet Commonly known as: RISPERDAL Take 0.5 mg by mouth 2 (two) times daily.   tiZANidine 4 MG tablet Commonly known as: Zanaflex Take 1 tablet (4 mg total) by mouth every 6 (six) hours as needed for muscle spasms.        Consultations:  None   Procedures/Studies:  DG Chest Port 1 View  Result Date: 04/23/2020 CLINICAL DATA:  Cough, chest pain, and shortness of breath. EXAM: PORTABLE CHEST 1 VIEW COMPARISON:  Chest x-ray dated March 06, 2017. FINDINGS: The patient is rotated to the left. The heart size and mediastinal contours are within normal limits. Normal pulmonary vascularity. Patchy consolidation in the left upper lobe. No pleural effusion or pneumothorax. No acute osseous  abnormality. IMPRESSION: 1. Left upper lobe pneumonia. Electronically Signed   By: Obie DredgeWilliam T Derry M.D.   On: 04/23/2020 12:41      Subjective: - no chest pain, shortness of breath, no abdominal pain, nausea or vomiting.   Discharge Exam: BP 109/64 (BP Location: Right Arm)   Pulse 61   Temp 98.5 F (36.9 C) (Oral)   Resp 20   Ht 5\' 6"  (1.676 m)   Wt 76.1 kg   LMP 09/06/2016   SpO2 99%   BMI 27.08 kg/m   General: Pt is alert, awake, not in acute distress Cardiovascular: RRR, S1/S2 +, no rubs, no gallops Respiratory: CTA bilaterally, no wheezing, no rhonchi Abdominal: Soft, NT, ND, bowel sounds + Extremities: no edema, no cyanosis    The results of significant diagnostics from this hospitalization (including imaging, microbiology, ancillary and laboratory) are listed below for reference.     Microbiology: Recent Results (from the past 240 hour(s))  Resp Panel by RT-PCR (Flu A&B, Covid) Nasopharyngeal Swab     Status: Abnormal   Collection Time: 04/23/20 12:20 PM   Specimen: Nasopharyngeal Swab; Nasopharyngeal(NP) swabs in vial transport medium  Result Value Ref Range Status   SARS Coronavirus 2 by RT PCR POSITIVE (A) NEGATIVE Final  Comment: RESULT CALLED TO, READ BACK BY AND VERIFIED WITH: CLAPP,S. RN @1445  04/23/20 BILLINGSLEY,L (NOTE) SARS-CoV-2 target nucleic acids are DETECTED.  The SARS-CoV-2 RNA is generally detectable in upper respiratory specimens during the acute phase of infection. Positive results are indicative of the presence of the identified virus, but do not rule out bacterial infection or co-infection with other pathogens not detected by the test. Clinical correlation with patient history and other diagnostic information is necessary to determine patient infection status. The expected result is Negative.  Fact Sheet for Patients: 04/25/20  Fact Sheet for Healthcare  Providers: BloggerCourse.com  This test is not yet approved or cleared by the [employer]ious[employer] FDA and  has been authorized for detection and/or diagnosis of SARS-CoV-2 by FDA under an Emergency Use Authorization (EUA).  This EUA will remain in effect (meaning this test  can be used) for the duration of  the COVID-19 declaration under Section 564(b)(1) of the Act, 21 U.S.C. section 360bbb-3(b)(1), unless the authorization is terminated or revoked sooner.     Influenza A by PCR NEGATIVE NEGATIVE Final   Influenza B by PCR NEGATIVE NEGATIVE Final    Comment: (NOTE) The Xpert Xpress SARS-CoV-2/FLU/RSV plus assay is intended as an aid in the diagnosis of influenza from Nasopharyngeal swab specimens and should not be used as a sole basis for treatment. Nasal washings and aspirates are unacceptable for Xpert Xpress SARS-CoV-2/FLU/RSV testing.  Fact Sheet for Patients: [place]  Fact Sheet for Healthcare Providers: BloggerCourse.com  This test is not yet approved or cleared by the Serious[employer]ker.it FDA and has been authorized for detection and/or diagnosis of SARS-CoV-2 by FDA under an Emergency Use Authorization (EUA). This EUA will remain in effect (meaning this test can be used) for the duration of the COVID-19 declaration under Section 564(b)(1) of the Act, 21 U.S.C. section 360bbb-3(b)(1), unless the authorization is terminated or revoked.  Performed at [facility], [address] [place], [place]   Blood Culture (routine x 2)     Status: None (Preliminary result)   Collection Time: 04/23/20  3:32 PM   Specimen: BLOOD LEFT FOREARM  Result Value Ref Range Status   Specimen Description   Final    BLOOD LEFT FOREARM Performed at [facility], [address], [place], [place]    Special Requests   Final    BOTTLES DRAWN AEROBIC AND ANAEROBIC  Blood Culture adequate volume Performed at [facility], [address] [place], [place]    Culture   Final    NO GROWTH < 24 HOURS Performed at [facility], [address] [place], [place]    Report Status PENDING  Incomplete     Labs: Basic Metabolic Panel: Recent Labs  Lab 04/23/20 1208 04/24/20 0326  NA 137 139  K 2.9* 3.7  CL 103 104  CO2 21* 21*  GLUCOSE 93 123*  BUN 10 11  CREATININE 0.89 0.69  CALCIUM 8.1* 7.9*  MG  --  2.5*  PHOS  --  3.0   Liver Function Tests: Recent Labs  Lab 04/23/20 1208 04/24/20 0326  AST 46* 36  ALT 39 36  ALKPHOS 52 45  BILITOT 1.2 0.6  PROT 7.4 6.9  ALBUMIN 3.6 3.1*   CBC: Recent Labs  Lab 04/23/20 1208 04/24/20 0326  WBC 3.6* 2.5*  NEUTROABS 2.0 1.6*  HGB 11.9* 11.4*  HCT 35.2* 34.8*  MCV 85.9 87.4  PLT 234 250  CBG: No results for input(s): GLUCAP in the last 168 hours. Hgb A1c No results for input(s): HGBA1C in the last 72 hours. Lipid Profile Recent Labs    04/23/20 1414  TRIG 80   Thyroid function studies No results for input(s): TSH, T4TOTAL, T3FREE, THYROIDAB in the last 72 hours.  Invalid input(s): FREET3 Urinalysis    Component Value Date/Time   COLORURINE STRAW (A) 03/05/2019 1655   APPEARANCEUR CLEAR 03/05/2019 1655   LABSPEC 1.002 (L) 03/05/2019 1655   PHURINE 7.0 03/05/2019 1655   GLUCOSEU NEGATIVE 03/05/2019 1655   HGBUR NEGATIVE 03/05/2019 1655   BILIRUBINUR NEGATIVE 03/05/2019 1655   KETONESUR NEGATIVE 03/05/2019 1655   PROTEINUR NEGATIVE 03/05/2019 1655   UROBILINOGEN 0.2 11/20/2016 0918   NITRITE NEGATIVE 03/05/2019 1655   LEUKOCYTESUR NEGATIVE 03/05/2019 1655    FURTHER DISCHARGE INSTRUCTIONS:   Get Medicines reviewed and adjusted: Please take all your medications with you for your next visit with your Primary MD   Laboratory/radiological data: Please request your Primary MD to go over all hospital tests and  procedure/radiological results at the follow up, please ask your Primary MD to get all Hospital records sent to his/her office.   In some cases, they will be blood work, cultures and biopsy results pending at the time of your discharge. Please request that your primary care M.D. goes through all the records of your hospital data and follows up on these results.   Also Note the following: If you experience worsening of your admission symptoms, develop shortness of breath, life threatening emergency, suicidal or homicidal thoughts you must seek medical attention immediately by calling 911 or calling your MD immediately  if symptoms less severe.   You must read complete instructions/literature along with all the possible adverse reactions/side effects for all the Medicines you take and that have been prescribed to you. Take any new Medicines after you have completely understood and accpet all the possible adverse reactions/side effects.    Do not drive when taking Pain medications or sleeping medications (Benzodaizepines)   Do not take more than prescribed Pain, Sleep and Anxiety Medications. It is not advisable to combine anxiety,sleep and pain medications without talking with your primary care practitioner   Special Instructions: If you have smoked or chewed Tobacco  in the last 2 yrs please stop smoking, stop any regular Alcohol  and or any Recreational drug use.   Wear Seat belts while driving.   Please note: You were cared for by a hospitalist during your hospital stay. Once you are discharged, your primary care physician will handle any further medical issues. Please note that NO REFILLS for any discharge medications will be authorized once you are discharged, as it is imperative that you return to your primary care physician (or establish a relationship with a primary care physician if you do not have one) for your post hospital discharge needs so that they can reassess your need for  medications and monitor your lab values.  Time coordinating discharge: 35 minutes  SIGNED:  Pamella Pert, MD, PhD 04/25/2020, 9:39 AM

## 2020-04-25 NOTE — Plan of Care (Signed)

## 2020-04-29 LAB — CULTURE, BLOOD (ROUTINE X 2)
Culture: NO GROWTH
Special Requests: ADEQUATE

## 2020-05-03 ENCOUNTER — Other Ambulatory Visit: Payer: Self-pay

## 2020-05-03 ENCOUNTER — Ambulatory Visit (HOSPITAL_COMMUNITY)
Admission: EM | Admit: 2020-05-03 | Discharge: 2020-05-03 | Disposition: A | Discharge status: Home / Self Care | Payer: Medicaid Other | Attending: Family Medicine | Admitting: Family Medicine

## 2020-05-03 ENCOUNTER — Encounter (HOSPITAL_COMMUNITY): Payer: Self-pay

## 2020-05-03 DIAGNOSIS — Z20822 Contact with and (suspected) exposure to covid-19: Secondary | ICD-10-CM | POA: Diagnosis present

## 2020-05-03 DIAGNOSIS — U071 COVID-19: Secondary | ICD-10-CM | POA: Diagnosis not present

## 2020-05-03 LAB — SARS CORONAVIRUS 2 (TAT 6-24 HRS): SARS Coronavirus 2: POSITIVE — AB

## 2020-05-03 NOTE — ED Triage Notes (Signed)
Pt in requesting covid testing so she can return to work.  Pt educated on the possibility of test results still being positive.   Denies any uri sxs

## 2020-05-09 ENCOUNTER — Other Ambulatory Visit: Payer: Self-pay

## 2020-05-09 ENCOUNTER — Emergency Department (HOSPITAL_COMMUNITY)
Admission: EM | Admit: 2020-05-09 | Discharge: 2020-05-10 | Disposition: A | Discharge status: Home / Self Care | Payer: Medicaid Other | Attending: Emergency Medicine | Admitting: Emergency Medicine

## 2020-05-09 ENCOUNTER — Ambulatory Visit (HOSPITAL_COMMUNITY)
Admission: EM | Admit: 2020-05-09 | Discharge: 2020-05-09 | Disposition: A | Discharge status: Other Acute Inpatient Hospital | Payer: Medicaid Other | Attending: Emergency Medicine | Admitting: Emergency Medicine

## 2020-05-09 ENCOUNTER — Encounter (HOSPITAL_COMMUNITY): Payer: Self-pay | Admitting: Emergency Medicine

## 2020-05-09 ENCOUNTER — Encounter (HOSPITAL_COMMUNITY): Payer: Self-pay

## 2020-05-09 DIAGNOSIS — F17210 Nicotine dependence, cigarettes, uncomplicated: Secondary | ICD-10-CM | POA: Insufficient documentation

## 2020-05-09 DIAGNOSIS — Z8616 Personal history of COVID-19: Secondary | ICD-10-CM | POA: Diagnosis not present

## 2020-05-09 DIAGNOSIS — H5713 Ocular pain, bilateral: Secondary | ICD-10-CM | POA: Diagnosis not present

## 2020-05-09 DIAGNOSIS — Z79899 Other long term (current) drug therapy: Secondary | ICD-10-CM | POA: Insufficient documentation

## 2020-05-09 DIAGNOSIS — I10 Essential (primary) hypertension: Secondary | ICD-10-CM | POA: Diagnosis not present

## 2020-05-09 DIAGNOSIS — H5789 Other specified disorders of eye and adnexa: Secondary | ICD-10-CM | POA: Diagnosis not present

## 2020-05-09 MED ORDER — FLUORESCEIN SODIUM 1 MG OP STRP
ORAL_STRIP | OPHTHALMIC | Status: AC
  Filled 2020-05-09: qty 2

## 2020-05-09 MED ORDER — TETRACAINE HCL 0.5 % OP SOLN
OPHTHALMIC | Status: AC
  Filled 2020-05-09: qty 4

## 2020-05-09 NOTE — Discharge Instructions (Signed)
With the degree of pain in your eyes, the redness, blurry vision, and tearing I am concerned that you have something more in-depth going on with your eye than I can determine here at the urgent care.  As we discussed, there was no foreign body or corneal abrasion noted on exam.  I am recommending that she go to the emergency department for an ophthalmology evaluation.

## 2020-05-09 NOTE — ED Triage Notes (Signed)
Pt sent from [facility] for pain, redness, copious clear discharge, blurred vision, and light sensitivity to bilateral eyes since yesterday.  No known chemical or welding exposure.

## 2020-05-09 NOTE — ED Notes (Signed)
Report called to triage nurse at [facility]

## 2020-05-09 NOTE — ED Provider Notes (Signed)
[facility]    CSN: [id number] Arrival date & time: 05/09/20  1332      History   Chief Complaint Chief Complaint  Patient presents with  . Eye Pain    biIlaterally burning    HPI Barbara Alvarez is a 55 y.o. female.   HPI   -year-old female here for evaluation of bilateral eye burning and tearing.  Patient reports that her symptoms started yesterday.  She treated herself with cold rag Benadryl and put milk in her eyes yesterday.  She states that her discharge from her eye is copious and clear.  She is complaining of blurry vision and light sensitivity.  Patient works at a [employer] but she denies any kind of known chemical exposure or being around welding.  Past Medical History:  Diagnosis Date  . Bipolar 1 disorder (HCC)   . Bronchitis   . Chronic back pain   . Chronic dental pain   . Chronic neck pain   . Chronic pain in left foot    since great toe fx  . Claustrophobia   . Headache   . Hearing loss in right ear   . Hypertension   . Shoulder pain     Patient Active Problem List   Diagnosis Date Noted  . Pneumonia due to COVID-19 virus 04/23/2020  . Hyperlipidemia 04/23/2020  . Hypokalemia 04/23/2020  . Numbness and tingling of leg   . Pain in both lower legs 03/04/2019  . Cervical spine pain 01/21/2019  . Pain in right finger(s) 11/22/2016  . Finger pain, left 11/22/2016  . Chronic right shoulder pain 11/22/2016  . Essential hypertension 05/30/2016  . Right shoulder injury, initial encounter 05/30/2016  . Seasonal allergic rhinitis 05/30/2016  . Muscle spasm of right shoulder 05/30/2016  . Need for immunization against influenza 05/30/2016  . Bipolar disorder (HCC) 05/30/2016  . Acquired mallet finger of right hand 06/15/2015    Past Surgical History:  Procedure Laterality Date  . CHOLECYSTECTOMY    . LIGAMENT REPAIR     left arm    OB History   No obstetric history on file.      Home Medications    Prior to Admission  medications   Medication Sig Start Date End Date Taking? Authorizing Provider  ibuprofen (ADVIL) 200 MG tablet Take 200 mg by mouth every 6 (six) hours as needed for moderate pain.   Yes [provider]  acetaminophen-codeine (TYLENOL #3) 300-30 MG tablet Take 1-2 tablets by mouth every 8 (eight) hours as needed for moderate pain. 03/18/20   Kerrin Champagne, MD  albuterol (PROVENTIL HFA;VENTOLIN HFA) 108 (90 Base) MCG/ACT inhaler Inhale 2 puffs into the lungs every 6 (six) hours as needed for wheezing or shortness of breath. 05/30/16   Hedges, Tinnie Gens, PA-C  albuterol (PROVENTIL) (2.5 MG/3ML) 0.083% nebulizer solution Take 3 mLs (2.5 mg total) by nebulization every 6 (six) hours as needed for wheezing or shortness of breath. 05/30/16   Hedges, Tinnie Gens, PA-C  atorvastatin (LIPITOR) 40 MG tablet Take 1 tablet (40 mg total) daily by mouth. Patient not taking: Reported on 04/23/2020 03/14/17   Bing Neighbors, FNP  carbamazepine (EQUETRO) 200 MG CP12 12 hr capsule Take 1 capsule (200 mg total) by mouth at bedtime. Patient not taking: Reported on 04/23/2020 09/07/14   Marlon Pel, PA-C  cetirizine (ZYRTEC) 10 MG tablet Take 1 tablet (10 mg total) by mouth daily. 12/17/17   Tilden Fossa, MD  diazepam (VALIUM) 5 MG tablet  Take 5 mg at MRI scanner and may repeat one time after 30 minutes. Patient taking differently: Take 5 mg by mouth daily as needed for anxiety. 03/18/20   Kerrin Champagne, MD  fluticasone (FLONASE) 50 MCG/ACT nasal spray Place 2 sprays into both nostrils daily. Patient not taking: No sig reported 01/10/20   Joy, Shawn C, PA-C  guaiFENesin-dextromethorphan (ROBITUSSIN DM) 100-10 MG/5ML syrup Take 10 mLs by mouth every 4 (four) hours as needed for cough. 04/25/20   Leatha Gilding, MD  hydrOXYzine (ATARAX/VISTARIL) 25 MG tablet Take 1-2 tablets (25-50 mg total) by mouth every 6 (six) hours as needed for itching. Patient not taking: Reported on 04/23/2020 09/20/16   Bing Neighbors, FNP  lidocaine (LIDODERM) 5 % Place 1 patch onto the skin daily. Remove & Discard patch within 12 hours or as directed by MD Patient taking differently: Place 1 patch onto the skin daily. 01/10/20   Joy, Shawn C, PA-C  lidocaine (XYLOCAINE) 2 % solution Use as directed 15 mLs in the mouth or throat as needed for mouth pain. Patient taking differently: Use as directed 15 mLs in the mouth or throat daily as needed for mouth pain. 01/10/20   Joy, Shawn C, PA-C  lisinopril (PRINIVIL,ZESTRIL) 20 MG tablet Take 1 tablet (20 mg total) by mouth daily. Patient not taking: Reported on 04/23/2020 11/20/16   Bing Neighbors, FNP  methocarbamol (ROBAXIN) 500 MG tablet Take 1 tablet (500 mg total) by mouth 2 (two) times daily. Patient not taking: No sig reported 01/10/20   Joy, Shawn C, PA-C  polyethylene glycol (MIRALAX / GLYCOLAX) 17 g packet Take 17 g by mouth daily as needed for mild constipation. 03/06/19   Zannie Cove, MD  pregabalin (LYRICA) 75 MG capsule Take 1 capsule (75 mg total) by mouth 2 (two) times daily. Patient not taking: No sig reported 08/21/19   Kerrin Champagne, MD  risperiDONE (RISPERDAL) 0.5 MG tablet Take 0.5 mg by mouth 2 (two) times daily.     [provider]  tiZANidine (ZANAFLEX) 4 MG tablet Take 1 tablet (4 mg total) by mouth every 6 (six) hours as needed for muscle spasms. 08/16/17   Bethel Born, PA-C    Family History Family History  Problem Relation Age of Onset  . Cancer Mother   . Hypertension Father   . Diabetes Other     Social History Social History   Tobacco Use  . Smoking status: Current Every Day Smoker    Packs/day: 0.25    Types: Cigarettes  . Smokeless tobacco: Never Used  Vaping Use  . Vaping Use: Never used  Substance Use Topics  . Alcohol use: Yes    Comment: hasn't had drink in about 1 wk  . Drug use: No     Allergies   Haloperidol decanoate, Ibuprofen, Tramadol, Flexeril [cyclobenzaprine], and Naproxen   Review of  Systems Review of Systems  Eyes: Positive for photophobia, pain, discharge, redness and visual disturbance.  Skin: Negative for rash.  Hematological: Negative.   Psychiatric/Behavioral: Negative.      Physical Exam Triage Vital Signs ED Triage Vitals  Enc Vitals Group     BP 05/09/20 1534 (!) 137/58     Pulse Rate 05/09/20 1534 60     Resp 05/09/20 1534 18     Temp 05/09/20 1534 98.5 F (36.9 C)     Temp Source 05/09/20 1534 Oral     SpO2 05/09/20 1534 100 %  Weight --      Height --      Head Circumference --      Peak Flow --      Pain Score 05/09/20 1537 0     Pain Loc --      Pain Edu? --      Excl. in [place]? --    No data found.  Updated Vital Signs BP (!) 137/58 (BP Location: Right Arm)   Pulse 60   Temp 98.5 F (36.9 C) (Oral)   Resp 18   LMP 09/06/2016   SpO2 100%   Visual Acuity Right Eye Distance: 20/70 (Without correction ) Left Eye Distance: 20/70 (Without correction ) Bilateral Distance: 20/50 (Without correction )  Right Eye Near:   Left Eye Near:    Bilateral Near:     Physical Exam Vitals and nursing note reviewed.  Constitutional:      Appearance: Normal appearance. She is ill-appearing.  HENT:     Head: Normocephalic and atraumatic.  Eyes:     General:        Right eye: Discharge present.        Left eye: Discharge present.    Extraocular Movements: Extraocular movements intact.  Skin:    General: Skin is warm and dry.     Capillary Refill: Capillary refill takes less than 2 seconds.     Findings: No erythema or rash.  Neurological:     General: No focal deficit present.     Mental Status: She is alert and oriented to person, place, and time.  Psychiatric:        Mood and Affect: Mood normal.        Behavior: Behavior normal.        Thought Content: Thought content normal.        Judgment: Judgment normal.      UC Treatments / Results  Labs (all labs ordered are listed, but only abnormal results are displayed) Labs  Reviewed - No data to display  EKG   Radiology No results found.  Procedures Procedures (including critical care time)  Medications Ordered in UC Medications - No data to display  Initial Impression / Assessment and Plan / UC Course  I have reviewed the triage vital signs and the nursing notes.  Pertinent labs & imaging results that were available during my care of the patient were reviewed by me and considered in my medical decision making (see chart for details).   Patient is here for evaluation of bilateral burning eye pain with copious tearing that started yesterday.  Patient's eyes are red and injected.  She has copious tearing and marked photosensitivity.  There is no purulent discharge noted.  Woods lamp exam completed using fluorescein dye and tetracaine.  The tetracaine did not improve patient's symptoms.  No corneal abrasion or foreign bodies are noted.  Given patient's severe symptoms, visual disturbance, and duration of symptoms I am referring patient to the ER for an ophthalmology evaluation.   Final Clinical Impressions(s) / UC Diagnoses   Final diagnoses:  Pain of both eyes     Discharge Instructions     With the degree of pain in your eyes, the redness, blurry vision, and tearing I am concerned that you have something more in-depth going on with your eye than I can determine here at the urgent care.  As we discussed, there was no foreign body or corneal abrasion noted on exam.  I am recommending that she go to the  emergency department for an ophthalmology evaluation.    ED Prescriptions    None     PDMP not reviewed this encounter.   Becky Augusta, NP 05/09/20 1652

## 2020-05-09 NOTE — ED Triage Notes (Signed)
Patient states her eyes a re burning and watering bilaterally. Pt is aox4 and ambulatory.

## 2020-05-10 MED ORDER — FLUORESCEIN SODIUM 1 MG OP STRP
1.0000 | ORAL_STRIP | Freq: Once | OPHTHALMIC | Status: AC
  Administered 2020-05-10: 1 via OPHTHALMIC
  Filled 2020-05-10 (×2): qty 1

## 2020-05-10 MED ORDER — TETRACAINE HCL 0.5 % OP SOLN
1.0000 [drp] | Freq: Once | OPHTHALMIC | Status: AC
  Administered 2020-05-10: 1 [drp] via OPHTHALMIC
  Filled 2020-05-10: qty 4

## 2020-05-10 NOTE — Discharge Instructions (Signed)
[facility] eye care [address], [place], [place] [address] [phone]

## 2020-05-10 NOTE — Progress Notes (Signed)
..   Transition of Care [facility]) - Emergency Department Mini Assessment   Patient Details  Name: Barbara Alvarez MRN: [id number] Date of Birth: 1965-06-07  Transition of Care [facility]) CM/SW Contact:    Galia Rahm C Tarpley-Carter, LCSWA Phone Number: 05/10/2020, 11:10 AM   Clinical Narrative: [facility] CM/CSW consulted with pt about need for transportation to eye appointment and then home.    CSW will continue to follow for dc needs.  Alton Bouknight Tarpley-Carter, MSW, LCSW-A Pronouns:  She, Her, Hers                  [facility] ED Transitions of CareClinical Social Worker [email or web address] [phone]   ED Mini Assessment: What brought you to the Emergency Department? : Eye Pain  Barriers to Discharge: No Barriers Identified  Barrier interventions: Pt can't see.  There will be a wait on transportation for someone that can carry her from door to door.  Means of departure: Public Transportation  Interventions which prevented an admission or readmission: Transportation Screening    Patient Contact and Communications        ,          Patient states their goals for this hospitalization and ongoing recovery are:: Transportation to [facility], then home.   Choice offered to / list presented to : Patient  Admission diagnosis:  eye pain Patient Active Problem List   Diagnosis Date Noted  . Pneumonia due to COVID-19 virus 04/23/2020  . Hyperlipidemia 04/23/2020  . Hypokalemia 04/23/2020  . Numbness and tingling of leg   . Pain in both lower legs 03/04/2019  . Cervical spine pain 01/21/2019  . Pain in right finger(s) 11/22/2016  . Finger pain, left 11/22/2016  . Chronic right shoulder pain 11/22/2016  . Essential hypertension 05/30/2016  . Right shoulder injury, initial encounter 05/30/2016  . Seasonal allergic rhinitis 05/30/2016  . Muscle spasm of right shoulder 05/30/2016  . Need for immunization against influenza 05/30/2016  . Bipolar  disorder (HCC) 05/30/2016  . Acquired mallet finger of right hand 06/15/2015   PCP:  Leilani Able, MD Pharmacy:   [facility] AT [place], [place] - [address] [place] [address] Phone: [phone] Fax: [phone]  [facility] [phone] [place], [place] - [address] AT [facility] [address] [place] [address] Phone: [phone] Fax: [phone]

## 2020-05-10 NOTE — Progress Notes (Signed)
TOC CM/CSW consulted with pt in regards to signing "Rider's Waiver".  Pt gave CSW verbal consent to sign "Rider's Waiver".  Transportation was called.  "Rider's Waiver"  Was faxed to transportation.  CSW will continue to follow for dc needs.  Barbara Alvarez, MSW, LCSW-A Pronouns:  She, Her, Hers                  Barbara Alvarez ED Transitions of CareClinical Social Worker [email or web address] [phone]

## 2020-05-10 NOTE — ED Provider Notes (Signed)
[facility] EMERGENCY DEPARTMENT Provider Note   CSN: [id number] Arrival date & time: 05/09/20  1702     History Chief Complaint  Patient presents with  . Eye Pain    Barbara Alvarez is a 55 y.o. female.   Eye Problem Location:  Both eyes Quality:  Aching Severity:  Severe Onset quality:  Gradual Timing:  Constant Progression:  Worsening Chronicity:  New Context comment:  Works at [employer] no specific exposure but had this happen a year ago.  Patient says this is also by time of year for allergies. Relieved by: tetracain drops at [facility]. Worsened by:  Bright light Ineffective treatments:  None tried Associated symptoms: blurred vision, discharge, photophobia and redness   Associated symptoms: no headaches, no nausea and no vomiting        Past Medical History:  Diagnosis Date  . Bipolar 1 disorder (HCC)   . Bronchitis   . Chronic back pain   . Chronic dental pain   . Chronic neck pain   . Chronic pain in left foot    since great toe fx  . Claustrophobia   . Headache   . Hearing loss in right ear   . Hypertension   . Shoulder pain     Patient Active Problem List   Diagnosis Date Noted  . Pneumonia due to COVID-19 virus 04/23/2020  . Hyperlipidemia 04/23/2020  . Hypokalemia 04/23/2020  . Numbness and tingling of leg   . Pain in both lower legs 03/04/2019  . Cervical spine pain 01/21/2019  . Pain in right finger(s) 11/22/2016  . Finger pain, left 11/22/2016  . Chronic right shoulder pain 11/22/2016  . Essential hypertension 05/30/2016  . Right shoulder injury, initial encounter 05/30/2016  . Seasonal allergic rhinitis 05/30/2016  . Muscle spasm of right shoulder 05/30/2016  . Need for immunization against influenza 05/30/2016  . Bipolar disorder (HCC) 05/30/2016  . Acquired mallet finger of right hand 06/15/2015    Past Surgical History:  Procedure Laterality Date  . CHOLECYSTECTOMY    . LIGAMENT REPAIR     left arm     OB  History   No obstetric history on file.     Family History  Problem Relation Age of Onset  . Cancer Mother   . Hypertension Father   . Diabetes Other     Social History   Tobacco Use  . Smoking status: Current Every Day Smoker    Packs/day: 0.25    Types: Cigarettes  . Smokeless tobacco: Never Used  Vaping Use  . Vaping Use: Never used  Substance Use Topics  . Alcohol use: Yes    Comment: hasn't had drink in about 1 wk  . Drug use: No    Home Medications Prior to Admission medications   Medication Sig Start Date End Date Taking? Authorizing Provider  acetaminophen-codeine (TYLENOL #3) 300-30 MG tablet Take 1-2 tablets by mouth every 8 (eight) hours as needed for moderate pain. 03/18/20   Kerrin Champagne, MD  albuterol (PROVENTIL HFA;VENTOLIN HFA) 108 (90 Base) MCG/ACT inhaler Inhale 2 puffs into the lungs every 6 (six) hours as needed for wheezing or shortness of breath. 05/30/16   Hedges, Tinnie Gens, PA-C  albuterol (PROVENTIL) (2.5 MG/3ML) 0.083% nebulizer solution Take 3 mLs (2.5 mg total) by nebulization every 6 (six) hours as needed for wheezing or shortness of breath. 05/30/16   Hedges, Tinnie Gens, PA-C  atorvastatin (LIPITOR) 40 MG tablet Take 1 tablet (40 mg total) daily by  mouth. Patient not taking: Reported on 04/23/2020 03/14/17   Bing Neighbors, FNP  carbamazepine (EQUETRO) 200 MG CP12 12 hr capsule Take 1 capsule (200 mg total) by mouth at bedtime. Patient not taking: Reported on 04/23/2020 09/07/14   Marlon Pel, PA-C  cetirizine (ZYRTEC) 10 MG tablet Take 1 tablet (10 mg total) by mouth daily. 12/17/17   Tilden Fossa, MD  diazepam (VALIUM) 5 MG tablet Take 5 mg at MRI scanner and may repeat one time after 30 minutes. Patient taking differently: Take 5 mg by mouth daily as needed for anxiety. 03/18/20   Kerrin Champagne, MD  fluticasone (FLONASE) 50 MCG/ACT nasal spray Place 2 sprays into both nostrils daily. Patient not taking: No sig reported 01/10/20   Joy, Shawn  C, PA-C  guaiFENesin-dextromethorphan (ROBITUSSIN DM) 100-10 MG/5ML syrup Take 10 mLs by mouth every 4 (four) hours as needed for cough. 04/25/20   Leatha Gilding, MD  hydrOXYzine (ATARAX/VISTARIL) 25 MG tablet Take 1-2 tablets (25-50 mg total) by mouth every 6 (six) hours as needed for itching. Patient not taking: Reported on 04/23/2020 09/20/16   Bing Neighbors, FNP  ibuprofen (ADVIL) 200 MG tablet Take 200 mg by mouth every 6 (six) hours as needed for moderate pain.    [provider]  lidocaine (LIDODERM) 5 % Place 1 patch onto the skin daily. Remove & Discard patch within 12 hours or as directed by MD Patient taking differently: Place 1 patch onto the skin daily. 01/10/20   Joy, Shawn C, PA-C  lidocaine (XYLOCAINE) 2 % solution Use as directed 15 mLs in the mouth or throat as needed for mouth pain. Patient taking differently: Use as directed 15 mLs in the mouth or throat daily as needed for mouth pain. 01/10/20   Joy, Shawn C, PA-C  lisinopril (PRINIVIL,ZESTRIL) 20 MG tablet Take 1 tablet (20 mg total) by mouth daily. Patient not taking: Reported on 04/23/2020 11/20/16   Bing Neighbors, FNP  methocarbamol (ROBAXIN) 500 MG tablet Take 1 tablet (500 mg total) by mouth 2 (two) times daily. Patient not taking: No sig reported 01/10/20   Joy, Shawn C, PA-C  polyethylene glycol (MIRALAX / GLYCOLAX) 17 g packet Take 17 g by mouth daily as needed for mild constipation. 03/06/19   Zannie Cove, MD  pregabalin (LYRICA) 75 MG capsule Take 1 capsule (75 mg total) by mouth 2 (two) times daily. Patient not taking: No sig reported 08/21/19   Kerrin Champagne, MD  risperiDONE (RISPERDAL) 0.5 MG tablet Take 0.5 mg by mouth 2 (two) times daily.     [provider]  tiZANidine (ZANAFLEX) 4 MG tablet Take 1 tablet (4 mg total) by mouth every 6 (six) hours as needed for muscle spasms. 08/16/17   Bethel Born, PA-C    Allergies    Haloperidol decanoate, Ibuprofen, Tramadol, Flexeril  [cyclobenzaprine], and Naproxen  Review of Systems   Review of Systems  Constitutional: Negative for chills and fever.  HENT: Negative for congestion and rhinorrhea.   Eyes: Positive for blurred vision, photophobia, pain, discharge, redness and visual disturbance.  Respiratory: Negative for cough and shortness of breath.   Cardiovascular: Negative for chest pain and palpitations.  Gastrointestinal: Negative for diarrhea, nausea and vomiting.  Genitourinary: Negative for difficulty urinating and dysuria.  Musculoskeletal: Negative for arthralgias and back pain.  Skin: Negative for rash and wound.  Neurological: Negative for light-headedness and headaches.    Physical Exam Updated Vital Signs BP (!) 151/102 (BP Location:  Left Arm)   Pulse 72   Temp 98.5 F (36.9 C) (Oral)   Resp 16   LMP 09/06/2016   SpO2 99%   Physical Exam Vitals and nursing note reviewed. Exam conducted with a chaperone present.  Constitutional:      General: She is not in acute distress.    Appearance: Normal appearance.  HENT:     Head: Normocephalic and atraumatic.     Nose: No rhinorrhea.  Eyes:     General:        Right eye: No discharge.        Left eye: No discharge.     Conjunctiva/sclera:     Right eye: Right conjunctiva is injected. No chemosis, exudate or hemorrhage.    Left eye: Left conjunctiva is injected. No chemosis, exudate or hemorrhage.    Comments: Pain with ocular motion specifically down pupils reactive to light no foreign body detected patient is tearing  Cardiovascular:     Rate and Rhythm: Normal rate and regular rhythm.  Pulmonary:     Effort: Pulmonary effort is normal. No respiratory distress.     Breath sounds: No stridor.  Abdominal:     General: Abdomen is flat. There is no distension.     Palpations: Abdomen is soft.  Musculoskeletal:        General: No tenderness or signs of injury.  Skin:    General: Skin is warm and dry.  Neurological:     General: No focal  deficit present.     Mental Status: She is alert. Mental status is at baseline.     Motor: No weakness.  Psychiatric:        Mood and Affect: Mood normal.        Behavior: Behavior normal.     ED Results / Procedures / Treatments   Labs (all labs ordered are listed, but only abnormal results are displayed) Labs Reviewed - No data to display  EKG None  Radiology No results found.  Procedures Procedures (including critical care time)  Medications Ordered in ED Medications  tetracaine (PONTOCAINE) 0.5 % ophthalmic solution 1 drop (1 drop Both Eyes Given by Other 05/10/20 0925)  fluorescein ophthalmic strip 1 strip (1 strip Both Eyes Given by Other 05/10/20 6948)    ED Course  I have reviewed the triage vital signs and the nursing notes.  Pertinent labs & imaging results that were available during my care of the patient were reviewed by me and considered in my medical decision making (see chart for details).    MDM Rules/Calculators/A&P                          55 year old female comes in with bilateral eye irritation tearing.  She states this happened a year ago, not sure if it is related to work, possible exposures.  She thinks it might be also related to allergies.  Will get tetracaine will get staining will get visual acuity will get pressures.  I spoke with her ophthalmologist on-call Dr. Katy Fitch, he is happy to do further evaluation with detailed slit-lamp and posterior chamber exam in his office, he asked [place] to send her over immediately and he will see her as a walk-in.  Patient agrees to this and we will get the patient to their clinic.  Pt is safe for DC home with outpatient follow up. The patient  agrees with the plan and has no other questions or concerns.  Final Clinical Impression(s) / ED Diagnoses Final diagnoses:  Eye irritation    Rx / DC Orders ED Discharge Orders    None       Sabino Donovan, MD 05/10/20 [phone]

## 2020-05-10 NOTE — Progress Notes (Signed)
05/10/2020 @ 12:50pm   TOC CM/CSW attempted to contact pt.  When [employer] arrived pt had left the premises.  D[employer]Driver contacted the pt to ask pt where she was so he could pick her up.  Pt hung up on driver.  CSW made 4 attempts to contact pt.  CSW left HIPPA compliant message with my contact information.  Steward Sames Tarpley-Carter, MSW, LCSW-A Pronouns:  She, Her, Hers                  [facility] ED Transitions of CareClinical Social Worker [email or web address] [phone]

## 2020-05-17 ENCOUNTER — Other Ambulatory Visit: Payer: Medicaid Other

## 2020-06-30 ENCOUNTER — Ambulatory Visit (HOSPITAL_COMMUNITY): Payer: Medicaid Other | Admitting: Licensed Clinical Social Worker

## 2020-06-30 ENCOUNTER — Other Ambulatory Visit: Payer: Self-pay

## 2020-06-30 ENCOUNTER — Telehealth (HOSPITAL_COMMUNITY): Payer: Self-pay | Admitting: Licensed Clinical Social Worker

## 2020-06-30 NOTE — Telephone Encounter (Signed)
LCSW sent text link for video session per schedule. When pt failed to sign on LCSW called pt. Call rang but was not answered. Outgoing message states vm box is full, unable to lvm.

## 2020-07-01 ENCOUNTER — Telehealth (HOSPITAL_COMMUNITY): Payer: Medicaid Other | Admitting: Psychiatry

## 2020-09-08 ENCOUNTER — Ambulatory Visit (HOSPITAL_COMMUNITY): Payer: Medicaid Other | Admitting: Licensed Clinical Social Worker

## 2020-09-08 ENCOUNTER — Ambulatory Visit (HOSPITAL_COMMUNITY): Payer: Medicaid Other | Admitting: Physician Assistant

## 2020-11-26 IMAGING — MR MR CERVICAL SPINE W/O CM
5 series · 39 of 48 positions shown · non-contrast
Comparison: Cervical MRI 08/11/2011

CLINICAL DATA: Focal neuro deficit with stroke suspected. Unable to
ambulate.

EXAM:
MRI CERVICAL SPINE WITHOUT CONTRAST
TECHNIQUE: Multiplanar, multisequence MR imaging of the cervical spine was
performed. No intravenous contrast was administered.

[Series 1: T2 · sagittal · 3.0mm · 0.69mm/px · 6 of 14 slices shown (1 of 2)]
[im 1/14]
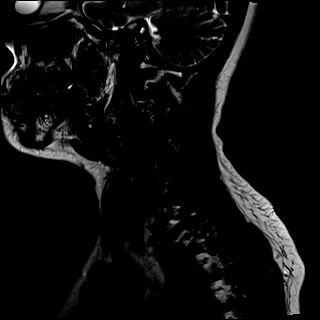
[im 3/14]
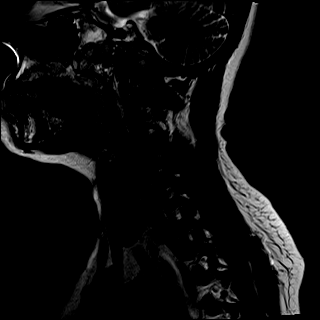
[im 6/14]
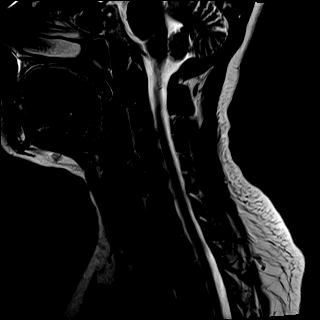
[im 8/14]
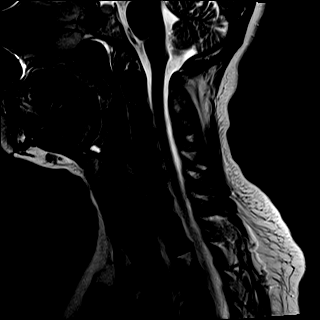
[im 11/14]
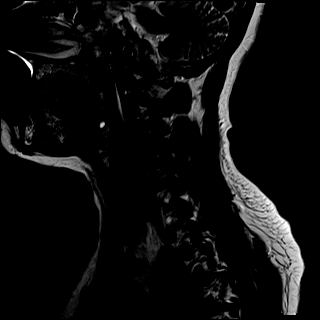
[im 14/14]
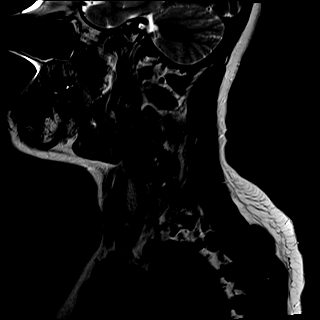

[Series 2: T1 · sagittal · 3.0mm · 0.69mm/px · 7 of 14 slices shown]
[im 1/14]
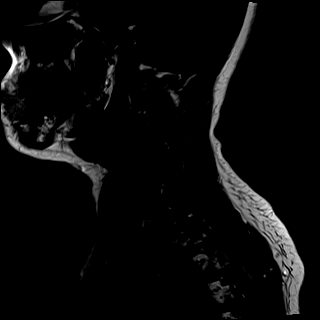
[im 3/14]
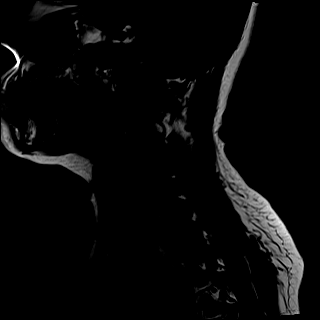
[im 5/14]
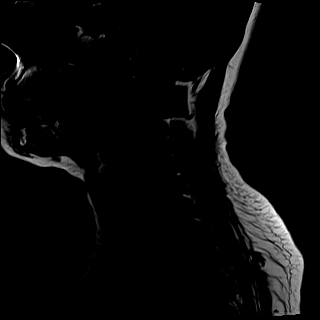
[im 7/14]
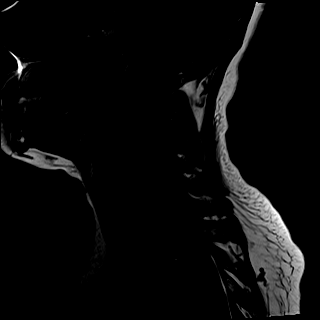
[im 9/14]
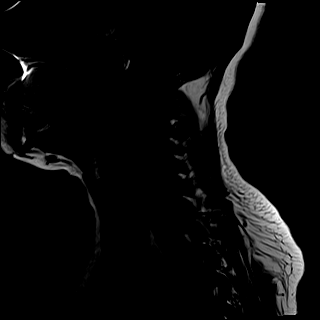
[im 11/14]
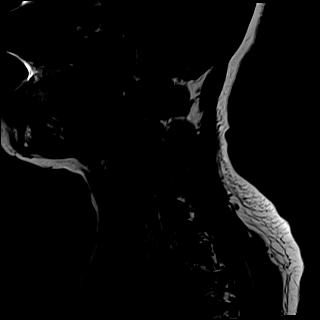
[im 14/14]
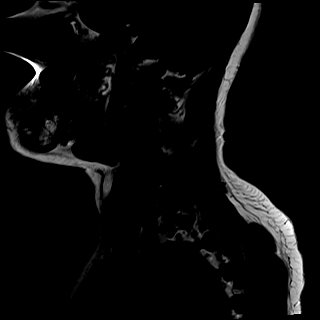

[Series 3: STIR · sagittal · 3.0mm · 0.86mm/px · 7 of 14 slices shown]
[im 1/14]
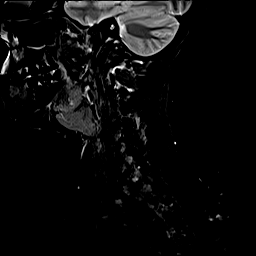
[im 3/14]
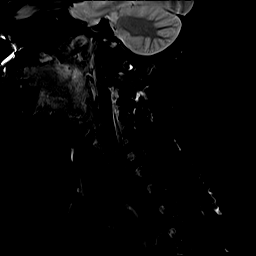
[im 5/14]
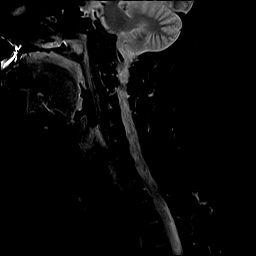
[im 7/14]
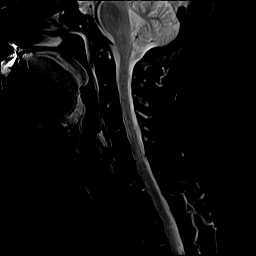
[im 9/14]
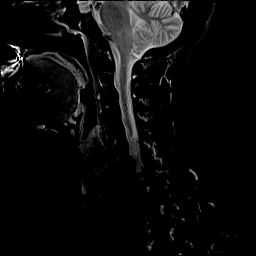
[im 11/14]
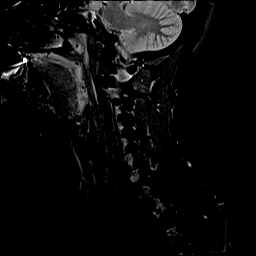
[im 14/14]
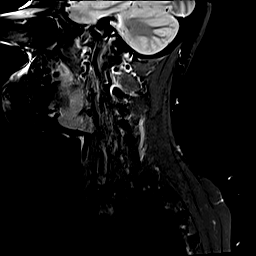

[Series 4: T2 · axial · 3.0mm · 0.66mm/px · z∈[-134,-45]mm · 11 of 29 slices shown (2 of 2)]
[im 1/29]
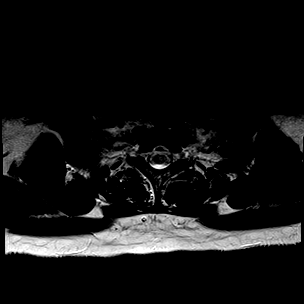
[im 3/29]
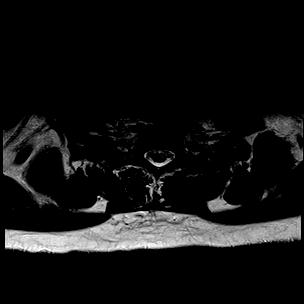
[im 5/29]
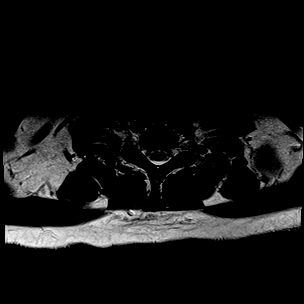
[im 7/29]
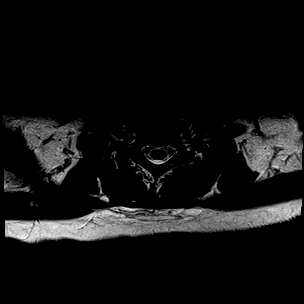
[im 9/29]
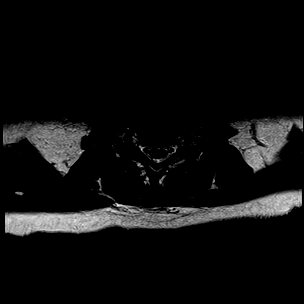
[im 11/29]
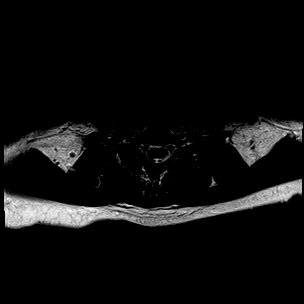
[im 13/29]
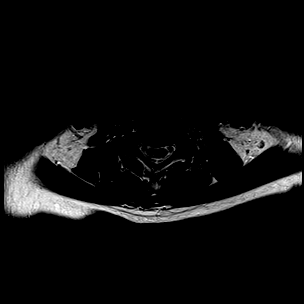
[im 16/29]
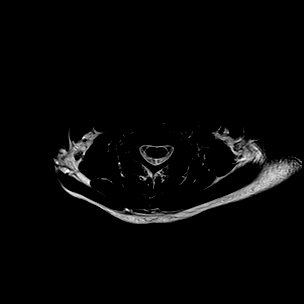
[im 20/29]
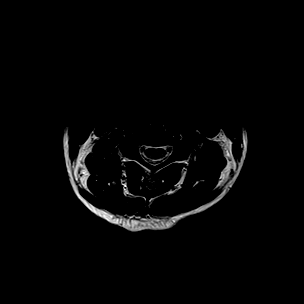
[im 24/29]
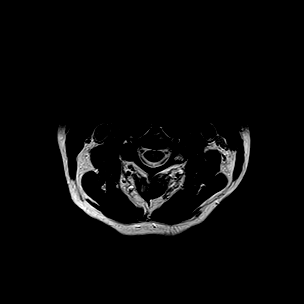
[im 29/29]
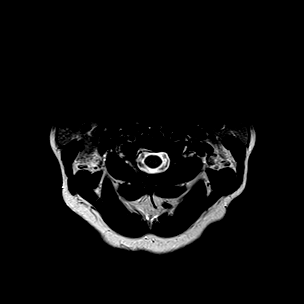

[Series 5: GRE · axial · 3.0mm · 0.35mm/px · z∈[-131,-42]mm · 8 of 29 slices shown]
[im 1/29]
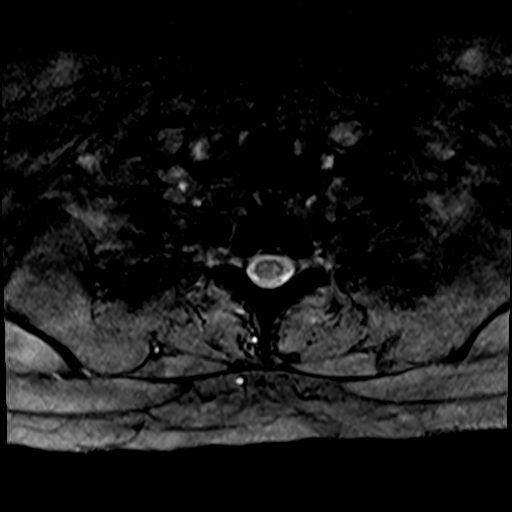
[im 5/29]
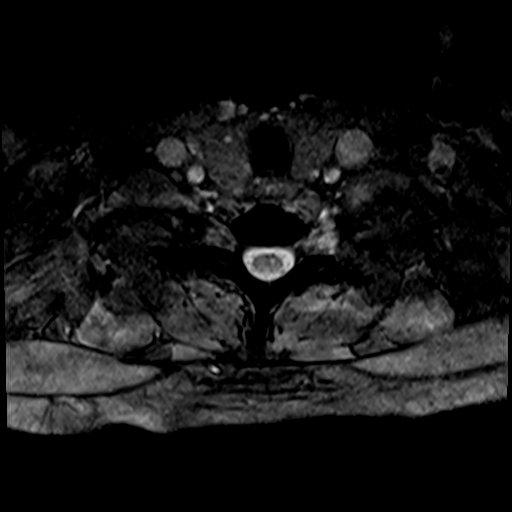
[im 9/29]
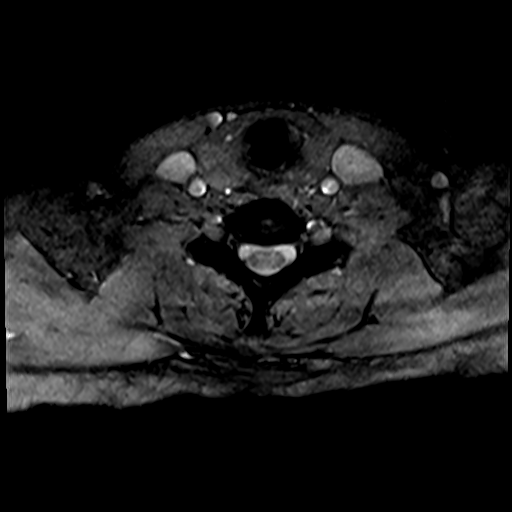
[im 13/29]
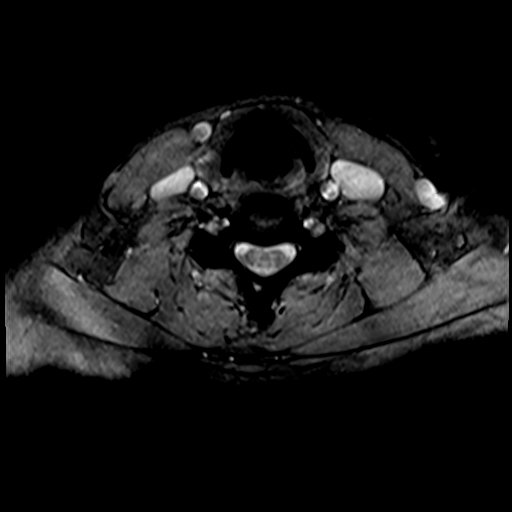
[im 16/29]
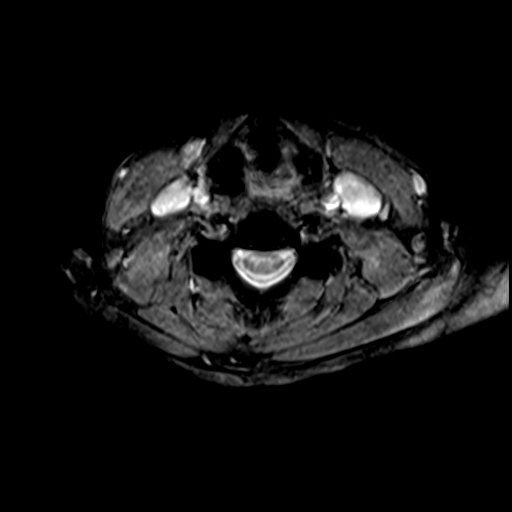
[im 20/29]
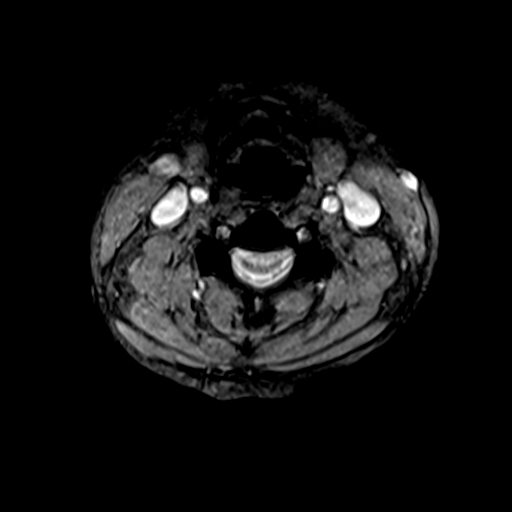
[im 24/29]
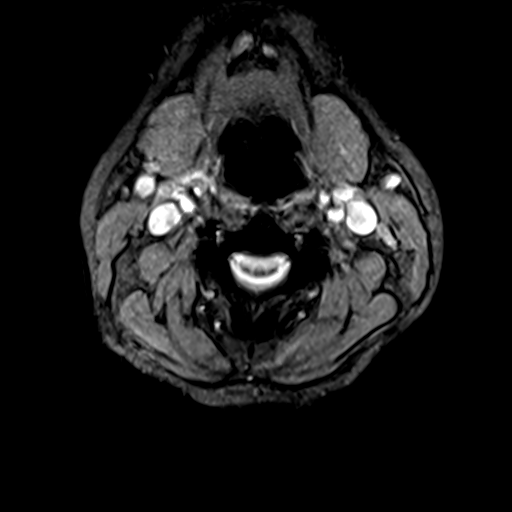
[im 29/29]
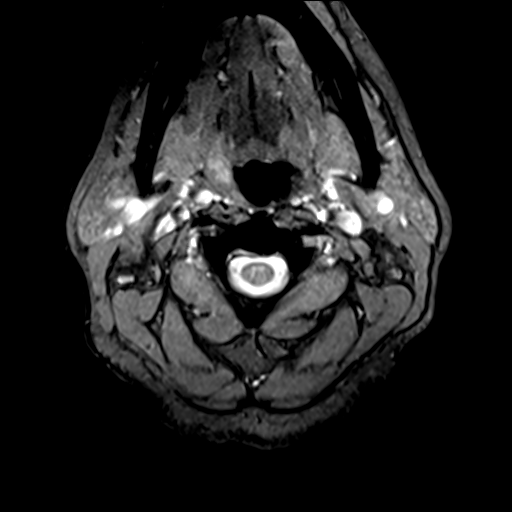

[39 of 48 positions shown; findings below may reference images not displayed]

FINDINGS: Alignment: Normal

Vertebrae: Small hemangiomas noted in the C3 left articular process
and T2 body. No fracture, discitis, or aggressive bone lesion

Cord: Normal signal and morphology.

Posterior Fossa, vertebral arteries, paraspinal tissues: Negative.

Disc levels:

C5-6 mild disc narrowing with small central protrusion. Mild
anterior endplate spurring.

C6-7 and T1-2 minor annulus bulging. No impingement seen throughout
the cervical spine
IMPRESSION: Normal MRI of the cervical cord.  No explanation for weakness.

## 2021-11-11 ENCOUNTER — Ambulatory Visit: Payer: Self-pay

## 2021-11-11 ENCOUNTER — Ambulatory Visit: Payer: Medicaid Other | Admitting: Surgery

## 2021-11-11 ENCOUNTER — Encounter: Payer: Self-pay | Admitting: Surgery

## 2021-11-11 DIAGNOSIS — G8929 Other chronic pain: Secondary | ICD-10-CM | POA: Diagnosis not present

## 2021-11-11 DIAGNOSIS — M545 Low back pain, unspecified: Secondary | ICD-10-CM

## 2021-11-11 DIAGNOSIS — M4726 Other spondylosis with radiculopathy, lumbar region: Secondary | ICD-10-CM | POA: Diagnosis not present

## 2021-11-11 MED ORDER — METHYLPREDNISOLONE ACETATE 80 MG/ML IJ SUSP: 80.0000 mg | Freq: Once | INTRAMUSCULAR | Status: AC

## 2021-11-11 MED ORDER — KETOROLAC TROMETHAMINE 30 MG/ML IJ SOLN: 30.0000 mg | Freq: Once | INTRAMUSCULAR | Status: AC

## 2021-11-11 NOTE — Progress Notes (Signed)
Office Visit Note   Patient: Barbara Alvarez           Date of Birth: May 29, 1965           MRN: [id number] Visit Date: 11/11/2021              Requested by: Leilani Able, MD [address] [place],  [place] [address] PCP: Leilani Able, MD   Assessment & Plan: Visit Diagnoses:  1. Chronic low back pain, unspecified back pain laterality, unspecified whether sciatica present   2. Other spondylosis with radiculopathy, lumbar region     Plan: With patient's ongoing symptoms I will go ahead and repeat lumbar MRI as mentioned by Dr. Otelia Sergeant and November 2021.  I advised patient again that we will ultimately come down her likely needing surgery.  Follow-up with Dr. Otelia Sergeant in 3 weeks to discuss results and further treatment options.  In hopes of giving her some temporary improvement of her current pain she was given Toradol 30 mg and Depo-Medrol 80 mg IM injections.  Follow-Up Instructions: Return in about 3 weeks (around 12/02/2021) for With Dr. Otelia Sergeant to review lumbar MRI and discuss surgical intervention.   Orders:  Orders Placed This Encounter  Procedures  . XR Lumbar Spine 2-3 Views  . MR Lumbar Spine w/o contrast   Meds ordered this encounter  Medications  . methylPREDNISolone acetate (DEPO-MEDROL) injection 80 mg  . ketorolac (TORADOL) 30 MG/ML injection 30 mg      Procedures: No procedures performed   Clinical Data: No additional findings.   Subjective: Chief Complaint  Patient presents with  . Lower Back - Pain    HPI 56 year old black female with known history of lumbar spondylosis and L5 retrolisthesis comes in with complaints of chronic worsening low back pain and bilateral lower extremity radiculopathy.  Patient last seen by Dr. Otelia Sergeant in November 2021 and at that time he was recommending L5-S1 fusion.  Patient states that she did not return because she was homeless.  States that her symptoms are worsening.  Pain increased with walking, bending and describes this as  being constant.  No complaints of bowel or bladder incontinence. Review of Systems No current cardiopulmonary GI/GU  Objective: Vital Signs: LMP 08/21/2016   Physical Exam HENT:     Head: Normocephalic and atraumatic.     Nose: Nose normal.  Eyes:     Extraocular Movements: Extraocular movements intact.  Pulmonary:     Effort: No respiratory distress.  Musculoskeletal:     Comments: Gait is antalgic.  Pain with lumbar flexion.  Bilateral lumbar paraspinal tenderness/spasm.  Negative logroll bilateral hips.  Positive bilateral straight leg raise.  Question trace bilateral anterior tib and gastroc weakness.  Neurological:     Mental Status: She is alert and oriented to person, place, and time.  Psychiatric:        Mood and Affect: Mood normal.   Ortho Exam  Specialty Comments:  No specialty comments available.  Imaging: No results found.   PMFS History: Patient Active Problem List   Diagnosis Date Noted  . Pneumonia due to COVID-19 virus 04/23/2020  . Hyperlipidemia 04/23/2020  . Hypokalemia 04/23/2020  . Numbness and tingling of leg   . Pain in both lower legs 03/04/2019  . Cervical spine pain 01/21/2019  . Pain in right finger(s) 11/22/2016  . Finger pain, left 11/22/2016  . Chronic right shoulder pain 11/22/2016  . Essential hypertension 05/30/2016  . Right shoulder injury, initial encounter 05/30/2016  .  Seasonal allergic rhinitis 05/30/2016  . Muscle spasm of right shoulder 05/30/2016  . Need for immunization against influenza 05/30/2016  . Bipolar disorder (HCC) 05/30/2016  . Acquired mallet finger of right hand 06/15/2015   Past Medical History:  Diagnosis Date  . Bipolar 1 disorder (HCC)   . Bronchitis   . Chronic back pain   . Chronic dental pain   . Chronic neck pain   . Chronic pain in left foot    since great toe fx  . Claustrophobia   . Headache   . Hearing loss in right ear   . Hypertension   . Shoulder pain     Family History  Problem  Relation Age of Onset  . Cancer Mother   . Hypertension Father   . Diabetes Other     Past Surgical History:  Procedure Laterality Date  . CHOLECYSTECTOMY    . LIGAMENT REPAIR     left arm   Social History   Occupational History  . Not on file  Tobacco Use  . Smoking status: Every Day    Packs/day: 0.25    Types: Cigarettes  . Smokeless tobacco: Never  Vaping Use  . Vaping Use: Never used  Substance and Sexual Activity  . Alcohol use: Yes    Comment: hasn't had drink in about 1 wk  . Drug use: No  . Sexual activity: Yes

## 2021-11-14 ENCOUNTER — Telehealth: Payer: Self-pay | Admitting: Surgery

## 2021-11-14 NOTE — Telephone Encounter (Signed)
Pt called requesting a call back from [place] or [place]. Pt states she got an injection Friday and it's not working and has the same pain. Please call pt at [phone].

## 2021-11-17 NOTE — Telephone Encounter (Signed)
Called and advised, she has this scheduled already  11/05/21-MRI ROV with Otelia Sergeant 12/09/21

## 2021-11-20 ENCOUNTER — Inpatient Hospital Stay: Admission: RE | Admit: 2021-11-20 | Payer: Medicaid Other | Source: Ambulatory Visit

## 2021-12-09 ENCOUNTER — Ambulatory Visit: Payer: Medicaid Other | Admitting: Specialist

## 2022-06-23 ENCOUNTER — Ambulatory Visit (INDEPENDENT_AMBULATORY_CARE_PROVIDER_SITE_OTHER): Payer: Medicaid Other

## 2022-06-23 ENCOUNTER — Encounter (HOSPITAL_COMMUNITY): Payer: Self-pay

## 2022-06-23 ENCOUNTER — Ambulatory Visit (HOSPITAL_COMMUNITY)
Admission: EM | Admit: 2022-06-23 | Discharge: 2022-06-23 | Disposition: A | Discharge status: Home / Self Care | Payer: Medicaid Other | Attending: Emergency Medicine | Admitting: Emergency Medicine

## 2022-06-23 DIAGNOSIS — S61451A Open bite of right hand, initial encounter: Secondary | ICD-10-CM

## 2022-06-23 DIAGNOSIS — W540XXA Bitten by dog, initial encounter: Secondary | ICD-10-CM

## 2022-06-23 DIAGNOSIS — M79641 Pain in right hand: Secondary | ICD-10-CM

## 2022-06-23 NOTE — Discharge Instructions (Addendum)
Your x-ray was negative for any fracture or dislocation.  Does not appear that the dog bite broke your skin, so we will not start the rabies vaccine series.  You can take Tylenol 1000 mg as needed for pain, since you are allergic to NSAIDs.  Please ice the area, as this will help with pain and swelling.  If your symptoms do not improve, or get worse over the next week please return.

## 2022-06-23 NOTE — ED Provider Notes (Signed)
[place]    CSN: [id number] Arrival date & time: 06/23/22  1748      History   Chief Complaint Chief Complaint  Patient presents with   Animal Bite    HPI Barbara Alvarez is a 57 y.o. female.   Patient reports dog bite to right hand yesterday.  She was wearing gloves and reports it went through the gloves.  Did not have any bleeding or obvious puncture wound at the time of attack.  Today she comes in because her right hand is painful, red and swollen.    Animal Bite Associated symptoms: no rash     Past Medical History:  Diagnosis Date   Bipolar 1 disorder ([place])    Bronchitis    Chronic back pain    Chronic dental pain    Chronic neck pain    Chronic pain in left foot    since great toe fx   Claustrophobia    Headache    Hearing loss in right ear    Hypertension    Shoulder pain     Patient Active Problem List   Diagnosis Date Noted   Pneumonia due to COVID-19 virus 04/23/2020   Hyperlipidemia 04/23/2020   Hypokalemia 04/23/2020   Numbness and tingling of leg    Pain in both lower legs 03/04/2019   Cervical spine pain 01/21/2019   Pain in right finger(s) 11/22/2016   Finger pain, left 11/22/2016   Chronic right shoulder pain 11/22/2016   Essential hypertension 05/30/2016   Right shoulder injury, initial encounter 05/30/2016   Seasonal allergic rhinitis 05/30/2016   Muscle spasm of right shoulder 05/30/2016   Need for immunization against influenza 05/30/2016   Bipolar disorder ([place]) 05/30/2016   Acquired mallet finger of right hand 06/15/2015    Past Surgical History:  Procedure Laterality Date   CHOLECYSTECTOMY     LIGAMENT REPAIR     left arm    OB History   No obstetric history on file.      Home Medications    Prior to Admission medications   Medication Sig Start Date End Date Taking? Authorizing Provider  albuterol (PROVENTIL HFA;VENTOLIN HFA) 108 (90 Base) MCG/ACT inhaler Inhale 2 puffs into the lungs every 6 (six)  hours as needed for wheezing or shortness of breath. 05/30/16  Yes Hedges, Dellis Filbert, PA-C  albuterol (PROVENTIL) (2.5 MG/3ML) 0.083% nebulizer solution Take 3 mLs (2.5 mg total) by nebulization every 6 (six) hours as needed for wheezing or shortness of breath. 05/30/16  Yes Hedges, Dellis Filbert, PA-C  cetirizine (ZYRTEC) 10 MG tablet Take 1 tablet (10 mg total) by mouth daily. 12/17/17  Yes Quintella Reichert, MD  diazepam (VALIUM) 5 MG tablet Take 5 mg at MRI scanner and may repeat one time after 30 minutes. Patient taking differently: Take 5 mg by mouth daily as needed for anxiety. 03/18/20  Yes Jessy Oto, MD  hydrOXYzine (ATARAX/VISTARIL) 25 MG tablet Take 1-2 tablets (25-50 mg total) by mouth every 6 (six) hours as needed for itching. 09/20/16  Yes Scot Jun, NP  ibuprofen (ADVIL) 200 MG tablet Take 200 mg by mouth every 6 (six) hours as needed for moderate pain.   Yes [provider]  lisinopril (PRINIVIL,ZESTRIL) 20 MG tablet Take 1 tablet (20 mg total) by mouth daily. Patient not taking: Reported on 04/23/2020 11/20/16   Scot Jun, NP  pregabalin (LYRICA) 75 MG capsule Take 1 capsule (75 mg total) by mouth 2 (two) times daily. Patient not  taking: Reported on 04/23/2020 08/21/19   Jessy Oto, MD  risperiDONE (RISPERDAL) 0.5 MG tablet Take 0.5 mg by mouth 2 (two) times daily.     [provider]  tiZANidine (ZANAFLEX) 4 MG tablet Take 1 tablet (4 mg total) by mouth every 6 (six) hours as needed for muscle spasms. 08/16/17   Recardo Evangelist, PA-C    Family History Family History  Problem Relation Age of Onset   Cancer Mother    Hypertension Father    Diabetes Other     Social History Social History   Tobacco Use   Smoking status: Every Day    Packs/day: 0.25    Types: Cigarettes   Smokeless tobacco: Never  Vaping Use   Vaping Use: Never used  Substance Use Topics   Alcohol use: Yes    Comment: hasn't had drink in about 1 wk   Drug use: No      Allergies   Haloperidol decanoate, Ibuprofen, Tramadol, Flexeril [cyclobenzaprine], and Naproxen   Review of Systems Review of Systems  Constitutional:  Negative for chills and fatigue.  Respiratory:  Negative for cough, chest tightness and shortness of breath.   Cardiovascular:  Negative for chest pain.  Musculoskeletal:  Positive for arthralgias.  Skin:  Positive for color change. Negative for rash and wound.     Physical Exam Triage Vital Signs ED Triage Vitals  Enc Vitals Group     BP 06/23/22 1813 (!) 142/64     Pulse Rate 06/23/22 1813 76     Resp 06/23/22 1813 16     Temp 06/23/22 1813 98.3 F (36.8 C)     Temp Source 06/23/22 1813 Oral     SpO2 06/23/22 1813 94 %     Weight 06/23/22 1812 167 lb 12.3 oz (76.1 kg)     Height 06/23/22 1812 5' 6"$  (1.676 m)     Head Circumference --      Peak Flow --      Pain Score 06/23/22 1808 10     Pain Loc --      Pain Edu? --      Excl. in [place]? --    No data found.  Updated Vital Signs BP (!) 142/64 (BP Location: Left Arm)   Pulse 76   Temp 98.3 F (36.8 C) (Oral)   Resp 16   Ht 5' 6"$  (1.676 m)   Wt 140 lb 3.2 oz (63.6 kg)   LMP 08/21/2016   SpO2 94%   BMI 22.63 kg/m   Visual Acuity Right Eye Distance:   Left Eye Distance:   Bilateral Distance:    Right Eye Near:   Left Eye Near:    Bilateral Near:     Physical Exam Vitals and nursing note reviewed.  Constitutional:      Appearance: Normal appearance.     Comments: Pleasant 57 year old female who appears stated age.  HENT:     Head: Normocephalic and atraumatic.     Right Ear: External ear normal.     Left Ear: External ear normal.     Nose: Nose normal.  Cardiovascular:     Rate and Rhythm: Normal rate and regular rhythm.     Pulses: Normal pulses.     Heart sounds: Normal heart sounds, S1 normal and S2 normal. No murmur heard. Pulmonary:     Effort: Pulmonary effort is normal. No respiratory distress.     Breath sounds: Normal breath sounds.      Comments: Lungs  vesicular posteriorly. Musculoskeletal:        General: Tenderness and signs of injury present. No swelling or deformity. Normal range of motion.  Skin:    General: Skin is warm.     Capillary Refill: Capillary refill takes less than 2 seconds.     Findings: Signs of injury present.     Comments: Right hand with first and second metacarpal tenderness and mild erythema.  Without obvious edema, deformity, puncture, laceration, wound.  Neurological:     Mental Status: She is alert.  Psychiatric:        Behavior: Behavior is cooperative.      UC Treatments / Results  Labs (all labs ordered are listed, but only abnormal results are displayed) Labs Reviewed - No data to display  EKG   Radiology DG Hand Complete Right  Result Date: 06/23/2022 CLINICAL DATA:  Recent dog bite with hand pain, initial encounter EXAM: RIGHT HAND - COMPLETE 3+ VIEW COMPARISON:  None Available. FINDINGS: There is no evidence of fracture or dislocation. There is no evidence of arthropathy or other focal bone abnormality. Soft tissues are unremarkable. IMPRESSION: No acute abnormality noted. Electronically Signed   By: Inez Catalina M.D.   On: 06/23/2022 18:43    Procedures Procedures (including critical care time)  Medications Ordered in UC Medications - No data to display  Initial Impression / Assessment and Plan / UC Course  I have reviewed the triage vital signs and the nursing notes.  Pertinent labs & imaging results that were available during my care of the patient were reviewed by me and considered in my medical decision making (see chart for details).  Since there was no puncture wound or bleeding at the time of the bite, discussed RBA of rabies immunization today, and will refrain from treatment.  X-ray without soft tissue edema, fracture or dislocation.  Discussed symptomatic treatment with Tylenol and ice as needed.  Plan of care return precautions discussed with patient,  verbalized understanding.     Final Clinical Impressions(s) / UC Diagnoses   Final diagnoses:  Dog bite of right hand, initial encounter     Discharge Instructions      Your x-ray was negative for any fracture or dislocation.  Does not appear that the dog bite broke your skin, so we will not start the rabies vaccine series.  You can take Tylenol 1000 mg as needed for pain, since you are allergic to NSAIDs.  Please ice the area, as this will help with pain and swelling.  If your symptoms do not improve, or get worse over the next week please return.     ED Prescriptions   None    I have reviewed the PDMP during this encounter.   Louretta Shorten [place], [place] 06/23/22 [phone]

## 2022-06-23 NOTE — ED Triage Notes (Signed)
Chief Complaint: Patient was bit by her boss dog. States unsure I the dog is up to date on shots. Was bit in the right hand.   Onset: yesterday morning   Prescriptions or OTC medications tried: No    Sick exposure: Yes- states the dog was scratching at itself like it had fleas.

## 2022-12-03 ENCOUNTER — Emergency Department (HOSPITAL_COMMUNITY): Payer: MEDICAID

## 2022-12-03 ENCOUNTER — Other Ambulatory Visit: Payer: Self-pay

## 2022-12-03 ENCOUNTER — Emergency Department (HOSPITAL_COMMUNITY)
Admission: EM | Admit: 2022-12-03 | Discharge: 2022-12-03 | Disposition: A | Discharge status: Home / Self Care | Payer: MEDICAID | Attending: Emergency Medicine | Admitting: Emergency Medicine

## 2022-12-03 ENCOUNTER — Encounter (HOSPITAL_COMMUNITY): Payer: Self-pay

## 2022-12-03 DIAGNOSIS — S8991XA Unspecified injury of right lower leg, initial encounter: Secondary | ICD-10-CM | POA: Diagnosis present

## 2022-12-03 DIAGNOSIS — S8391XA Sprain of unspecified site of right knee, initial encounter: Secondary | ICD-10-CM | POA: Insufficient documentation

## 2022-12-03 DIAGNOSIS — S93601A Unspecified sprain of right foot, initial encounter: Secondary | ICD-10-CM | POA: Insufficient documentation

## 2022-12-03 DIAGNOSIS — I10 Essential (primary) hypertension: Secondary | ICD-10-CM | POA: Insufficient documentation

## 2022-12-03 DIAGNOSIS — S93401A Sprain of unspecified ligament of right ankle, initial encounter: Secondary | ICD-10-CM | POA: Diagnosis not present

## 2022-12-03 DIAGNOSIS — X501XXA Overexertion from prolonged static or awkward postures, initial encounter: Secondary | ICD-10-CM | POA: Diagnosis not present

## 2022-12-03 DIAGNOSIS — W19XXXA Unspecified fall, initial encounter: Secondary | ICD-10-CM

## 2022-12-03 MED ORDER — ACETAMINOPHEN 325 MG PO TABS: 975.0000 mg | ORAL_TABLET | Freq: Four times a day (QID) | ORAL | 0 refills | Status: AC | PRN

## 2022-12-03 MED ORDER — OXYCODONE HCL 5 MG PO TABS: 5.0000 mg | ORAL_TABLET | Freq: Four times a day (QID) | ORAL | 0 refills | Status: AC | PRN

## 2022-12-03 MED ORDER — OXYCODONE HCL 5 MG PO TABS
10.0000 mg | ORAL_TABLET | Freq: Once | ORAL | Status: AC
Start: 2022-12-03 — End: 2022-12-03
  Administered 2022-12-03: 10 mg via ORAL
  Filled 2022-12-03: qty 2

## 2022-12-03 NOTE — ED Provider Notes (Signed)
[place] EMERGENCY DEPARTMENT AT [facility] Provider Note   CSN: [id number] Arrival date & time: 12/03/22  8469     History  Chief Complaint  Patient presents with   Barbara Alvarez is a 57 y.o. female with past medical history of bipolar disorder, chronic back and neck pain, hypertension who presents to the ED complaining of a mechanical fall.  She states that [place] and there was a wet floor without a sign and that she accidentally slipped and landed on her right side.  She complains of right leg pain from her right knee down to her right foot.  States that she has not been able to walk since the injury.  At baseline, she ambulates unassisted.  She denies hitting her head or losing consciousness.  No medications for pain taken before arrival.    Home Medications Prior to Admission medications   Medication Sig Start Date End Date Taking? Authorizing Provider  acetaminophen (TYLENOL) 325 MG tablet Take 3 tablets (975 mg total) by mouth every 6 (six) hours as needed for up to 3 days for moderate pain. 12/03/22 12/06/22 Yes Jashley Yellin L, PA-C  oxyCODONE (ROXICODONE) 5 MG immediate release tablet Take 1 tablet (5 mg total) by mouth every 6 (six) hours as needed for up to 3 days for severe pain or breakthrough pain. 12/03/22 12/06/22 Yes Mariaisabel Bodiford L, PA-C  albuterol (PROVENTIL HFA;VENTOLIN HFA) 108 (90 Base) MCG/ACT inhaler Inhale 2 puffs into the lungs every 6 (six) hours as needed for wheezing or shortness of breath. 05/30/16   Hedges, Tinnie Gens, PA-C  albuterol (PROVENTIL) (2.5 MG/3ML) 0.083% nebulizer solution Take 3 mLs (2.5 mg total) by nebulization every 6 (six) hours as needed for wheezing or shortness of breath. 05/30/16   Hedges, Tinnie Gens, PA-C  cetirizine (ZYRTEC) 10 MG tablet Take 1 tablet (10 mg total) by mouth daily. 12/17/17   Tilden Fossa, MD  diazepam (VALIUM) 5 MG tablet Take 5 mg at MRI scanner and may repeat one time after 30  minutes. Patient taking differently: Take 5 mg by mouth daily as needed for anxiety. 03/18/20   Kerrin Champagne, MD  hydrOXYzine (ATARAX/VISTARIL) 25 MG tablet Take 1-2 tablets (25-50 mg total) by mouth every 6 (six) hours as needed for itching. 09/20/16   Bing Neighbors, NP  ibuprofen (ADVIL) 200 MG tablet Take 200 mg by mouth every 6 (six) hours as needed for moderate pain.    [provider]  lisinopril (PRINIVIL,ZESTRIL) 20 MG tablet Take 1 tablet (20 mg total) by mouth daily. Patient not taking: Reported on 04/23/2020 11/20/16   Bing Neighbors, NP  pregabalin (LYRICA) 75 MG capsule Take 1 capsule (75 mg total) by mouth 2 (two) times daily. Patient not taking: Reported on 04/23/2020 08/21/19   Kerrin Champagne, MD  risperiDONE (RISPERDAL) 0.5 MG tablet Take 0.5 mg by mouth 2 (two) times daily.     [provider]  tiZANidine (ZANAFLEX) 4 MG tablet Take 1 tablet (4 mg total) by mouth every 6 (six) hours as needed for muscle spasms. 08/16/17   Bethel Born, PA-C      Allergies    Haloperidol decanoate, Tramadol, Flexeril [cyclobenzaprine], and Naproxen    Review of Systems   Review of Systems  All other systems reviewed and are negative.   Physical Exam Updated Vital Signs BP 129/73 (BP Location: Right Arm)   Pulse 78   Temp 98.4 F (36.9  C) (Oral)   Resp 18   Ht 5\' 7"  (1.702 m)   Wt 65.8 kg   LMP 08/21/2016   SpO2 100%   BMI 22.71 kg/m  Physical Exam Vitals and nursing note reviewed.  Constitutional:      General: She is not in acute distress.    Appearance: Normal appearance.  HENT:     Head: Normocephalic and atraumatic.     Mouth/Throat:     Mouth: Mucous membranes are moist.  Eyes:     Conjunctiva/sclera: Conjunctivae normal.  Cardiovascular:     Rate and Rhythm: Normal rate and regular rhythm.     Heart sounds: No murmur heard. Pulmonary:     Effort: Pulmonary effort is normal.     Breath sounds: Normal breath sounds.  Abdominal:      General: Abdomen is flat.     Palpations: Abdomen is soft.  Musculoskeletal:     Cervical back: Normal range of motion and neck supple.     Comments: Slight tenderness over the right patella, knee joint is stable, no significant swelling, moderate tenderness over the right proximal tib-fib as well as the right medial malleolus, no point tenderness to the right foot but patient states that she is having difficulty with range of motion secondary to generalized pain in the foot, no obvious deformity to the right lower extremity, no open wounds, 2+ DP and PT pulses, soft compartments  Skin:    General: Skin is warm and dry.     Capillary Refill: Capillary refill takes less than 2 seconds.  Neurological:     Mental Status: She is alert. Mental status is at baseline.  Psychiatric:        Behavior: Behavior is cooperative.     ED Results / Procedures / Treatments   Labs (all labs ordered are listed, but only abnormal results are displayed) Labs Reviewed - No data to display  EKG None  Radiology DG Foot 2 Views Right  Result Date: 12/03/2022 CLINICAL DATA:  fall EXAM: RIGHT FOOT - 2 VIEW; RIGHT TIBIA AND FIBULA - 2 VIEW COMPARISON:  None Available. FINDINGS: No acute fracture or dislocation. Joint spaces and alignment are maintained. No area of erosion or osseous destruction. No unexpected radiopaque foreign body. Soft tissues are unremarkable. IMPRESSION: 1. No acute fracture or dislocation. Electronically Signed   By: Meda Klinefelter M.D.   On: 12/03/2022 12:11   DG Tibia/Fibula Right  Result Date: 12/03/2022 CLINICAL DATA:  fall EXAM: RIGHT FOOT - 2 VIEW; RIGHT TIBIA AND FIBULA - 2 VIEW COMPARISON:  None Available. FINDINGS: No acute fracture or dislocation. Joint spaces and alignment are maintained. No area of erosion or osseous destruction. No unexpected radiopaque foreign body. Soft tissues are unremarkable. IMPRESSION: 1. No acute fracture or dislocation. Electronically Signed   By:  Meda Klinefelter M.D.   On: 12/03/2022 12:11   DG Ankle Complete Right  Result Date: 12/03/2022 CLINICAL DATA:  Fall.  Right ankle injury and pain. EXAM: RIGHT ANKLE - COMPLETE 3+ VIEW COMPARISON:  None Available. FINDINGS: There is no evidence of fracture, dislocation, or joint effusion. There is no evidence of arthropathy or other focal bone abnormality. Soft tissues are unremarkable. IMPRESSION: Negative. Electronically Signed   By: Danae Orleans M.D.   On: 12/03/2022 11:03    Procedures Procedures    Medications Ordered in ED Medications  oxyCODONE (Oxy IR/ROXICODONE) immediate release tablet 10 mg (10 mg Oral Given 12/03/22 1209)    ED Course/ Medical Decision  Making/ A&P                             Medical Decision Making Amount and/or Complexity of Data Reviewed Radiology: ordered. Decision-making details documented in ED Course.  Risk OTC drugs. Prescription drug management.   Medical Decision Making:   Barbara Alvarez is a 57 y.o. female who presented to the ED today with right leg pain detailed above.    Patient's presentation is complicated by their history of trauma, multiple comorbidities.  Complete initial physical exam performed, notably the patient was with multiple areas of tenderness particularly to the right knee, tib-fib, and ankle but neurovascularly intact with soft compartments.  Range of motion restricted by pain.    Reviewed and confirmed nursing documentation for past medical history, family history, social history.    Initial Assessment:   With the patient's presentation, differential diagnosis includes but is not limited to sprain, strain, fracture, dislocation, compartment syndrome, contusion. This is most consistent with an acute complicated illness  Initial Plan:  X-rays to evaluate for bony pathology Pain control Objective evaluation as below reviewed   Initial Study Results:   Radiology:  All images reviewed independently. Agree with  radiology report at this time.   DG Foot 2 Views Right  Result Date: 12/03/2022 CLINICAL DATA:  fall EXAM: RIGHT FOOT - 2 VIEW; RIGHT TIBIA AND FIBULA - 2 VIEW COMPARISON:  None Available. FINDINGS: No acute fracture or dislocation. Joint spaces and alignment are maintained. No area of erosion or osseous destruction. No unexpected radiopaque foreign body. Soft tissues are unremarkable. IMPRESSION: 1. No acute fracture or dislocation. Electronically Signed   By: Meda Klinefelter M.D.   On: 12/03/2022 12:11   DG Tibia/Fibula Right  Result Date: 12/03/2022 CLINICAL DATA:  fall EXAM: RIGHT FOOT - 2 VIEW; RIGHT TIBIA AND FIBULA - 2 VIEW COMPARISON:  None Available. FINDINGS: No acute fracture or dislocation. Joint spaces and alignment are maintained. No area of erosion or osseous destruction. No unexpected radiopaque foreign body. Soft tissues are unremarkable. IMPRESSION: 1. No acute fracture or dislocation. Electronically Signed   By: Meda Klinefelter M.D.   On: 12/03/2022 12:11   DG Ankle Complete Right  Result Date: 12/03/2022 CLINICAL DATA:  Fall.  Right ankle injury and pain. EXAM: RIGHT ANKLE - COMPLETE 3+ VIEW COMPARISON:  None Available. FINDINGS: There is no evidence of fracture, dislocation, or joint effusion. There is no evidence of arthropathy or other focal bone abnormality. Soft tissues are unremarkable. IMPRESSION: Negative. Electronically Signed   By: Danae Orleans M.D.   On: 12/03/2022 11:03      Final Assessment and Plan:   57 year old female presenting to the ED with right leg pain post mechanical fall.  She has pain to multiple areas including the knee, ankle, and foot.  Joints are stable.  No overlying wounds.  Neurovascularly intact.  Soft compartments.  X-ray is unremarkable.  She does have difficulty bearing weight and with ambulation so we will place in knee immobilizer with crutches and have follow-up with orthopedics for continued symptoms.  Patient expressed understanding  and agreement with this plan.  No head injury or LOC.  No other acute complaints today.  PDMP reviewed and no recent narcotic prescriptions.  Given that she has an NSAID allergy, will provide with a small amount of narcotic pain medication at home until she can follow-up with PCP or orthopedics.  Strict ED return precautions given, all  questions answered, and stable for discharge.   Clinical Impression:  1. Fall, initial encounter   2. Sprain of right knee, unspecified ligament, initial encounter   3. Sprain of right ankle, unspecified ligament, initial encounter   4. Sprain of right foot, initial encounter      Discharge           Final Clinical Impression(s) / ED Diagnoses Final diagnoses:  Fall, initial encounter  Sprain of right knee, unspecified ligament, initial encounter  Sprain of right ankle, unspecified ligament, initial encounter  Sprain of right foot, initial encounter    Rx / DC Orders ED Discharge Orders          Ordered    oxyCODONE (ROXICODONE) 5 MG immediate release tablet  Every 6 hours PRN        12/03/22 1228    acetaminophen (TYLENOL) 325 MG tablet  Every 6 hours PRN        12/03/22 1228              Tonette Lederer, PA-C 12/03/22 1444    Laurence Spates, MD 12/03/22 [phone]

## 2022-12-03 NOTE — Progress Notes (Signed)
Orthopedic Tech Progress Note Patient Details:  Barbara Alvarez June 03, 1965 [id number]  Ortho Devices Type of Ortho Device: Knee Immobilizer, Crutches Ortho Device/Splint Location: RLE Ortho Device/Splint Interventions: Ordered, Application, Adjustment   Post Interventions Patient Tolerated: Well, Ambulated well Instructions Provided: Poper ambulation with device, Care of device, Adjustment of device  [name] 12/03/2022, 1:02 PM

## 2022-12-03 NOTE — ED Triage Notes (Signed)
Pt arrived POV from home c/o a fall that happened yesterday where she twisted her right ankle but today it hurts worse.

## 2022-12-03 NOTE — Discharge Instructions (Addendum)
Your x-rays did not show any broken or out of place bones or other complications from your fall today.  Since you are having difficulty with moving the leg, we placed it in a knee immobilizer to use while you recover.  Please see attached instructions for caring for yourself using RICE therapy while dealing with musculoskeletal sprains.  Use the knee immobilizer and crutches to get around over the next week.  If you continue to have symptoms, follow-up with orthopedics.  I provided our on-call orthopedic for you to follow-up with as needed or you may go to an orthopedic of your own choosing.  I recommend being rechecked by your PCP this week.  I am prescribing medication to help with your pain at home as needed.  I recommend taking the Tylenol as your first-line for pain and for any breakthrough, severe pain using the narcotic pain medication oxycodone as needed for persistent pain.  For new falls or injuries or other worsening and concerning symptoms return to the nearest ED for reevaluation.

## 2022-12-26 ENCOUNTER — Ambulatory Visit: Payer: MEDICAID | Admitting: Family

## 2023-03-26 ENCOUNTER — Emergency Department (HOSPITAL_COMMUNITY)
Admission: EM | Admit: 2023-03-26 | Discharge: 2023-03-26 | Disposition: A | Discharge status: Home / Self Care | Payer: 59 | Attending: Emergency Medicine | Admitting: Emergency Medicine

## 2023-03-26 ENCOUNTER — Encounter (HOSPITAL_COMMUNITY): Payer: Self-pay | Admitting: Emergency Medicine

## 2023-03-26 ENCOUNTER — Emergency Department (HOSPITAL_COMMUNITY): Payer: 59

## 2023-03-26 ENCOUNTER — Other Ambulatory Visit: Payer: Self-pay

## 2023-03-26 DIAGNOSIS — F17200 Nicotine dependence, unspecified, uncomplicated: Secondary | ICD-10-CM | POA: Insufficient documentation

## 2023-03-26 DIAGNOSIS — Z1152 Encounter for screening for COVID-19: Secondary | ICD-10-CM | POA: Insufficient documentation

## 2023-03-26 DIAGNOSIS — J069 Acute upper respiratory infection, unspecified: Secondary | ICD-10-CM | POA: Insufficient documentation

## 2023-03-26 DIAGNOSIS — R059 Cough, unspecified: Secondary | ICD-10-CM | POA: Diagnosis present

## 2023-03-26 LAB — CBC
HCT: 38.9 % (ref 36.0–46.0)
Hemoglobin: 12.6 g/dL (ref 12.0–15.0)
MCH: 28.7 pg (ref 26.0–34.0)
MCHC: 32.4 g/dL (ref 30.0–36.0)
MCV: 88.6 fL (ref 80.0–100.0)
Platelets: 354 10*3/uL (ref 150–400)
RBC: 4.39 MIL/uL (ref 3.87–5.11)
RDW: 13.7 % (ref 11.5–15.5)
WBC: 4.3 10*3/uL (ref 4.0–10.5)
nRBC: 0 % (ref 0.0–0.2)

## 2023-03-26 LAB — COMPREHENSIVE METABOLIC PANEL
ALT: 21 U/L (ref 0–44)
AST: 24 U/L (ref 15–41)
Albumin: 3.7 g/dL (ref 3.5–5.0)
Alkaline Phosphatase: 58 U/L (ref 38–126)
Anion gap: 8 (ref 5–15)
BUN: 12 mg/dL (ref 6–20)
CO2: 23 mmol/L (ref 22–32)
Calcium: 8.9 mg/dL (ref 8.9–10.3)
Chloride: 107 mmol/L (ref 98–111)
Creatinine, Ser: 0.84 mg/dL (ref 0.44–1.00)
GFR, Estimated: >60 mL/min (ref >60)
Glucose, Bld: 91 mg/dL (ref 70–99)
Potassium: 3.8 mmol/L (ref 3.5–5.1)
Sodium: 138 mmol/L (ref 135–145)
Total Bilirubin: 0.2 mg/dL (ref <1.2)
Total Protein: 7.1 g/dL (ref 6.5–8.1)

## 2023-03-26 LAB — RESP PANEL BY RT-PCR (RSV, FLU A&B, COVID)  RVPGX2
Influenza A by PCR: NEGATIVE
Influenza B by PCR: NEGATIVE
Resp Syncytial Virus by PCR: NEGATIVE
SARS Coronavirus 2 by RT PCR: NEGATIVE

## 2023-03-26 MED ORDER — AMOXICILLIN-POT CLAVULANATE 875-125 MG PO TABS: 1.0000 | ORAL_TABLET | Freq: Two times a day (BID) | ORAL | 0 refills | Status: AC

## 2023-03-26 MED ORDER — AEROCHAMBER PLUS FLO-VU LARGE MISC
Status: AC
  Filled 2023-03-26: qty 1

## 2023-03-26 MED ORDER — AEROCHAMBER PLUS FLO-VU MISC
1.0000 | Freq: Once | Status: AC
  Administered 2023-03-26: 1
  Filled 2023-03-26: qty 1

## 2023-03-26 MED ORDER — AMOXICILLIN-POT CLAVULANATE 875-125 MG PO TABS
1.0000 | ORAL_TABLET | Freq: Once | ORAL | Status: AC
  Administered 2023-03-26: 1 via ORAL
  Filled 2023-03-26: qty 1

## 2023-03-26 MED ORDER — ALBUTEROL SULFATE HFA 108 (90 BASE) MCG/ACT IN AERS
2.0000 | INHALATION_SPRAY | Freq: Once | RESPIRATORY_TRACT | Status: AC
  Administered 2023-03-26: 2 via RESPIRATORY_TRACT
  Filled 2023-03-26: qty 6.7

## 2023-03-26 MED ORDER — DEXAMETHASONE 4 MG PO TABS
10.0000 mg | ORAL_TABLET | Freq: Once | ORAL | Status: AC
  Administered 2023-03-26: 10 mg via ORAL
  Filled 2023-03-26: qty 3

## 2023-03-26 MED ORDER — BENZONATATE 100 MG PO CAPS: 100.0000 mg | ORAL_CAPSULE | Freq: Three times a day (TID) | ORAL | 0 refills | Status: AC

## 2023-03-26 MED ORDER — ONDANSETRON 4 MG PO TBDP: ORAL_TABLET | ORAL | 0 refills | Status: AC

## 2023-03-26 NOTE — ED Triage Notes (Signed)
Pt reports cough and nasal congestion that started on Saturday. Pt also reports body aches and chills.

## 2023-03-26 NOTE — ED Provider Notes (Signed)
Barbara Alvarez Provider Note   CSN: [id number] Arrival date & time: 03/26/23  1046     History  Chief Complaint  Patient presents with   Cough    Barbara Alvarez is a 57 y.o. female.  57 yo F with a chief complaints of headache cough and congestion.  Going on for couple days.  No known sick contacts.  She felt like she was too weak to go to work today.  Came in for evaluation.  Everyday smoker.   Cough      Home Medications Prior to Admission medications   Medication Sig Start Date End Date Taking? Authorizing Provider  amoxicillin-clavulanate (AUGMENTIN) 875-125 MG tablet Take 1 tablet by mouth every 12 (twelve) hours. 03/26/23  Yes Barbara Plan, DO  benzonatate (TESSALON) 100 MG capsule Take 1 capsule (100 mg total) by mouth every 8 (eight) hours. 03/26/23  Yes Barbara Plan, DO  ondansetron (ZOFRAN-ODT) 4 MG disintegrating tablet 4mg  ODT q4 hours prn nausea/vomit 03/26/23  Yes Barbara Plan, DO  albuterol (PROVENTIL HFA;VENTOLIN HFA) 108 (90 Base) MCG/ACT inhaler Inhale 2 puffs into the lungs every 6 (six) hours as needed for wheezing or shortness of breath. 05/30/16   Hedges, Barbara Gens, Alvarez-C  albuterol (PROVENTIL) (2.5 MG/3ML) 0.083% nebulizer solution Take 3 mLs (2.5 mg total) by nebulization every 6 (six) hours as needed for wheezing or shortness of breath. 05/30/16   Hedges, Barbara Gens, Alvarez-C  cetirizine (ZYRTEC) 10 MG tablet Take 1 tablet (10 mg total) by mouth daily. 12/17/17   Barbara Fossa, MD  diazepam (VALIUM) 5 MG tablet Take 5 mg at MRI scanner and may repeat one time after 30 minutes. Patient taking differently: Take 5 mg by mouth daily as needed for anxiety. 03/18/20   Barbara Champagne, MD  hydrOXYzine (ATARAX/VISTARIL) 25 MG tablet Take 1-2 tablets (25-50 mg total) by mouth every 6 (six) hours as needed for itching. 09/20/16   Barbara Neighbors, NP  ibuprofen (ADVIL) 200 MG tablet Take 200 mg by mouth every 6 (six) hours as needed for  moderate pain.    [provider]  lisinopril (PRINIVIL,ZESTRIL) 20 MG tablet Take 1 tablet (20 mg total) by mouth daily. Patient not taking: Reported on 04/23/2020 11/20/16   Barbara Neighbors, NP  pregabalin (LYRICA) 75 MG capsule Take 1 capsule (75 mg total) by mouth 2 (two) times daily. Patient not taking: Reported on 04/23/2020 08/21/19   Barbara Champagne, MD  risperiDONE (RISPERDAL) 0.5 MG tablet Take 0.5 mg by mouth 2 (two) times daily.     [provider]  tiZANidine (ZANAFLEX) 4 MG tablet Take 1 tablet (4 mg total) by mouth every 6 (six) hours as needed for muscle spasms. 08/16/17   Barbara Born, Alvarez-C      Allergies    Haloperidol decanoate, Tramadol, Flexeril [cyclobenzaprine], and Naproxen    Review of Systems   Review of Systems  Respiratory:  Positive for cough.     Physical Exam Updated Vital Signs BP (!) 96/57   Pulse 64   Temp 98.1 F (36.7 C) (Oral)   Resp 18   LMP 08/21/2016   SpO2 97%  Physical Exam Vitals and nursing note reviewed.  Constitutional:      General: She is not in acute distress.    Appearance: She is well-developed. She is not diaphoretic.  HENT:     Head: Normocephalic and atraumatic.     Comments: Swollen turbinates, posterior nasal drip, left  frontal sinuses exquisitely tender to percussion.  Left TM with effusion and bulging Eyes:     Pupils: Pupils are equal, round, and reactive to light.  Cardiovascular:     Rate and Rhythm: Normal rate and regular rhythm.     Heart sounds: No murmur heard.    No friction rub. No gallop.  Pulmonary:     Effort: Pulmonary effort is normal.     Breath sounds: No wheezing or rales.     Comments: Coarse breath sounds in all fields. Abdominal:     General: There is no distension.     Palpations: Abdomen is soft.     Tenderness: There is no abdominal tenderness.  Musculoskeletal:        General: No tenderness.     Cervical back: Normal range of motion and neck supple.  Skin:     General: Skin is warm and dry.  Neurological:     Mental Status: She is alert and oriented to person, place, and time.  Psychiatric:        Behavior: Behavior normal.     ED Results / Procedures / Treatments   Labs (all labs ordered are listed, but only abnormal results are displayed) Labs Reviewed  RESP PANEL BY RT-PCR (RSV, FLU A&B, COVID)  [id number]  CBC  COMPREHENSIVE METABOLIC PANEL    EKG None  Radiology DG Chest 2 View  Result Date: 03/26/2023 CLINICAL DATA:  Several day history of cough and nasal congestion EXAM: CHEST - 2 VIEW COMPARISON:  Chest radiograph dated 04/23/2020 FINDINGS: Normal lung volumes. Bilateral lower lobe peribronchial wall thickening. No pleural effusion or pneumothorax. The heart size and mediastinal contours are within normal limits. No acute osseous abnormality. Right upper quadrant surgical clips. IMPRESSION: Bilateral lower lobe peribronchial wall thickening, which can be seen in the setting of bronchitis. No focal consolidations. Electronically Signed   By: Agustin Cree M.D.   On: 03/26/2023 14:08    Procedures Procedures  Discussed smoking cessation with patient and was they were offerred resources to help stop.  Total time was 5 min CPT code 08657.     Medications Ordered in ED Medications  AeroChamber Plus Flo-Vu Large MISC (has no administration in time range)  amoxicillin-clavulanate (AUGMENTIN) 875-125 MG per tablet 1 tablet (1 tablet Oral Given 03/26/23 1308)  albuterol (VENTOLIN HFA) 108 (90 Base) MCG/ACT inhaler 2 puff (2 puffs Inhalation Given 03/26/23 1308)  aerochamber plus with mask device 1 each (1 each Other Given 03/26/23 1312)  dexamethasone (DECADRON) tablet 10 mg (10 mg Oral Given 03/26/23 1308)    ED Course/ Medical Decision Making/ A&P                                 Medical Decision Making Amount and/or Complexity of Data Reviewed Labs: ordered. Radiology: ordered.  Risk Prescription drug management.   57 yo F  with a chief complaint of an upper respiratory illness.  Going on for couple days.  Well-appearing nontoxic.  Has lung sounds consistent with bronchitis.  She is active every day smoker.  Will give a dose of Decadron albuterol inhaler for home.  Lab work without anemia, no significant electrolyte abnormalities.  COVID and flu are negative.  Chest x-ray independently interpreted by me with no focal infiltrates or pneumothorax.  Clinically she has sinusitis.  Will start on antibiotics.  PCP follow-up.  3:50 PM:  I have discussed the diagnosis/risks/treatment options with  the patient.  Evaluation and diagnostic testing in the emergency department does not suggest an emergent condition requiring admission or immediate intervention beyond what has been performed at this time.  They will follow up with PCP. We also discussed returning to the ED immediately if new or worsening sx occur. We discussed the sx which are most concerning (e.g., sudden worsening pain, fever, inability to tolerate by mouth) that necessitate immediate return. Medications administered to the patient during their visit and any new prescriptions provided to the patient are listed below.  Medications given during this visit Medications  AeroChamber Plus Flo-Vu Large MISC (has no administration in time range)  amoxicillin-clavulanate (AUGMENTIN) 875-125 MG per tablet 1 tablet (1 tablet Oral Given 03/26/23 1308)  albuterol (VENTOLIN HFA) 108 (90 Base) MCG/ACT inhaler 2 puff (2 puffs Inhalation Given 03/26/23 1308)  aerochamber plus with mask device 1 each (1 each Other Given 03/26/23 1312)  dexamethasone (DECADRON) tablet 10 mg (10 mg Oral Given 03/26/23 1308)     The patient appears reasonably screen and/or stabilized for discharge and I doubt any other medical condition or other [facility] requiring further screening, evaluation, or treatment in the ED at this time prior to discharge.          Final Clinical Impression(s) / ED  Diagnoses Final diagnoses:  Viral URI with cough    Rx / DC Orders ED Discharge Orders          Ordered    amoxicillin-clavulanate (AUGMENTIN) 875-125 MG tablet  Every 12 hours        03/26/23 1252    benzonatate (TESSALON) 100 MG capsule  Every 8 hours        03/26/23 1253    ondansetron (ZOFRAN-ODT) 4 MG disintegrating tablet        03/26/23 1253              Barbara Plan, DO 03/26/23 1550

## 2023-03-26 NOTE — Discharge Instructions (Signed)
Take tylenol 2 pills 4 times a day and motrin 4 pills 3 times a day.  Drink plenty of fluids.  Return for worsening shortness of breath, headache, confusion. Follow up with your family doctor.   You can use the inhaler up to 2 puffs every 4 hours while you are awake.  If you need to use it more often this than you need to return to the emergency department for repeat evaluation.
